# Patient Record
Sex: Male | Born: 1935 | Race: White | Hispanic: No | Marital: Married | Smoking: Never smoker
Health system: Southern US, Community
[De-identification: ages and names within clinical notes are randomized; demographics above are authoritative.]

## PROBLEM LIST (undated history)

## (undated) DIAGNOSIS — I872 Venous insufficiency (chronic) (peripheral): Secondary | ICD-10-CM

## (undated) DIAGNOSIS — I2510 Atherosclerotic heart disease of native coronary artery without angina pectoris: Secondary | ICD-10-CM

## (undated) DIAGNOSIS — I82409 Acute embolism and thrombosis of unspecified deep veins of unspecified lower extremity: Secondary | ICD-10-CM

## (undated) DIAGNOSIS — Z79899 Other long term (current) drug therapy: Secondary | ICD-10-CM

## (undated) DIAGNOSIS — M109 Gout, unspecified: Secondary | ICD-10-CM

## (undated) DIAGNOSIS — D72829 Elevated white blood cell count, unspecified: Secondary | ICD-10-CM

## (undated) DIAGNOSIS — M1990 Unspecified osteoarthritis, unspecified site: Secondary | ICD-10-CM

## (undated) DIAGNOSIS — R3915 Urgency of urination: Secondary | ICD-10-CM

## (undated) DIAGNOSIS — Z951 Presence of aortocoronary bypass graft: Secondary | ICD-10-CM

## (undated) DIAGNOSIS — E785 Hyperlipidemia, unspecified: Secondary | ICD-10-CM

## (undated) DIAGNOSIS — R0600 Dyspnea, unspecified: Secondary | ICD-10-CM

## (undated) DIAGNOSIS — J90 Pleural effusion, not elsewhere classified: Secondary | ICD-10-CM

## (undated) DIAGNOSIS — I10 Essential (primary) hypertension: Secondary | ICD-10-CM

## (undated) DIAGNOSIS — N189 Chronic kidney disease, unspecified: Secondary | ICD-10-CM

## (undated) DIAGNOSIS — E669 Obesity, unspecified: Secondary | ICD-10-CM

## (undated) DIAGNOSIS — D638 Anemia in other chronic diseases classified elsewhere: Secondary | ICD-10-CM

## (undated) DIAGNOSIS — E538 Deficiency of other specified B group vitamins: Secondary | ICD-10-CM

## (undated) DIAGNOSIS — R351 Nocturia: Secondary | ICD-10-CM

## (undated) DIAGNOSIS — I259 Chronic ischemic heart disease, unspecified: Secondary | ICD-10-CM

## (undated) DIAGNOSIS — R350 Frequency of micturition: Secondary | ICD-10-CM

## (undated) DIAGNOSIS — D649 Anemia, unspecified: Secondary | ICD-10-CM

## (undated) DIAGNOSIS — C439 Malignant melanoma of skin, unspecified: Secondary | ICD-10-CM

## (undated) DIAGNOSIS — I444 Left anterior fascicular block: Secondary | ICD-10-CM

## (undated) DIAGNOSIS — I25118 Atherosclerotic heart disease of native coronary artery with other forms of angina pectoris: Secondary | ICD-10-CM

## (undated) DIAGNOSIS — I4510 Unspecified right bundle-branch block: Secondary | ICD-10-CM

## (undated) DIAGNOSIS — I480 Paroxysmal atrial fibrillation: Secondary | ICD-10-CM

## (undated) DIAGNOSIS — I493 Ventricular premature depolarization: Secondary | ICD-10-CM

## (undated) DIAGNOSIS — R9439 Abnormal result of other cardiovascular function study: Secondary | ICD-10-CM

## (undated) HISTORY — DX: Atherosclerotic heart disease of native coronary artery without angina pectoris: I25.10

## (undated) HISTORY — DX: Paroxysmal atrial fibrillation: I48.0

## (undated) HISTORY — PX: CORNEAL TRANSPLANT: SHX108

## (undated) HISTORY — DX: Essential (primary) hypertension: I10

## (undated) HISTORY — DX: Presence of aortocoronary bypass graft: Z95.1

## (undated) HISTORY — PX: COLONOSCOPY: SHX174

## (undated) HISTORY — PX: FOOT SURGERY: SHX648

## (undated) HISTORY — DX: Chronic kidney disease, unspecified: N18.9

## (undated) HISTORY — DX: Anemia in other chronic diseases classified elsewhere: D63.8

## (undated) HISTORY — DX: Gout, unspecified: M10.9

## (undated) HISTORY — DX: Unspecified osteoarthritis, unspecified site: M19.90

## (undated) HISTORY — DX: Malignant melanoma of skin, unspecified: C43.9

## (undated) HISTORY — DX: Elevated white blood cell count, unspecified: D72.829

## (undated) HISTORY — DX: Obesity, unspecified: E66.9

## (undated) HISTORY — DX: Ventricular premature depolarization: I49.3

## (undated) HISTORY — DX: Pleural effusion, not elsewhere classified: J90

## (undated) HISTORY — DX: Hyperlipidemia, unspecified: E78.5

## (undated) HISTORY — DX: Left anterior fascicular block: I44.4

## (undated) HISTORY — DX: Atherosclerotic heart disease of native coronary artery with other forms of angina pectoris: I25.118

## (undated) HISTORY — DX: Abnormal result of other cardiovascular function study: R94.39

## (undated) HISTORY — PX: CARDIAC CATHETERIZATION: SHX172

## (undated) HISTORY — DX: Other long term (current) drug therapy: Z79.899

---

## 2011-08-05 DIAGNOSIS — I10 Essential (primary) hypertension: Secondary | ICD-10-CM | POA: Diagnosis not present

## 2011-08-05 DIAGNOSIS — E785 Hyperlipidemia, unspecified: Secondary | ICD-10-CM | POA: Diagnosis not present

## 2011-08-10 DIAGNOSIS — I10 Essential (primary) hypertension: Secondary | ICD-10-CM | POA: Diagnosis not present

## 2011-08-10 DIAGNOSIS — I4949 Other premature depolarization: Secondary | ICD-10-CM | POA: Diagnosis not present

## 2011-08-10 DIAGNOSIS — E785 Hyperlipidemia, unspecified: Secondary | ICD-10-CM | POA: Diagnosis not present

## 2011-08-19 DIAGNOSIS — Z947 Corneal transplant status: Secondary | ICD-10-CM | POA: Diagnosis not present

## 2011-10-28 DIAGNOSIS — D179 Benign lipomatous neoplasm, unspecified: Secondary | ICD-10-CM | POA: Diagnosis not present

## 2011-10-28 DIAGNOSIS — M109 Gout, unspecified: Secondary | ICD-10-CM | POA: Diagnosis not present

## 2011-11-17 DIAGNOSIS — D1739 Benign lipomatous neoplasm of skin and subcutaneous tissue of other sites: Secondary | ICD-10-CM | POA: Diagnosis not present

## 2011-11-25 DIAGNOSIS — R229 Localized swelling, mass and lump, unspecified: Secondary | ICD-10-CM | POA: Diagnosis not present

## 2011-11-25 DIAGNOSIS — M6740 Ganglion, unspecified site: Secondary | ICD-10-CM | POA: Diagnosis not present

## 2012-01-06 DIAGNOSIS — M1710 Unilateral primary osteoarthritis, unspecified knee: Secondary | ICD-10-CM | POA: Diagnosis not present

## 2012-01-06 DIAGNOSIS — M1A00X0 Idiopathic chronic gout, unspecified site, without tophus (tophi): Secondary | ICD-10-CM | POA: Diagnosis not present

## 2012-05-08 DIAGNOSIS — L570 Actinic keratosis: Secondary | ICD-10-CM | POA: Diagnosis not present

## 2012-05-08 DIAGNOSIS — L821 Other seborrheic keratosis: Secondary | ICD-10-CM | POA: Diagnosis not present

## 2012-05-08 DIAGNOSIS — Z23 Encounter for immunization: Secondary | ICD-10-CM | POA: Diagnosis not present

## 2012-07-06 DIAGNOSIS — I10 Essential (primary) hypertension: Secondary | ICD-10-CM | POA: Diagnosis not present

## 2012-07-10 DIAGNOSIS — E785 Hyperlipidemia, unspecified: Secondary | ICD-10-CM | POA: Diagnosis not present

## 2012-07-10 DIAGNOSIS — I4949 Other premature depolarization: Secondary | ICD-10-CM | POA: Diagnosis not present

## 2012-07-10 DIAGNOSIS — N529 Male erectile dysfunction, unspecified: Secondary | ICD-10-CM | POA: Diagnosis not present

## 2012-07-10 DIAGNOSIS — I4460 Unspecified fascicular block: Secondary | ICD-10-CM | POA: Diagnosis not present

## 2012-07-10 DIAGNOSIS — I10 Essential (primary) hypertension: Secondary | ICD-10-CM | POA: Diagnosis not present

## 2012-07-11 DIAGNOSIS — M19049 Primary osteoarthritis, unspecified hand: Secondary | ICD-10-CM | POA: Diagnosis not present

## 2012-07-11 DIAGNOSIS — M719 Bursopathy, unspecified: Secondary | ICD-10-CM | POA: Diagnosis not present

## 2012-07-11 DIAGNOSIS — M1710 Unilateral primary osteoarthritis, unspecified knee: Secondary | ICD-10-CM | POA: Diagnosis not present

## 2012-07-11 DIAGNOSIS — M1A00X0 Idiopathic chronic gout, unspecified site, without tophus (tophi): Secondary | ICD-10-CM | POA: Diagnosis not present

## 2012-08-17 DIAGNOSIS — Z947 Corneal transplant status: Secondary | ICD-10-CM | POA: Diagnosis not present

## 2012-10-24 DIAGNOSIS — S51809A Unspecified open wound of unspecified forearm, initial encounter: Secondary | ICD-10-CM | POA: Diagnosis not present

## 2012-11-09 DIAGNOSIS — M79609 Pain in unspecified limb: Secondary | ICD-10-CM | POA: Diagnosis not present

## 2013-01-09 DIAGNOSIS — M19049 Primary osteoarthritis, unspecified hand: Secondary | ICD-10-CM | POA: Diagnosis not present

## 2013-01-09 DIAGNOSIS — M719 Bursopathy, unspecified: Secondary | ICD-10-CM | POA: Diagnosis not present

## 2013-01-09 DIAGNOSIS — M1A00X0 Idiopathic chronic gout, unspecified site, without tophus (tophi): Secondary | ICD-10-CM | POA: Diagnosis not present

## 2013-01-09 DIAGNOSIS — M67919 Unspecified disorder of synovium and tendon, unspecified shoulder: Secondary | ICD-10-CM | POA: Diagnosis not present

## 2013-01-09 DIAGNOSIS — M1710 Unilateral primary osteoarthritis, unspecified knee: Secondary | ICD-10-CM | POA: Diagnosis not present

## 2013-04-13 DIAGNOSIS — Z23 Encounter for immunization: Secondary | ICD-10-CM | POA: Diagnosis not present

## 2013-05-08 DIAGNOSIS — Z85820 Personal history of malignant melanoma of skin: Secondary | ICD-10-CM | POA: Diagnosis not present

## 2013-05-08 DIAGNOSIS — L821 Other seborrheic keratosis: Secondary | ICD-10-CM | POA: Diagnosis not present

## 2013-05-08 DIAGNOSIS — L570 Actinic keratosis: Secondary | ICD-10-CM | POA: Diagnosis not present

## 2014-01-21 DIAGNOSIS — E785 Hyperlipidemia, unspecified: Secondary | ICD-10-CM | POA: Diagnosis not present

## 2014-01-22 DIAGNOSIS — M1A00X0 Idiopathic chronic gout, unspecified site, without tophus (tophi): Secondary | ICD-10-CM | POA: Diagnosis not present

## 2014-01-22 DIAGNOSIS — IMO0002 Reserved for concepts with insufficient information to code with codable children: Secondary | ICD-10-CM | POA: Diagnosis not present

## 2014-01-22 DIAGNOSIS — E785 Hyperlipidemia, unspecified: Secondary | ICD-10-CM | POA: Diagnosis not present

## 2014-01-22 DIAGNOSIS — M19049 Primary osteoarthritis, unspecified hand: Secondary | ICD-10-CM | POA: Diagnosis not present

## 2014-01-22 DIAGNOSIS — I10 Essential (primary) hypertension: Secondary | ICD-10-CM | POA: Diagnosis not present

## 2014-01-22 DIAGNOSIS — M67919 Unspecified disorder of synovium and tendon, unspecified shoulder: Secondary | ICD-10-CM | POA: Diagnosis not present

## 2014-01-22 DIAGNOSIS — M1710 Unilateral primary osteoarthritis, unspecified knee: Secondary | ICD-10-CM | POA: Diagnosis not present

## 2014-04-22 DIAGNOSIS — H1130 Conjunctival hemorrhage, unspecified eye: Secondary | ICD-10-CM | POA: Diagnosis not present

## 2014-05-21 DIAGNOSIS — Z23 Encounter for immunization: Secondary | ICD-10-CM | POA: Diagnosis not present

## 2014-05-27 DIAGNOSIS — H43813 Vitreous degeneration, bilateral: Secondary | ICD-10-CM | POA: Diagnosis not present

## 2014-06-24 DIAGNOSIS — Z125 Encounter for screening for malignant neoplasm of prostate: Secondary | ICD-10-CM | POA: Diagnosis not present

## 2014-06-24 DIAGNOSIS — I10 Essential (primary) hypertension: Secondary | ICD-10-CM | POA: Diagnosis not present

## 2014-06-25 DIAGNOSIS — D485 Neoplasm of uncertain behavior of skin: Secondary | ICD-10-CM | POA: Diagnosis not present

## 2014-06-25 DIAGNOSIS — L821 Other seborrheic keratosis: Secondary | ICD-10-CM | POA: Diagnosis not present

## 2014-06-25 DIAGNOSIS — L814 Other melanin hyperpigmentation: Secondary | ICD-10-CM | POA: Diagnosis not present

## 2014-06-28 DIAGNOSIS — Z0000 Encounter for general adult medical examination without abnormal findings: Secondary | ICD-10-CM | POA: Diagnosis not present

## 2014-06-28 DIAGNOSIS — E780 Pure hypercholesterolemia: Secondary | ICD-10-CM | POA: Diagnosis not present

## 2014-06-28 DIAGNOSIS — I10 Essential (primary) hypertension: Secondary | ICD-10-CM | POA: Diagnosis not present

## 2014-06-28 DIAGNOSIS — M1000 Idiopathic gout, unspecified site: Secondary | ICD-10-CM | POA: Diagnosis not present

## 2014-06-28 DIAGNOSIS — Z125 Encounter for screening for malignant neoplasm of prostate: Secondary | ICD-10-CM | POA: Diagnosis not present

## 2014-06-28 DIAGNOSIS — Z23 Encounter for immunization: Secondary | ICD-10-CM | POA: Diagnosis not present

## 2014-08-21 DIAGNOSIS — Z86010 Personal history of colonic polyps: Secondary | ICD-10-CM | POA: Diagnosis not present

## 2014-08-21 DIAGNOSIS — K5790 Diverticulosis of intestine, part unspecified, without perforation or abscess without bleeding: Secondary | ICD-10-CM | POA: Diagnosis not present

## 2014-08-21 DIAGNOSIS — Z1211 Encounter for screening for malignant neoplasm of colon: Secondary | ICD-10-CM | POA: Diagnosis not present

## 2015-05-02 DIAGNOSIS — Z23 Encounter for immunization: Secondary | ICD-10-CM | POA: Diagnosis not present

## 2015-06-02 DIAGNOSIS — H43813 Vitreous degeneration, bilateral: Secondary | ICD-10-CM | POA: Diagnosis not present

## 2015-07-02 DIAGNOSIS — M216X1 Other acquired deformities of right foot: Secondary | ICD-10-CM | POA: Diagnosis not present

## 2015-07-02 DIAGNOSIS — L84 Corns and callosities: Secondary | ICD-10-CM | POA: Diagnosis not present

## 2015-07-02 DIAGNOSIS — M21611 Bunion of right foot: Secondary | ICD-10-CM | POA: Diagnosis not present

## 2015-07-17 DIAGNOSIS — D485 Neoplasm of uncertain behavior of skin: Secondary | ICD-10-CM | POA: Diagnosis not present

## 2015-07-17 DIAGNOSIS — I10 Essential (primary) hypertension: Secondary | ICD-10-CM | POA: Diagnosis not present

## 2015-07-17 DIAGNOSIS — L820 Inflamed seborrheic keratosis: Secondary | ICD-10-CM | POA: Diagnosis not present

## 2015-07-22 DIAGNOSIS — I10 Essential (primary) hypertension: Secondary | ICD-10-CM | POA: Insufficient documentation

## 2015-07-22 DIAGNOSIS — E785 Hyperlipidemia, unspecified: Secondary | ICD-10-CM

## 2015-07-22 HISTORY — DX: Essential (primary) hypertension: I10

## 2015-07-22 HISTORY — DX: Hyperlipidemia, unspecified: E78.5

## 2015-07-23 DIAGNOSIS — Z6824 Body mass index (BMI) 24.0-24.9, adult: Secondary | ICD-10-CM | POA: Diagnosis not present

## 2015-07-23 DIAGNOSIS — I10 Essential (primary) hypertension: Secondary | ICD-10-CM | POA: Diagnosis not present

## 2015-07-23 DIAGNOSIS — E785 Hyperlipidemia, unspecified: Secondary | ICD-10-CM | POA: Diagnosis not present

## 2015-07-23 DIAGNOSIS — R0602 Shortness of breath: Secondary | ICD-10-CM | POA: Diagnosis not present

## 2015-07-27 DIAGNOSIS — R0600 Dyspnea, unspecified: Secondary | ICD-10-CM | POA: Insufficient documentation

## 2015-07-27 HISTORY — DX: Dyspnea, unspecified: R06.00

## 2015-07-29 DIAGNOSIS — C44519 Basal cell carcinoma of skin of other part of trunk: Secondary | ICD-10-CM | POA: Diagnosis not present

## 2015-08-11 DIAGNOSIS — E785 Hyperlipidemia, unspecified: Secondary | ICD-10-CM | POA: Diagnosis not present

## 2015-08-11 DIAGNOSIS — I10 Essential (primary) hypertension: Secondary | ICD-10-CM | POA: Diagnosis not present

## 2015-08-11 DIAGNOSIS — R0602 Shortness of breath: Secondary | ICD-10-CM | POA: Diagnosis not present

## 2015-08-16 DIAGNOSIS — R9439 Abnormal result of other cardiovascular function study: Secondary | ICD-10-CM

## 2015-08-16 HISTORY — DX: Abnormal result of other cardiovascular function study: R94.39

## 2015-08-18 DIAGNOSIS — I700 Atherosclerosis of aorta: Secondary | ICD-10-CM | POA: Diagnosis not present

## 2015-08-18 DIAGNOSIS — Z6824 Body mass index (BMI) 24.0-24.9, adult: Secondary | ICD-10-CM | POA: Diagnosis not present

## 2015-08-18 DIAGNOSIS — Z01810 Encounter for preprocedural cardiovascular examination: Secondary | ICD-10-CM | POA: Diagnosis not present

## 2015-08-18 DIAGNOSIS — R9439 Abnormal result of other cardiovascular function study: Secondary | ICD-10-CM | POA: Diagnosis not present

## 2015-08-18 DIAGNOSIS — Z01818 Encounter for other preprocedural examination: Secondary | ICD-10-CM | POA: Diagnosis not present

## 2015-08-18 DIAGNOSIS — R0602 Shortness of breath: Secondary | ICD-10-CM | POA: Diagnosis not present

## 2015-08-21 DIAGNOSIS — I10 Essential (primary) hypertension: Secondary | ICD-10-CM | POA: Diagnosis not present

## 2015-08-21 DIAGNOSIS — I2510 Atherosclerotic heart disease of native coronary artery without angina pectoris: Secondary | ICD-10-CM | POA: Diagnosis not present

## 2015-08-21 DIAGNOSIS — I444 Left anterior fascicular block: Secondary | ICD-10-CM | POA: Diagnosis not present

## 2015-08-21 DIAGNOSIS — Z7982 Long term (current) use of aspirin: Secondary | ICD-10-CM | POA: Diagnosis not present

## 2015-08-21 DIAGNOSIS — Z9889 Other specified postprocedural states: Secondary | ICD-10-CM | POA: Diagnosis not present

## 2015-08-21 DIAGNOSIS — E785 Hyperlipidemia, unspecified: Secondary | ICD-10-CM | POA: Diagnosis not present

## 2015-08-21 DIAGNOSIS — I501 Left ventricular failure: Secondary | ICD-10-CM | POA: Diagnosis not present

## 2015-08-21 DIAGNOSIS — I25119 Atherosclerotic heart disease of native coronary artery with unspecified angina pectoris: Secondary | ICD-10-CM | POA: Diagnosis not present

## 2015-08-21 DIAGNOSIS — Z79899 Other long term (current) drug therapy: Secondary | ICD-10-CM | POA: Diagnosis not present

## 2015-08-21 DIAGNOSIS — R9439 Abnormal result of other cardiovascular function study: Secondary | ICD-10-CM | POA: Diagnosis not present

## 2015-08-21 DIAGNOSIS — I493 Ventricular premature depolarization: Secondary | ICD-10-CM | POA: Diagnosis not present

## 2015-08-21 DIAGNOSIS — R0602 Shortness of breath: Secondary | ICD-10-CM | POA: Diagnosis not present

## 2015-08-21 DIAGNOSIS — M109 Gout, unspecified: Secondary | ICD-10-CM | POA: Diagnosis not present

## 2015-08-22 DIAGNOSIS — I6523 Occlusion and stenosis of bilateral carotid arteries: Secondary | ICD-10-CM | POA: Diagnosis not present

## 2015-08-22 DIAGNOSIS — I2510 Atherosclerotic heart disease of native coronary artery without angina pectoris: Secondary | ICD-10-CM | POA: Diagnosis not present

## 2015-08-22 DIAGNOSIS — M5134 Other intervertebral disc degeneration, thoracic region: Secondary | ICD-10-CM | POA: Diagnosis not present

## 2015-08-22 DIAGNOSIS — Z01818 Encounter for other preprocedural examination: Secondary | ICD-10-CM | POA: Diagnosis not present

## 2015-08-22 DIAGNOSIS — I081 Rheumatic disorders of both mitral and tricuspid valves: Secondary | ICD-10-CM | POA: Diagnosis not present

## 2015-08-22 DIAGNOSIS — Z01812 Encounter for preprocedural laboratory examination: Secondary | ICD-10-CM | POA: Diagnosis not present

## 2015-08-26 DIAGNOSIS — I4891 Unspecified atrial fibrillation: Secondary | ICD-10-CM | POA: Diagnosis not present

## 2015-08-26 DIAGNOSIS — I480 Paroxysmal atrial fibrillation: Secondary | ICD-10-CM | POA: Diagnosis not present

## 2015-08-26 DIAGNOSIS — E669 Obesity, unspecified: Secondary | ICD-10-CM | POA: Diagnosis present

## 2015-08-26 DIAGNOSIS — R001 Bradycardia, unspecified: Secondary | ICD-10-CM | POA: Diagnosis not present

## 2015-08-26 DIAGNOSIS — J90 Pleural effusion, not elsewhere classified: Secondary | ICD-10-CM | POA: Diagnosis not present

## 2015-08-26 DIAGNOSIS — Z7901 Long term (current) use of anticoagulants: Secondary | ICD-10-CM | POA: Diagnosis not present

## 2015-08-26 DIAGNOSIS — I25118 Atherosclerotic heart disease of native coronary artery with other forms of angina pectoris: Secondary | ICD-10-CM | POA: Diagnosis not present

## 2015-08-26 DIAGNOSIS — M109 Gout, unspecified: Secondary | ICD-10-CM | POA: Diagnosis present

## 2015-08-26 DIAGNOSIS — I499 Cardiac arrhythmia, unspecified: Secondary | ICD-10-CM | POA: Diagnosis not present

## 2015-08-26 DIAGNOSIS — I081 Rheumatic disorders of both mitral and tricuspid valves: Secondary | ICD-10-CM | POA: Diagnosis not present

## 2015-08-26 DIAGNOSIS — Z951 Presence of aortocoronary bypass graft: Secondary | ICD-10-CM | POA: Diagnosis not present

## 2015-08-26 DIAGNOSIS — R609 Edema, unspecified: Secondary | ICD-10-CM | POA: Diagnosis not present

## 2015-08-26 DIAGNOSIS — R5383 Other fatigue: Secondary | ICD-10-CM | POA: Diagnosis not present

## 2015-08-26 DIAGNOSIS — R0609 Other forms of dyspnea: Secondary | ICD-10-CM | POA: Diagnosis not present

## 2015-08-26 DIAGNOSIS — I517 Cardiomegaly: Secondary | ICD-10-CM | POA: Diagnosis not present

## 2015-08-26 DIAGNOSIS — I10 Essential (primary) hypertension: Secondary | ICD-10-CM | POA: Diagnosis not present

## 2015-08-26 DIAGNOSIS — Z947 Corneal transplant status: Secondary | ICD-10-CM | POA: Diagnosis not present

## 2015-08-26 DIAGNOSIS — M1990 Unspecified osteoarthritis, unspecified site: Secondary | ICD-10-CM | POA: Diagnosis present

## 2015-08-26 DIAGNOSIS — R079 Chest pain, unspecified: Secondary | ICD-10-CM | POA: Diagnosis not present

## 2015-08-26 DIAGNOSIS — I25119 Atherosclerotic heart disease of native coronary artery with unspecified angina pectoris: Secondary | ICD-10-CM | POA: Diagnosis not present

## 2015-08-26 DIAGNOSIS — Z6828 Body mass index (BMI) 28.0-28.9, adult: Secondary | ICD-10-CM | POA: Diagnosis not present

## 2015-08-26 DIAGNOSIS — Z6827 Body mass index (BMI) 27.0-27.9, adult: Secondary | ICD-10-CM | POA: Diagnosis not present

## 2015-08-26 DIAGNOSIS — I2510 Atherosclerotic heart disease of native coronary artery without angina pectoris: Secondary | ICD-10-CM | POA: Diagnosis not present

## 2015-08-26 DIAGNOSIS — Z85820 Personal history of malignant melanoma of skin: Secondary | ICD-10-CM | POA: Diagnosis not present

## 2015-08-26 DIAGNOSIS — E785 Hyperlipidemia, unspecified: Secondary | ICD-10-CM | POA: Diagnosis not present

## 2015-08-26 HISTORY — PX: CORONARY ARTERY BYPASS GRAFT: SHX141

## 2015-09-01 DIAGNOSIS — M1990 Unspecified osteoarthritis, unspecified site: Secondary | ICD-10-CM | POA: Diagnosis present

## 2015-09-01 DIAGNOSIS — M109 Gout, unspecified: Secondary | ICD-10-CM | POA: Diagnosis present

## 2015-09-01 DIAGNOSIS — N179 Acute kidney failure, unspecified: Secondary | ICD-10-CM | POA: Diagnosis not present

## 2015-09-01 DIAGNOSIS — I25118 Atherosclerotic heart disease of native coronary artery with other forms of angina pectoris: Secondary | ICD-10-CM

## 2015-09-01 DIAGNOSIS — I2510 Atherosclerotic heart disease of native coronary artery without angina pectoris: Secondary | ICD-10-CM | POA: Diagnosis not present

## 2015-09-01 DIAGNOSIS — I480 Paroxysmal atrial fibrillation: Secondary | ICD-10-CM | POA: Insufficient documentation

## 2015-09-01 DIAGNOSIS — I10 Essential (primary) hypertension: Secondary | ICD-10-CM | POA: Diagnosis not present

## 2015-09-01 DIAGNOSIS — E785 Hyperlipidemia, unspecified: Secondary | ICD-10-CM | POA: Diagnosis not present

## 2015-09-01 DIAGNOSIS — Z48812 Encounter for surgical aftercare following surgery on the circulatory system: Secondary | ICD-10-CM | POA: Diagnosis not present

## 2015-09-01 DIAGNOSIS — Z79899 Other long term (current) drug therapy: Secondary | ICD-10-CM

## 2015-09-01 DIAGNOSIS — K219 Gastro-esophageal reflux disease without esophagitis: Secondary | ICD-10-CM | POA: Diagnosis present

## 2015-09-01 DIAGNOSIS — D649 Anemia, unspecified: Secondary | ICD-10-CM | POA: Diagnosis not present

## 2015-09-01 DIAGNOSIS — J90 Pleural effusion, not elsewhere classified: Secondary | ICD-10-CM | POA: Diagnosis not present

## 2015-09-01 DIAGNOSIS — D72829 Elevated white blood cell count, unspecified: Secondary | ICD-10-CM | POA: Diagnosis not present

## 2015-09-01 DIAGNOSIS — E669 Obesity, unspecified: Secondary | ICD-10-CM | POA: Diagnosis present

## 2015-09-01 DIAGNOSIS — Z6825 Body mass index (BMI) 25.0-25.9, adult: Secondary | ICD-10-CM | POA: Diagnosis not present

## 2015-09-01 DIAGNOSIS — Z951 Presence of aortocoronary bypass graft: Secondary | ICD-10-CM | POA: Diagnosis not present

## 2015-09-01 HISTORY — DX: Paroxysmal atrial fibrillation: I48.0

## 2015-09-01 HISTORY — DX: Atherosclerotic heart disease of native coronary artery with other forms of angina pectoris: I25.118

## 2015-09-01 HISTORY — DX: Other long term (current) drug therapy: Z79.899

## 2015-09-04 DIAGNOSIS — J90 Pleural effusion, not elsewhere classified: Secondary | ICD-10-CM | POA: Diagnosis not present

## 2015-09-05 DIAGNOSIS — N179 Acute kidney failure, unspecified: Secondary | ICD-10-CM | POA: Diagnosis not present

## 2015-09-10 DIAGNOSIS — I2510 Atherosclerotic heart disease of native coronary artery without angina pectoris: Secondary | ICD-10-CM | POA: Diagnosis present

## 2015-09-10 DIAGNOSIS — J90 Pleural effusion, not elsewhere classified: Secondary | ICD-10-CM | POA: Diagnosis not present

## 2015-09-10 DIAGNOSIS — I517 Cardiomegaly: Secondary | ICD-10-CM | POA: Diagnosis not present

## 2015-09-10 DIAGNOSIS — M1990 Unspecified osteoarthritis, unspecified site: Secondary | ICD-10-CM | POA: Diagnosis present

## 2015-09-10 DIAGNOSIS — I480 Paroxysmal atrial fibrillation: Secondary | ICD-10-CM | POA: Diagnosis not present

## 2015-09-10 DIAGNOSIS — J9811 Atelectasis: Secondary | ICD-10-CM | POA: Diagnosis not present

## 2015-09-10 DIAGNOSIS — M109 Gout, unspecified: Secondary | ICD-10-CM | POA: Diagnosis present

## 2015-09-10 DIAGNOSIS — Z79899 Other long term (current) drug therapy: Secondary | ICD-10-CM | POA: Diagnosis not present

## 2015-09-10 DIAGNOSIS — E669 Obesity, unspecified: Secondary | ICD-10-CM | POA: Diagnosis not present

## 2015-09-10 DIAGNOSIS — I25718 Atherosclerosis of autologous vein coronary artery bypass graft(s) with other forms of angina pectoris: Secondary | ICD-10-CM | POA: Diagnosis not present

## 2015-09-10 DIAGNOSIS — Z951 Presence of aortocoronary bypass graft: Secondary | ICD-10-CM | POA: Diagnosis not present

## 2015-09-10 DIAGNOSIS — Z6824 Body mass index (BMI) 24.0-24.9, adult: Secondary | ICD-10-CM | POA: Diagnosis not present

## 2015-09-10 DIAGNOSIS — R0609 Other forms of dyspnea: Secondary | ICD-10-CM | POA: Diagnosis not present

## 2015-09-10 DIAGNOSIS — I081 Rheumatic disorders of both mitral and tricuspid valves: Secondary | ICD-10-CM | POA: Diagnosis not present

## 2015-09-10 DIAGNOSIS — I509 Heart failure, unspecified: Secondary | ICD-10-CM | POA: Diagnosis not present

## 2015-09-10 DIAGNOSIS — Z8249 Family history of ischemic heart disease and other diseases of the circulatory system: Secondary | ICD-10-CM | POA: Diagnosis not present

## 2015-09-10 DIAGNOSIS — I4891 Unspecified atrial fibrillation: Secondary | ICD-10-CM | POA: Diagnosis not present

## 2015-09-10 DIAGNOSIS — J918 Pleural effusion in other conditions classified elsewhere: Secondary | ICD-10-CM | POA: Diagnosis not present

## 2015-09-10 DIAGNOSIS — Z6823 Body mass index (BMI) 23.0-23.9, adult: Secondary | ICD-10-CM | POA: Diagnosis not present

## 2015-09-10 DIAGNOSIS — R0600 Dyspnea, unspecified: Secondary | ICD-10-CM | POA: Diagnosis not present

## 2015-09-10 DIAGNOSIS — Z85820 Personal history of malignant melanoma of skin: Secondary | ICD-10-CM | POA: Diagnosis not present

## 2015-09-10 DIAGNOSIS — I110 Hypertensive heart disease with heart failure: Secondary | ICD-10-CM | POA: Diagnosis not present

## 2015-09-10 DIAGNOSIS — Z7982 Long term (current) use of aspirin: Secondary | ICD-10-CM | POA: Diagnosis not present

## 2015-09-10 DIAGNOSIS — E785 Hyperlipidemia, unspecified: Secondary | ICD-10-CM | POA: Diagnosis not present

## 2015-09-10 DIAGNOSIS — I10 Essential (primary) hypertension: Secondary | ICD-10-CM | POA: Diagnosis not present

## 2015-09-10 DIAGNOSIS — R0602 Shortness of breath: Secondary | ICD-10-CM | POA: Diagnosis not present

## 2015-09-10 DIAGNOSIS — I5031 Acute diastolic (congestive) heart failure: Secondary | ICD-10-CM | POA: Diagnosis not present

## 2015-09-10 DIAGNOSIS — D72829 Elevated white blood cell count, unspecified: Secondary | ICD-10-CM | POA: Diagnosis not present

## 2015-09-15 DIAGNOSIS — M150 Primary generalized (osteo)arthritis: Secondary | ICD-10-CM | POA: Diagnosis not present

## 2015-09-15 DIAGNOSIS — M109 Gout, unspecified: Secondary | ICD-10-CM | POA: Diagnosis not present

## 2015-09-15 DIAGNOSIS — I480 Paroxysmal atrial fibrillation: Secondary | ICD-10-CM | POA: Diagnosis not present

## 2015-09-15 DIAGNOSIS — Z951 Presence of aortocoronary bypass graft: Secondary | ICD-10-CM | POA: Diagnosis not present

## 2015-09-15 DIAGNOSIS — I2510 Atherosclerotic heart disease of native coronary artery without angina pectoris: Secondary | ICD-10-CM | POA: Diagnosis not present

## 2015-09-15 DIAGNOSIS — I10 Essential (primary) hypertension: Secondary | ICD-10-CM | POA: Diagnosis not present

## 2015-09-15 DIAGNOSIS — Z48812 Encounter for surgical aftercare following surgery on the circulatory system: Secondary | ICD-10-CM | POA: Diagnosis not present

## 2015-09-15 DIAGNOSIS — E669 Obesity, unspecified: Secondary | ICD-10-CM | POA: Diagnosis not present

## 2015-09-15 DIAGNOSIS — J90 Pleural effusion, not elsewhere classified: Secondary | ICD-10-CM | POA: Diagnosis not present

## 2015-09-15 DIAGNOSIS — Z7982 Long term (current) use of aspirin: Secondary | ICD-10-CM | POA: Diagnosis not present

## 2015-09-15 DIAGNOSIS — E785 Hyperlipidemia, unspecified: Secondary | ICD-10-CM | POA: Diagnosis not present

## 2015-09-16 DIAGNOSIS — I2510 Atherosclerotic heart disease of native coronary artery without angina pectoris: Secondary | ICD-10-CM | POA: Diagnosis not present

## 2015-09-16 DIAGNOSIS — I480 Paroxysmal atrial fibrillation: Secondary | ICD-10-CM | POA: Diagnosis not present

## 2015-09-16 DIAGNOSIS — M109 Gout, unspecified: Secondary | ICD-10-CM | POA: Diagnosis not present

## 2015-09-16 DIAGNOSIS — Z48812 Encounter for surgical aftercare following surgery on the circulatory system: Secondary | ICD-10-CM | POA: Diagnosis not present

## 2015-09-16 DIAGNOSIS — M150 Primary generalized (osteo)arthritis: Secondary | ICD-10-CM | POA: Diagnosis not present

## 2015-09-16 DIAGNOSIS — J90 Pleural effusion, not elsewhere classified: Secondary | ICD-10-CM | POA: Diagnosis not present

## 2015-09-17 DIAGNOSIS — I2510 Atherosclerotic heart disease of native coronary artery without angina pectoris: Secondary | ICD-10-CM | POA: Diagnosis not present

## 2015-09-17 DIAGNOSIS — M150 Primary generalized (osteo)arthritis: Secondary | ICD-10-CM | POA: Diagnosis not present

## 2015-09-17 DIAGNOSIS — I480 Paroxysmal atrial fibrillation: Secondary | ICD-10-CM | POA: Diagnosis not present

## 2015-09-17 DIAGNOSIS — M109 Gout, unspecified: Secondary | ICD-10-CM | POA: Diagnosis not present

## 2015-09-17 DIAGNOSIS — Z48812 Encounter for surgical aftercare following surgery on the circulatory system: Secondary | ICD-10-CM | POA: Diagnosis not present

## 2015-09-17 DIAGNOSIS — J90 Pleural effusion, not elsewhere classified: Secondary | ICD-10-CM | POA: Diagnosis not present

## 2015-09-18 DIAGNOSIS — Z48812 Encounter for surgical aftercare following surgery on the circulatory system: Secondary | ICD-10-CM | POA: Diagnosis not present

## 2015-09-18 DIAGNOSIS — J90 Pleural effusion, not elsewhere classified: Secondary | ICD-10-CM | POA: Diagnosis not present

## 2015-09-18 DIAGNOSIS — M109 Gout, unspecified: Secondary | ICD-10-CM | POA: Diagnosis not present

## 2015-09-18 DIAGNOSIS — I2510 Atherosclerotic heart disease of native coronary artery without angina pectoris: Secondary | ICD-10-CM | POA: Diagnosis not present

## 2015-09-18 DIAGNOSIS — M150 Primary generalized (osteo)arthritis: Secondary | ICD-10-CM | POA: Diagnosis not present

## 2015-09-18 DIAGNOSIS — I480 Paroxysmal atrial fibrillation: Secondary | ICD-10-CM | POA: Diagnosis not present

## 2015-09-19 DIAGNOSIS — Z1389 Encounter for screening for other disorder: Secondary | ICD-10-CM | POA: Diagnosis not present

## 2015-09-19 DIAGNOSIS — R5383 Other fatigue: Secondary | ICD-10-CM | POA: Diagnosis not present

## 2015-09-19 DIAGNOSIS — M79671 Pain in right foot: Secondary | ICD-10-CM | POA: Diagnosis not present

## 2015-09-19 DIAGNOSIS — Z0000 Encounter for general adult medical examination without abnormal findings: Secondary | ICD-10-CM | POA: Diagnosis not present

## 2015-09-19 DIAGNOSIS — Z9181 History of falling: Secondary | ICD-10-CM | POA: Diagnosis not present

## 2015-09-19 DIAGNOSIS — J209 Acute bronchitis, unspecified: Secondary | ICD-10-CM | POA: Diagnosis not present

## 2015-09-22 DIAGNOSIS — J90 Pleural effusion, not elsewhere classified: Secondary | ICD-10-CM | POA: Insufficient documentation

## 2015-09-22 HISTORY — DX: Pleural effusion, not elsewhere classified: J90

## 2015-09-23 DIAGNOSIS — J90 Pleural effusion, not elsewhere classified: Secondary | ICD-10-CM | POA: Diagnosis not present

## 2015-09-23 DIAGNOSIS — I2510 Atherosclerotic heart disease of native coronary artery without angina pectoris: Secondary | ICD-10-CM | POA: Diagnosis not present

## 2015-09-23 DIAGNOSIS — I480 Paroxysmal atrial fibrillation: Secondary | ICD-10-CM | POA: Diagnosis not present

## 2015-09-23 DIAGNOSIS — M150 Primary generalized (osteo)arthritis: Secondary | ICD-10-CM | POA: Diagnosis not present

## 2015-09-23 DIAGNOSIS — M109 Gout, unspecified: Secondary | ICD-10-CM | POA: Diagnosis not present

## 2015-09-23 DIAGNOSIS — Z48812 Encounter for surgical aftercare following surgery on the circulatory system: Secondary | ICD-10-CM | POA: Diagnosis not present

## 2015-09-24 DIAGNOSIS — Z6823 Body mass index (BMI) 23.0-23.9, adult: Secondary | ICD-10-CM | POA: Diagnosis not present

## 2015-09-24 DIAGNOSIS — I2510 Atherosclerotic heart disease of native coronary artery without angina pectoris: Secondary | ICD-10-CM | POA: Diagnosis not present

## 2015-09-24 HISTORY — PX: CARDIAC CATHETERIZATION: SHX172

## 2015-09-25 DIAGNOSIS — I2510 Atherosclerotic heart disease of native coronary artery without angina pectoris: Secondary | ICD-10-CM | POA: Diagnosis not present

## 2015-09-25 DIAGNOSIS — Z48812 Encounter for surgical aftercare following surgery on the circulatory system: Secondary | ICD-10-CM | POA: Diagnosis not present

## 2015-09-25 DIAGNOSIS — I480 Paroxysmal atrial fibrillation: Secondary | ICD-10-CM | POA: Diagnosis not present

## 2015-09-25 DIAGNOSIS — M150 Primary generalized (osteo)arthritis: Secondary | ICD-10-CM | POA: Diagnosis not present

## 2015-09-25 DIAGNOSIS — M109 Gout, unspecified: Secondary | ICD-10-CM | POA: Diagnosis not present

## 2015-09-25 DIAGNOSIS — J90 Pleural effusion, not elsewhere classified: Secondary | ICD-10-CM | POA: Diagnosis not present

## 2015-09-29 DIAGNOSIS — I2510 Atherosclerotic heart disease of native coronary artery without angina pectoris: Secondary | ICD-10-CM | POA: Diagnosis not present

## 2015-09-29 DIAGNOSIS — R002 Palpitations: Secondary | ICD-10-CM | POA: Diagnosis not present

## 2015-09-30 DIAGNOSIS — R54 Age-related physical debility: Secondary | ICD-10-CM | POA: Diagnosis not present

## 2015-09-30 DIAGNOSIS — I2510 Atherosclerotic heart disease of native coronary artery without angina pectoris: Secondary | ICD-10-CM | POA: Diagnosis not present

## 2015-10-02 DIAGNOSIS — Z951 Presence of aortocoronary bypass graft: Secondary | ICD-10-CM | POA: Diagnosis not present

## 2015-10-06 DIAGNOSIS — Z951 Presence of aortocoronary bypass graft: Secondary | ICD-10-CM | POA: Diagnosis not present

## 2015-10-08 DIAGNOSIS — Z951 Presence of aortocoronary bypass graft: Secondary | ICD-10-CM | POA: Diagnosis not present

## 2015-10-13 DIAGNOSIS — Z951 Presence of aortocoronary bypass graft: Secondary | ICD-10-CM | POA: Diagnosis not present

## 2015-10-15 DIAGNOSIS — Z951 Presence of aortocoronary bypass graft: Secondary | ICD-10-CM | POA: Diagnosis not present

## 2015-10-20 DIAGNOSIS — M1990 Unspecified osteoarthritis, unspecified site: Secondary | ICD-10-CM | POA: Diagnosis not present

## 2015-10-20 DIAGNOSIS — I259 Chronic ischemic heart disease, unspecified: Secondary | ICD-10-CM | POA: Diagnosis not present

## 2015-10-20 DIAGNOSIS — N289 Disorder of kidney and ureter, unspecified: Secondary | ICD-10-CM | POA: Diagnosis not present

## 2015-10-20 DIAGNOSIS — Z951 Presence of aortocoronary bypass graft: Secondary | ICD-10-CM | POA: Diagnosis not present

## 2015-10-20 DIAGNOSIS — D649 Anemia, unspecified: Secondary | ICD-10-CM | POA: Diagnosis not present

## 2015-10-22 DIAGNOSIS — Z951 Presence of aortocoronary bypass graft: Secondary | ICD-10-CM | POA: Diagnosis not present

## 2015-10-24 DIAGNOSIS — Z951 Presence of aortocoronary bypass graft: Secondary | ICD-10-CM | POA: Diagnosis not present

## 2015-10-29 DIAGNOSIS — Z951 Presence of aortocoronary bypass graft: Secondary | ICD-10-CM | POA: Diagnosis not present

## 2015-10-29 DIAGNOSIS — Z79899 Other long term (current) drug therapy: Secondary | ICD-10-CM | POA: Diagnosis not present

## 2015-10-29 DIAGNOSIS — I4891 Unspecified atrial fibrillation: Secondary | ICD-10-CM | POA: Diagnosis not present

## 2015-10-29 DIAGNOSIS — Z7982 Long term (current) use of aspirin: Secondary | ICD-10-CM | POA: Diagnosis not present

## 2015-10-29 DIAGNOSIS — I10 Essential (primary) hypertension: Secondary | ICD-10-CM | POA: Diagnosis not present

## 2015-10-29 DIAGNOSIS — E785 Hyperlipidemia, unspecified: Secondary | ICD-10-CM | POA: Diagnosis not present

## 2015-10-29 DIAGNOSIS — I25810 Atherosclerosis of coronary artery bypass graft(s) without angina pectoris: Secondary | ICD-10-CM | POA: Diagnosis not present

## 2015-10-31 DIAGNOSIS — Z7982 Long term (current) use of aspirin: Secondary | ICD-10-CM | POA: Diagnosis not present

## 2015-10-31 DIAGNOSIS — E785 Hyperlipidemia, unspecified: Secondary | ICD-10-CM | POA: Diagnosis not present

## 2015-10-31 DIAGNOSIS — I10 Essential (primary) hypertension: Secondary | ICD-10-CM | POA: Diagnosis not present

## 2015-10-31 DIAGNOSIS — Z951 Presence of aortocoronary bypass graft: Secondary | ICD-10-CM | POA: Diagnosis not present

## 2015-10-31 DIAGNOSIS — I4891 Unspecified atrial fibrillation: Secondary | ICD-10-CM | POA: Diagnosis not present

## 2015-10-31 DIAGNOSIS — I25810 Atherosclerosis of coronary artery bypass graft(s) without angina pectoris: Secondary | ICD-10-CM | POA: Diagnosis not present

## 2015-11-03 DIAGNOSIS — I25810 Atherosclerosis of coronary artery bypass graft(s) without angina pectoris: Secondary | ICD-10-CM | POA: Diagnosis not present

## 2015-11-03 DIAGNOSIS — I10 Essential (primary) hypertension: Secondary | ICD-10-CM | POA: Diagnosis not present

## 2015-11-03 DIAGNOSIS — I2510 Atherosclerotic heart disease of native coronary artery without angina pectoris: Secondary | ICD-10-CM | POA: Diagnosis not present

## 2015-11-03 DIAGNOSIS — E785 Hyperlipidemia, unspecified: Secondary | ICD-10-CM | POA: Diagnosis not present

## 2015-11-03 DIAGNOSIS — I4891 Unspecified atrial fibrillation: Secondary | ICD-10-CM | POA: Diagnosis not present

## 2015-11-03 DIAGNOSIS — Z7982 Long term (current) use of aspirin: Secondary | ICD-10-CM | POA: Diagnosis not present

## 2015-11-03 DIAGNOSIS — Z951 Presence of aortocoronary bypass graft: Secondary | ICD-10-CM | POA: Diagnosis not present

## 2015-11-05 DIAGNOSIS — I10 Essential (primary) hypertension: Secondary | ICD-10-CM | POA: Diagnosis not present

## 2015-11-05 DIAGNOSIS — Z951 Presence of aortocoronary bypass graft: Secondary | ICD-10-CM | POA: Diagnosis not present

## 2015-11-05 DIAGNOSIS — I25810 Atherosclerosis of coronary artery bypass graft(s) without angina pectoris: Secondary | ICD-10-CM | POA: Diagnosis not present

## 2015-11-05 DIAGNOSIS — E785 Hyperlipidemia, unspecified: Secondary | ICD-10-CM | POA: Diagnosis not present

## 2015-11-05 DIAGNOSIS — Z7982 Long term (current) use of aspirin: Secondary | ICD-10-CM | POA: Diagnosis not present

## 2015-11-05 DIAGNOSIS — I4891 Unspecified atrial fibrillation: Secondary | ICD-10-CM | POA: Diagnosis not present

## 2015-11-06 DIAGNOSIS — I10 Essential (primary) hypertension: Secondary | ICD-10-CM | POA: Diagnosis not present

## 2015-11-06 DIAGNOSIS — Z7982 Long term (current) use of aspirin: Secondary | ICD-10-CM | POA: Diagnosis not present

## 2015-11-06 DIAGNOSIS — I4891 Unspecified atrial fibrillation: Secondary | ICD-10-CM | POA: Diagnosis not present

## 2015-11-06 DIAGNOSIS — Z951 Presence of aortocoronary bypass graft: Secondary | ICD-10-CM | POA: Diagnosis not present

## 2015-11-06 DIAGNOSIS — E785 Hyperlipidemia, unspecified: Secondary | ICD-10-CM | POA: Diagnosis not present

## 2015-11-06 DIAGNOSIS — I25810 Atherosclerosis of coronary artery bypass graft(s) without angina pectoris: Secondary | ICD-10-CM | POA: Diagnosis not present

## 2015-11-12 DIAGNOSIS — E785 Hyperlipidemia, unspecified: Secondary | ICD-10-CM | POA: Diagnosis not present

## 2015-11-12 DIAGNOSIS — Z951 Presence of aortocoronary bypass graft: Secondary | ICD-10-CM | POA: Diagnosis not present

## 2015-11-12 DIAGNOSIS — Z7982 Long term (current) use of aspirin: Secondary | ICD-10-CM | POA: Diagnosis not present

## 2015-11-12 DIAGNOSIS — I4891 Unspecified atrial fibrillation: Secondary | ICD-10-CM | POA: Diagnosis not present

## 2015-11-12 DIAGNOSIS — I25810 Atherosclerosis of coronary artery bypass graft(s) without angina pectoris: Secondary | ICD-10-CM | POA: Diagnosis not present

## 2015-11-12 DIAGNOSIS — I10 Essential (primary) hypertension: Secondary | ICD-10-CM | POA: Diagnosis not present

## 2015-11-14 DIAGNOSIS — Z951 Presence of aortocoronary bypass graft: Secondary | ICD-10-CM | POA: Diagnosis not present

## 2015-11-14 DIAGNOSIS — I4891 Unspecified atrial fibrillation: Secondary | ICD-10-CM | POA: Diagnosis not present

## 2015-11-14 DIAGNOSIS — Z7982 Long term (current) use of aspirin: Secondary | ICD-10-CM | POA: Diagnosis not present

## 2015-11-14 DIAGNOSIS — I25810 Atherosclerosis of coronary artery bypass graft(s) without angina pectoris: Secondary | ICD-10-CM | POA: Diagnosis not present

## 2015-11-14 DIAGNOSIS — I10 Essential (primary) hypertension: Secondary | ICD-10-CM | POA: Diagnosis not present

## 2015-11-14 DIAGNOSIS — E785 Hyperlipidemia, unspecified: Secondary | ICD-10-CM | POA: Diagnosis not present

## 2015-11-17 DIAGNOSIS — I25810 Atherosclerosis of coronary artery bypass graft(s) without angina pectoris: Secondary | ICD-10-CM | POA: Diagnosis not present

## 2015-11-17 DIAGNOSIS — E785 Hyperlipidemia, unspecified: Secondary | ICD-10-CM | POA: Diagnosis not present

## 2015-11-17 DIAGNOSIS — I10 Essential (primary) hypertension: Secondary | ICD-10-CM | POA: Diagnosis not present

## 2015-11-17 DIAGNOSIS — I4891 Unspecified atrial fibrillation: Secondary | ICD-10-CM | POA: Diagnosis not present

## 2015-11-17 DIAGNOSIS — Z951 Presence of aortocoronary bypass graft: Secondary | ICD-10-CM | POA: Diagnosis not present

## 2015-11-17 DIAGNOSIS — Z7982 Long term (current) use of aspirin: Secondary | ICD-10-CM | POA: Diagnosis not present

## 2015-11-19 DIAGNOSIS — I4891 Unspecified atrial fibrillation: Secondary | ICD-10-CM | POA: Diagnosis not present

## 2015-11-19 DIAGNOSIS — I10 Essential (primary) hypertension: Secondary | ICD-10-CM | POA: Diagnosis not present

## 2015-11-19 DIAGNOSIS — Z7982 Long term (current) use of aspirin: Secondary | ICD-10-CM | POA: Diagnosis not present

## 2015-11-19 DIAGNOSIS — I25810 Atherosclerosis of coronary artery bypass graft(s) without angina pectoris: Secondary | ICD-10-CM | POA: Diagnosis not present

## 2015-11-19 DIAGNOSIS — Z951 Presence of aortocoronary bypass graft: Secondary | ICD-10-CM | POA: Diagnosis not present

## 2015-11-19 DIAGNOSIS — E785 Hyperlipidemia, unspecified: Secondary | ICD-10-CM | POA: Diagnosis not present

## 2015-11-21 DIAGNOSIS — E785 Hyperlipidemia, unspecified: Secondary | ICD-10-CM | POA: Diagnosis not present

## 2015-11-21 DIAGNOSIS — I10 Essential (primary) hypertension: Secondary | ICD-10-CM | POA: Diagnosis not present

## 2015-11-21 DIAGNOSIS — I4891 Unspecified atrial fibrillation: Secondary | ICD-10-CM | POA: Diagnosis not present

## 2015-11-21 DIAGNOSIS — I25810 Atherosclerosis of coronary artery bypass graft(s) without angina pectoris: Secondary | ICD-10-CM | POA: Diagnosis not present

## 2015-11-21 DIAGNOSIS — Z951 Presence of aortocoronary bypass graft: Secondary | ICD-10-CM | POA: Diagnosis not present

## 2015-11-21 DIAGNOSIS — Z7982 Long term (current) use of aspirin: Secondary | ICD-10-CM | POA: Diagnosis not present

## 2015-11-24 DIAGNOSIS — I4891 Unspecified atrial fibrillation: Secondary | ICD-10-CM | POA: Diagnosis not present

## 2015-11-24 DIAGNOSIS — D649 Anemia, unspecified: Secondary | ICD-10-CM | POA: Diagnosis not present

## 2015-11-24 DIAGNOSIS — Z951 Presence of aortocoronary bypass graft: Secondary | ICD-10-CM | POA: Diagnosis not present

## 2015-11-24 DIAGNOSIS — I10 Essential (primary) hypertension: Secondary | ICD-10-CM | POA: Diagnosis not present

## 2015-11-24 DIAGNOSIS — Z79899 Other long term (current) drug therapy: Secondary | ICD-10-CM | POA: Diagnosis not present

## 2015-11-24 DIAGNOSIS — E785 Hyperlipidemia, unspecified: Secondary | ICD-10-CM | POA: Diagnosis not present

## 2015-11-24 DIAGNOSIS — Z7982 Long term (current) use of aspirin: Secondary | ICD-10-CM | POA: Diagnosis not present

## 2015-11-24 DIAGNOSIS — I25810 Atherosclerosis of coronary artery bypass graft(s) without angina pectoris: Secondary | ICD-10-CM | POA: Diagnosis not present

## 2015-11-25 DIAGNOSIS — M150 Primary generalized (osteo)arthritis: Secondary | ICD-10-CM | POA: Diagnosis not present

## 2015-11-25 DIAGNOSIS — M25561 Pain in right knee: Secondary | ICD-10-CM | POA: Diagnosis not present

## 2015-11-25 DIAGNOSIS — N183 Chronic kidney disease, stage 3 (moderate): Secondary | ICD-10-CM | POA: Diagnosis not present

## 2015-11-25 DIAGNOSIS — M25562 Pain in left knee: Secondary | ICD-10-CM | POA: Diagnosis not present

## 2015-11-25 DIAGNOSIS — M1A09X0 Idiopathic chronic gout, multiple sites, without tophus (tophi): Secondary | ICD-10-CM | POA: Diagnosis not present

## 2015-11-27 DIAGNOSIS — L821 Other seborrheic keratosis: Secondary | ICD-10-CM | POA: Diagnosis not present

## 2015-11-27 DIAGNOSIS — L570 Actinic keratosis: Secondary | ICD-10-CM | POA: Diagnosis not present

## 2015-11-28 DIAGNOSIS — I4891 Unspecified atrial fibrillation: Secondary | ICD-10-CM | POA: Diagnosis not present

## 2015-11-28 DIAGNOSIS — I25810 Atherosclerosis of coronary artery bypass graft(s) without angina pectoris: Secondary | ICD-10-CM | POA: Diagnosis not present

## 2015-11-28 DIAGNOSIS — Z951 Presence of aortocoronary bypass graft: Secondary | ICD-10-CM | POA: Diagnosis not present

## 2015-11-28 DIAGNOSIS — Z7982 Long term (current) use of aspirin: Secondary | ICD-10-CM | POA: Diagnosis not present

## 2015-11-28 DIAGNOSIS — I10 Essential (primary) hypertension: Secondary | ICD-10-CM | POA: Diagnosis not present

## 2015-11-28 DIAGNOSIS — E785 Hyperlipidemia, unspecified: Secondary | ICD-10-CM | POA: Diagnosis not present

## 2015-12-01 DIAGNOSIS — I4891 Unspecified atrial fibrillation: Secondary | ICD-10-CM | POA: Diagnosis not present

## 2015-12-01 DIAGNOSIS — I25810 Atherosclerosis of coronary artery bypass graft(s) without angina pectoris: Secondary | ICD-10-CM | POA: Diagnosis not present

## 2015-12-01 DIAGNOSIS — Z951 Presence of aortocoronary bypass graft: Secondary | ICD-10-CM | POA: Diagnosis not present

## 2015-12-01 DIAGNOSIS — E785 Hyperlipidemia, unspecified: Secondary | ICD-10-CM | POA: Diagnosis not present

## 2015-12-01 DIAGNOSIS — Z7982 Long term (current) use of aspirin: Secondary | ICD-10-CM | POA: Diagnosis not present

## 2015-12-01 DIAGNOSIS — I10 Essential (primary) hypertension: Secondary | ICD-10-CM | POA: Diagnosis not present

## 2015-12-03 DIAGNOSIS — I4891 Unspecified atrial fibrillation: Secondary | ICD-10-CM | POA: Diagnosis not present

## 2015-12-03 DIAGNOSIS — I25810 Atherosclerosis of coronary artery bypass graft(s) without angina pectoris: Secondary | ICD-10-CM | POA: Diagnosis not present

## 2015-12-03 DIAGNOSIS — E785 Hyperlipidemia, unspecified: Secondary | ICD-10-CM | POA: Diagnosis not present

## 2015-12-03 DIAGNOSIS — Z7982 Long term (current) use of aspirin: Secondary | ICD-10-CM | POA: Diagnosis not present

## 2015-12-03 DIAGNOSIS — Z951 Presence of aortocoronary bypass graft: Secondary | ICD-10-CM | POA: Diagnosis not present

## 2015-12-03 DIAGNOSIS — I10 Essential (primary) hypertension: Secondary | ICD-10-CM | POA: Diagnosis not present

## 2015-12-05 DIAGNOSIS — I10 Essential (primary) hypertension: Secondary | ICD-10-CM | POA: Diagnosis not present

## 2015-12-05 DIAGNOSIS — E785 Hyperlipidemia, unspecified: Secondary | ICD-10-CM | POA: Diagnosis not present

## 2015-12-05 DIAGNOSIS — I25810 Atherosclerosis of coronary artery bypass graft(s) without angina pectoris: Secondary | ICD-10-CM | POA: Diagnosis not present

## 2015-12-05 DIAGNOSIS — Z951 Presence of aortocoronary bypass graft: Secondary | ICD-10-CM | POA: Diagnosis not present

## 2015-12-05 DIAGNOSIS — Z7982 Long term (current) use of aspirin: Secondary | ICD-10-CM | POA: Diagnosis not present

## 2015-12-05 DIAGNOSIS — I4891 Unspecified atrial fibrillation: Secondary | ICD-10-CM | POA: Diagnosis not present

## 2015-12-08 DIAGNOSIS — I25810 Atherosclerosis of coronary artery bypass graft(s) without angina pectoris: Secondary | ICD-10-CM | POA: Diagnosis not present

## 2015-12-08 DIAGNOSIS — I4891 Unspecified atrial fibrillation: Secondary | ICD-10-CM | POA: Diagnosis not present

## 2015-12-08 DIAGNOSIS — I10 Essential (primary) hypertension: Secondary | ICD-10-CM | POA: Diagnosis not present

## 2015-12-08 DIAGNOSIS — E785 Hyperlipidemia, unspecified: Secondary | ICD-10-CM | POA: Diagnosis not present

## 2015-12-08 DIAGNOSIS — Z951 Presence of aortocoronary bypass graft: Secondary | ICD-10-CM | POA: Diagnosis not present

## 2015-12-08 DIAGNOSIS — Z7982 Long term (current) use of aspirin: Secondary | ICD-10-CM | POA: Diagnosis not present

## 2015-12-17 DIAGNOSIS — Z951 Presence of aortocoronary bypass graft: Secondary | ICD-10-CM | POA: Diagnosis not present

## 2015-12-17 DIAGNOSIS — Z7982 Long term (current) use of aspirin: Secondary | ICD-10-CM | POA: Diagnosis not present

## 2015-12-17 DIAGNOSIS — E785 Hyperlipidemia, unspecified: Secondary | ICD-10-CM | POA: Diagnosis not present

## 2015-12-17 DIAGNOSIS — I4891 Unspecified atrial fibrillation: Secondary | ICD-10-CM | POA: Diagnosis not present

## 2015-12-17 DIAGNOSIS — I25810 Atherosclerosis of coronary artery bypass graft(s) without angina pectoris: Secondary | ICD-10-CM | POA: Diagnosis not present

## 2015-12-17 DIAGNOSIS — I10 Essential (primary) hypertension: Secondary | ICD-10-CM | POA: Diagnosis not present

## 2015-12-19 DIAGNOSIS — Z951 Presence of aortocoronary bypass graft: Secondary | ICD-10-CM | POA: Diagnosis not present

## 2015-12-19 DIAGNOSIS — Z7982 Long term (current) use of aspirin: Secondary | ICD-10-CM | POA: Diagnosis not present

## 2015-12-19 DIAGNOSIS — I10 Essential (primary) hypertension: Secondary | ICD-10-CM | POA: Diagnosis not present

## 2015-12-19 DIAGNOSIS — E785 Hyperlipidemia, unspecified: Secondary | ICD-10-CM | POA: Diagnosis not present

## 2015-12-19 DIAGNOSIS — I4891 Unspecified atrial fibrillation: Secondary | ICD-10-CM | POA: Diagnosis not present

## 2015-12-19 DIAGNOSIS — I25810 Atherosclerosis of coronary artery bypass graft(s) without angina pectoris: Secondary | ICD-10-CM | POA: Diagnosis not present

## 2016-01-02 DIAGNOSIS — I10 Essential (primary) hypertension: Secondary | ICD-10-CM | POA: Diagnosis not present

## 2016-01-02 DIAGNOSIS — I4891 Unspecified atrial fibrillation: Secondary | ICD-10-CM | POA: Diagnosis not present

## 2016-01-02 DIAGNOSIS — E785 Hyperlipidemia, unspecified: Secondary | ICD-10-CM | POA: Diagnosis not present

## 2016-01-02 DIAGNOSIS — Z7982 Long term (current) use of aspirin: Secondary | ICD-10-CM | POA: Diagnosis not present

## 2016-01-02 DIAGNOSIS — I25810 Atherosclerosis of coronary artery bypass graft(s) without angina pectoris: Secondary | ICD-10-CM | POA: Diagnosis not present

## 2016-01-02 DIAGNOSIS — Z79899 Other long term (current) drug therapy: Secondary | ICD-10-CM | POA: Diagnosis not present

## 2016-01-05 DIAGNOSIS — Z7982 Long term (current) use of aspirin: Secondary | ICD-10-CM | POA: Diagnosis not present

## 2016-01-05 DIAGNOSIS — I10 Essential (primary) hypertension: Secondary | ICD-10-CM | POA: Diagnosis not present

## 2016-01-05 DIAGNOSIS — Z79899 Other long term (current) drug therapy: Secondary | ICD-10-CM | POA: Diagnosis not present

## 2016-01-05 DIAGNOSIS — I25810 Atherosclerosis of coronary artery bypass graft(s) without angina pectoris: Secondary | ICD-10-CM | POA: Diagnosis not present

## 2016-01-05 DIAGNOSIS — E785 Hyperlipidemia, unspecified: Secondary | ICD-10-CM | POA: Diagnosis not present

## 2016-01-05 DIAGNOSIS — I4891 Unspecified atrial fibrillation: Secondary | ICD-10-CM | POA: Diagnosis not present

## 2016-01-13 DIAGNOSIS — I872 Venous insufficiency (chronic) (peripheral): Secondary | ICD-10-CM | POA: Diagnosis not present

## 2016-01-13 DIAGNOSIS — I259 Chronic ischemic heart disease, unspecified: Secondary | ICD-10-CM | POA: Diagnosis not present

## 2016-01-13 DIAGNOSIS — J309 Allergic rhinitis, unspecified: Secondary | ICD-10-CM | POA: Diagnosis not present

## 2016-01-13 DIAGNOSIS — M1990 Unspecified osteoarthritis, unspecified site: Secondary | ICD-10-CM | POA: Diagnosis not present

## 2016-01-14 DIAGNOSIS — Z79899 Other long term (current) drug therapy: Secondary | ICD-10-CM | POA: Diagnosis not present

## 2016-01-14 DIAGNOSIS — I10 Essential (primary) hypertension: Secondary | ICD-10-CM | POA: Diagnosis not present

## 2016-01-14 DIAGNOSIS — I4891 Unspecified atrial fibrillation: Secondary | ICD-10-CM | POA: Diagnosis not present

## 2016-01-14 DIAGNOSIS — E785 Hyperlipidemia, unspecified: Secondary | ICD-10-CM | POA: Diagnosis not present

## 2016-01-14 DIAGNOSIS — I25810 Atherosclerosis of coronary artery bypass graft(s) without angina pectoris: Secondary | ICD-10-CM | POA: Diagnosis not present

## 2016-01-14 DIAGNOSIS — Z7982 Long term (current) use of aspirin: Secondary | ICD-10-CM | POA: Diagnosis not present

## 2016-01-16 DIAGNOSIS — Z79899 Other long term (current) drug therapy: Secondary | ICD-10-CM | POA: Diagnosis not present

## 2016-01-16 DIAGNOSIS — I25810 Atherosclerosis of coronary artery bypass graft(s) without angina pectoris: Secondary | ICD-10-CM | POA: Diagnosis not present

## 2016-01-16 DIAGNOSIS — E785 Hyperlipidemia, unspecified: Secondary | ICD-10-CM | POA: Diagnosis not present

## 2016-01-16 DIAGNOSIS — I10 Essential (primary) hypertension: Secondary | ICD-10-CM | POA: Diagnosis not present

## 2016-01-16 DIAGNOSIS — Z7982 Long term (current) use of aspirin: Secondary | ICD-10-CM | POA: Diagnosis not present

## 2016-01-16 DIAGNOSIS — I4891 Unspecified atrial fibrillation: Secondary | ICD-10-CM | POA: Diagnosis not present

## 2016-01-19 DIAGNOSIS — Z7982 Long term (current) use of aspirin: Secondary | ICD-10-CM | POA: Diagnosis not present

## 2016-01-19 DIAGNOSIS — Z79899 Other long term (current) drug therapy: Secondary | ICD-10-CM | POA: Diagnosis not present

## 2016-01-19 DIAGNOSIS — E785 Hyperlipidemia, unspecified: Secondary | ICD-10-CM | POA: Diagnosis not present

## 2016-01-19 DIAGNOSIS — I25810 Atherosclerosis of coronary artery bypass graft(s) without angina pectoris: Secondary | ICD-10-CM | POA: Diagnosis not present

## 2016-01-19 DIAGNOSIS — I4891 Unspecified atrial fibrillation: Secondary | ICD-10-CM | POA: Diagnosis not present

## 2016-01-19 DIAGNOSIS — I10 Essential (primary) hypertension: Secondary | ICD-10-CM | POA: Diagnosis not present

## 2016-01-21 DIAGNOSIS — E785 Hyperlipidemia, unspecified: Secondary | ICD-10-CM | POA: Diagnosis not present

## 2016-01-21 DIAGNOSIS — Z79899 Other long term (current) drug therapy: Secondary | ICD-10-CM | POA: Diagnosis not present

## 2016-01-21 DIAGNOSIS — I4891 Unspecified atrial fibrillation: Secondary | ICD-10-CM | POA: Diagnosis not present

## 2016-01-21 DIAGNOSIS — Z7982 Long term (current) use of aspirin: Secondary | ICD-10-CM | POA: Diagnosis not present

## 2016-01-21 DIAGNOSIS — I10 Essential (primary) hypertension: Secondary | ICD-10-CM | POA: Diagnosis not present

## 2016-01-21 DIAGNOSIS — I25810 Atherosclerosis of coronary artery bypass graft(s) without angina pectoris: Secondary | ICD-10-CM | POA: Diagnosis not present

## 2016-01-23 DIAGNOSIS — I25810 Atherosclerosis of coronary artery bypass graft(s) without angina pectoris: Secondary | ICD-10-CM | POA: Diagnosis not present

## 2016-01-23 DIAGNOSIS — I4891 Unspecified atrial fibrillation: Secondary | ICD-10-CM | POA: Diagnosis not present

## 2016-01-23 DIAGNOSIS — E785 Hyperlipidemia, unspecified: Secondary | ICD-10-CM | POA: Diagnosis not present

## 2016-01-23 DIAGNOSIS — I10 Essential (primary) hypertension: Secondary | ICD-10-CM | POA: Diagnosis not present

## 2016-01-23 DIAGNOSIS — Z7982 Long term (current) use of aspirin: Secondary | ICD-10-CM | POA: Diagnosis not present

## 2016-01-23 DIAGNOSIS — Z79899 Other long term (current) drug therapy: Secondary | ICD-10-CM | POA: Diagnosis not present

## 2016-01-26 DIAGNOSIS — Z955 Presence of coronary angioplasty implant and graft: Secondary | ICD-10-CM | POA: Diagnosis not present

## 2016-02-24 DIAGNOSIS — C44622 Squamous cell carcinoma of skin of right upper limb, including shoulder: Secondary | ICD-10-CM | POA: Diagnosis not present

## 2016-04-09 DIAGNOSIS — Z0000 Encounter for general adult medical examination without abnormal findings: Secondary | ICD-10-CM | POA: Diagnosis not present

## 2016-04-09 DIAGNOSIS — L739 Follicular disorder, unspecified: Secondary | ICD-10-CM | POA: Diagnosis not present

## 2016-05-04 DIAGNOSIS — I2510 Atherosclerotic heart disease of native coronary artery without angina pectoris: Secondary | ICD-10-CM | POA: Diagnosis not present

## 2016-05-04 DIAGNOSIS — I4891 Unspecified atrial fibrillation: Secondary | ICD-10-CM | POA: Diagnosis not present

## 2016-05-05 DIAGNOSIS — I10 Essential (primary) hypertension: Secondary | ICD-10-CM | POA: Diagnosis not present

## 2016-05-05 DIAGNOSIS — I2510 Atherosclerotic heart disease of native coronary artery without angina pectoris: Secondary | ICD-10-CM | POA: Diagnosis not present

## 2016-05-05 DIAGNOSIS — E785 Hyperlipidemia, unspecified: Secondary | ICD-10-CM | POA: Diagnosis not present

## 2016-05-31 DIAGNOSIS — M1A09X0 Idiopathic chronic gout, multiple sites, without tophus (tophi): Secondary | ICD-10-CM | POA: Diagnosis not present

## 2016-05-31 DIAGNOSIS — M150 Primary generalized (osteo)arthritis: Secondary | ICD-10-CM | POA: Diagnosis not present

## 2016-05-31 DIAGNOSIS — M25561 Pain in right knee: Secondary | ICD-10-CM | POA: Diagnosis not present

## 2016-05-31 DIAGNOSIS — N183 Chronic kidney disease, stage 3 (moderate): Secondary | ICD-10-CM | POA: Diagnosis not present

## 2016-06-23 DIAGNOSIS — H43813 Vitreous degeneration, bilateral: Secondary | ICD-10-CM | POA: Diagnosis not present

## 2016-06-28 DIAGNOSIS — M47812 Spondylosis without myelopathy or radiculopathy, cervical region: Secondary | ICD-10-CM | POA: Diagnosis not present

## 2016-06-28 DIAGNOSIS — R51 Headache: Secondary | ICD-10-CM | POA: Diagnosis not present

## 2016-06-28 DIAGNOSIS — D649 Anemia, unspecified: Secondary | ICD-10-CM | POA: Diagnosis not present

## 2016-11-03 DIAGNOSIS — I2510 Atherosclerotic heart disease of native coronary artery without angina pectoris: Secondary | ICD-10-CM | POA: Diagnosis not present

## 2016-11-03 DIAGNOSIS — E785 Hyperlipidemia, unspecified: Secondary | ICD-10-CM | POA: Diagnosis not present

## 2016-11-03 DIAGNOSIS — R5381 Other malaise: Secondary | ICD-10-CM | POA: Diagnosis not present

## 2016-11-03 DIAGNOSIS — I10 Essential (primary) hypertension: Secondary | ICD-10-CM | POA: Diagnosis not present

## 2016-11-03 DIAGNOSIS — Z6822 Body mass index (BMI) 22.0-22.9, adult: Secondary | ICD-10-CM | POA: Diagnosis not present

## 2016-11-18 DIAGNOSIS — I4891 Unspecified atrial fibrillation: Secondary | ICD-10-CM | POA: Diagnosis not present

## 2016-11-29 DIAGNOSIS — M1A09X0 Idiopathic chronic gout, multiple sites, without tophus (tophi): Secondary | ICD-10-CM | POA: Diagnosis not present

## 2016-11-29 DIAGNOSIS — N183 Chronic kidney disease, stage 3 (moderate): Secondary | ICD-10-CM | POA: Diagnosis not present

## 2016-11-29 DIAGNOSIS — Z6823 Body mass index (BMI) 23.0-23.9, adult: Secondary | ICD-10-CM | POA: Diagnosis not present

## 2016-11-29 DIAGNOSIS — M150 Primary generalized (osteo)arthritis: Secondary | ICD-10-CM | POA: Diagnosis not present

## 2016-11-30 DIAGNOSIS — L570 Actinic keratosis: Secondary | ICD-10-CM | POA: Diagnosis not present

## 2016-11-30 DIAGNOSIS — L821 Other seborrheic keratosis: Secondary | ICD-10-CM | POA: Diagnosis not present

## 2016-12-02 DIAGNOSIS — I2510 Atherosclerotic heart disease of native coronary artery without angina pectoris: Secondary | ICD-10-CM | POA: Diagnosis not present

## 2017-01-27 ENCOUNTER — Telehealth: Payer: Self-pay | Admitting: Cardiology

## 2017-01-27 ENCOUNTER — Other Ambulatory Visit: Payer: Self-pay

## 2017-01-27 MED ORDER — SPIRONOLACTONE 25 MG PO TABS: 25.0000 mg | ORAL_TABLET | Freq: Every day | ORAL | 2 refills | Status: DC

## 2017-01-27 NOTE — Telephone Encounter (Signed)
Refill sent.

## 2017-01-27 NOTE — Telephone Encounter (Signed)
°*  STAT* If patient is at the pharmacy, call can be transferred to refill team.   1. Which medications need to be refilled? (please list name of each medication and dose if known) SPiralactone  2. Which pharmacy/location (including street and city if local pharmacy) is medication to be sent to?[facility] in [address]  3. Do they need a 30 day or 90 day supply? [place]

## 2017-01-28 DIAGNOSIS — Z1389 Encounter for screening for other disorder: Secondary | ICD-10-CM | POA: Diagnosis not present

## 2017-01-28 DIAGNOSIS — Z0000 Encounter for general adult medical examination without abnormal findings: Secondary | ICD-10-CM | POA: Diagnosis not present

## 2017-01-28 DIAGNOSIS — Z9181 History of falling: Secondary | ICD-10-CM | POA: Diagnosis not present

## 2017-01-28 DIAGNOSIS — Z6823 Body mass index (BMI) 23.0-23.9, adult: Secondary | ICD-10-CM | POA: Diagnosis not present

## 2017-01-28 DIAGNOSIS — S46211S Strain of muscle, fascia and tendon of other parts of biceps, right arm, sequela: Secondary | ICD-10-CM | POA: Diagnosis not present

## 2017-02-02 DIAGNOSIS — S46211A Strain of muscle, fascia and tendon of other parts of biceps, right arm, initial encounter: Secondary | ICD-10-CM | POA: Diagnosis not present

## 2017-02-11 DIAGNOSIS — G8929 Other chronic pain: Secondary | ICD-10-CM | POA: Diagnosis not present

## 2017-02-11 DIAGNOSIS — M25562 Pain in left knee: Secondary | ICD-10-CM | POA: Diagnosis not present

## 2017-02-11 DIAGNOSIS — M25561 Pain in right knee: Secondary | ICD-10-CM | POA: Diagnosis not present

## 2017-03-04 DIAGNOSIS — M170 Bilateral primary osteoarthritis of knee: Secondary | ICD-10-CM | POA: Diagnosis not present

## 2017-03-11 DIAGNOSIS — M170 Bilateral primary osteoarthritis of knee: Secondary | ICD-10-CM | POA: Diagnosis not present

## 2017-03-18 DIAGNOSIS — M170 Bilateral primary osteoarthritis of knee: Secondary | ICD-10-CM | POA: Diagnosis not present

## 2017-04-14 DIAGNOSIS — M109 Gout, unspecified: Secondary | ICD-10-CM

## 2017-04-14 DIAGNOSIS — D72829 Elevated white blood cell count, unspecified: Secondary | ICD-10-CM

## 2017-04-14 DIAGNOSIS — C439 Malignant melanoma of skin, unspecified: Secondary | ICD-10-CM

## 2017-04-14 DIAGNOSIS — I493 Ventricular premature depolarization: Secondary | ICD-10-CM

## 2017-04-14 DIAGNOSIS — I2510 Atherosclerotic heart disease of native coronary artery without angina pectoris: Secondary | ICD-10-CM

## 2017-04-14 DIAGNOSIS — Z951 Presence of aortocoronary bypass graft: Secondary | ICD-10-CM

## 2017-04-14 DIAGNOSIS — M1990 Unspecified osteoarthritis, unspecified site: Secondary | ICD-10-CM | POA: Insufficient documentation

## 2017-04-14 DIAGNOSIS — N189 Chronic kidney disease, unspecified: Secondary | ICD-10-CM | POA: Insufficient documentation

## 2017-04-14 DIAGNOSIS — E669 Obesity, unspecified: Secondary | ICD-10-CM | POA: Insufficient documentation

## 2017-04-14 DIAGNOSIS — I444 Left anterior fascicular block: Secondary | ICD-10-CM

## 2017-04-14 HISTORY — DX: Atherosclerotic heart disease of native coronary artery without angina pectoris: I25.10

## 2017-04-14 HISTORY — DX: Obesity, unspecified: E66.9

## 2017-04-14 HISTORY — DX: Elevated white blood cell count, unspecified: D72.829

## 2017-04-14 HISTORY — DX: Malignant melanoma of skin, unspecified: C43.9

## 2017-04-14 HISTORY — DX: Presence of aortocoronary bypass graft: Z95.1

## 2017-04-14 HISTORY — DX: Gout, unspecified: M10.9

## 2017-04-14 HISTORY — DX: Ventricular premature depolarization: I49.3

## 2017-04-14 HISTORY — DX: Unspecified osteoarthritis, unspecified site: M19.90

## 2017-04-14 HISTORY — DX: Chronic kidney disease, unspecified: N18.9

## 2017-04-14 HISTORY — DX: Left anterior fascicular block: I44.4

## 2017-05-17 ENCOUNTER — Other Ambulatory Visit: Payer: Self-pay | Admitting: *Deleted

## 2017-05-23 ENCOUNTER — Encounter: Payer: Self-pay | Admitting: Cardiology

## 2017-05-23 ENCOUNTER — Ambulatory Visit (INDEPENDENT_AMBULATORY_CARE_PROVIDER_SITE_OTHER): Payer: Medicare Other | Admitting: Cardiology

## 2017-05-23 VITALS — BP 120/74 | HR 64 | Ht 69.0 in | Wt 156.0 lb

## 2017-05-23 DIAGNOSIS — I25118 Atherosclerotic heart disease of native coronary artery with other forms of angina pectoris: Secondary | ICD-10-CM | POA: Diagnosis not present

## 2017-05-23 DIAGNOSIS — Z23 Encounter for immunization: Secondary | ICD-10-CM | POA: Diagnosis not present

## 2017-05-23 DIAGNOSIS — E785 Hyperlipidemia, unspecified: Secondary | ICD-10-CM

## 2017-05-23 DIAGNOSIS — I10 Essential (primary) hypertension: Secondary | ICD-10-CM | POA: Diagnosis not present

## 2017-05-23 MED ORDER — PRAVASTATIN SODIUM 40 MG PO TABS: 40.0000 mg | ORAL_TABLET | Freq: Every day | ORAL | 3 refills | Status: DC

## 2017-05-23 NOTE — Patient Instructions (Signed)
Medication Instructions:  Your physician recommends that you continue on your current medications as directed. Please refer to the Current Medication list given to you today.  Labwork: Your physician recommends that you return for lab work in: today. CMP, lipid  Testing/Procedures: None  Follow-Up: Your physician wants you to follow-up in: 6 months. You will receive a reminder letter in the mail two months in advance. If you don't receive a letter, please call our office to schedule the follow-up appointment.  Any Other Special Instructions Will Be Listed Below (If Applicable).     If you need a refill on your cardiac medications before your next appointment, please call your pharmacy.

## 2017-05-23 NOTE — Progress Notes (Signed)
Cardiology Office Note:    Date:  05/23/2017   ID:  Ronald Dunn, DOB September 02, 1931, MRN [id number]  PCP:  Angelina Sheriff, MD  Cardiologist:  Shirlee More, MD    Referring MD: Angelina Sheriff, MD    ASSESSMENT:    1. Coronary artery disease of native artery of native heart with stable angina pectoris ([facility])   2. Essential hypertension   3. Hyperlipidemia, unspecified hyperlipidemia type    PLAN:    In order of problems listed above:  1. Stable asymptomatic continue treatment with aspirin and statin 2. Stable continue treatment with diuretic check renal function for safety 3. Stable continue statin check CMP profile for safety and efficacy   Next appointment: 6 months   Medication Adjustments/Labs and Tests Ordered: Current medicines are reviewed at length with the patient today.  Concerns regarding medicines are outlined above.  No orders of the defined types were placed in this encounter.  No orders of the defined types were placed in this encounter.   Chief Complaint  Patient presents with  . Coronary Artery Disease    History of Present Illness:    Ronald Dunn is a 83 y.o. male with a hx of CAD, Dyslipidemia, HTN, S/P CABG 08/26/15 and malaise with a beta blocker last seen 6 months ago. Compliance with diet, lifestyle and medications: Yes He is increasingly limited by knee pain [facility] and has not had angina shortness of breath palpitation syncope or TIA.  His malaise is improved off beta-blocker Past Medical History:  Diagnosis Date  . Abnormal stress echocardiogram 08/16/2015   Overview:  With post exercise LV dilation  . Arthritis 04/14/2017  . CKD (chronic kidney disease) 04/14/2017  . Coronary artery disease involving native coronary artery of native heart 04/14/2017  . Coronary artery disease with stable angina pectoris ([place]) 09/01/2015  . Essential hypertension 07/22/2015  . Gout 04/14/2017  . Hyperlipidemia 07/22/2015  . 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 Leukocytosis 04/14/2017  . Obesity 04/14/2017  . On amiodarone therapy 09/01/2015  . PAF (paroxysmal atrial fibrillation) ([place]) 09/01/2015  . Pleural effusion 09/22/2015   Overview:  Left, post CABG with US guided thoracentesis  . Premature ventricular contractions (PVCs) (VPCs) 04/14/2017  . S/P CABG (coronary artery bypass graft) 04/14/2017  . Skin cancer (melanoma) ([place]) 04/14/2017    Past Surgical History:  Procedure Laterality Date  . CORNEAL TRANSPLANT    . CORONARY ARTERY BYPASS GRAFT  08/26/2015  . FOOT SURGERY      Current Medications: Current Meds  Medication Sig  . allopurinol (ZYLOPRIM) 100 MG tablet Take 200 mg by mouth.  Marland Kitchen aspirin EC 81 MG tablet Take 81 mg by mouth.  Marland Kitchen glucosamine-chondroitin 500-400 MG tablet Take by mouth.  . loteprednol (LOTEMAX) 0.5 % ophthalmic suspension Administer 1 drop to both eyes daily.  . Multiple Vitamin (MULTIVITAMIN) capsule Take by mouth.  . pravastatin (PRAVACHOL) 40 MG tablet Take 40 mg by mouth.  . spironolactone (ALDACTONE) 25 MG tablet Take 1 tablet (25 mg total) by mouth daily.     Allergies:   Patient has no known allergies.   Social History   Social History  . Marital status: Married    Spouse name: N/A  . Number of children: N/A  . Years of education: N/A   Social History Main Topics  . Smoking status: Never Smoker  . Smokeless tobacco: Never Used  . Alcohol use Yes     Comment: drinks  in moderation  . Drug use: No  . Sexual activity: Not Asked   Other Topics Concern  . None   Social History Narrative  . None     Family History: The patient's family history includes Heart disease in his father; Heart failure in his father. ROS:   Please see the history of present illness.    All other systems reviewed and are negative.  EKGs/Labs/Other Studies Reviewed:    The following studies were reviewed today:  Recent Labs: No results found for requested labs within last 8760 hours.    Recent Lipid Panel No results found for: CHOL, TRIG, HDL, CHOLHDL, VLDL, LDLCALC, LDLDIRECT  Physical Exam:    VS:  BP 120/74 (BP Location: Right Arm, Patient Position: Sitting, Cuff Size: Normal)   Pulse 64   Ht 5\' 9"  (1.753 m)   Wt 156 lb (70.8 kg)   SpO2 94%   BMI 23.04 kg/m     Wt Readings from Last 3 Encounters:  05/23/17 156 lb (70.8 kg)     GEN:  Well nourished, well developed in no acute distress HEENT: Normal NECK: No JVD; No carotid bruits LYMPHATICS: No lymphadenopathy CARDIAC: RRR, no murmurs, rubs, gallops RESPIRATORY:  Clear to auscultation without rales, wheezing or rhonchi  ABDOMEN: Soft, non-tender, non-distended MUSCULOSKELETAL:  No edema; No deformity  SKIN: Warm and dry NEUROLOGIC:  Alert and oriented x 3 PSYCHIATRIC:  Normal affect    Signed, Shirlee More, MD  05/23/2017 9:35 AM    [place]

## 2017-05-24 LAB — COMPREHENSIVE METABOLIC PANEL
ALT: 20 IU/L (ref 0–44)
AST: 26 IU/L (ref 0–40)
Albumin/Globulin Ratio: 1.6 (ref 1.2–2.2)
Albumin: 4.3 g/dL (ref 3.5–4.7)
Alkaline Phosphatase: 82 IU/L (ref 39–117)
BUN / CREAT RATIO: 22 (ref 10–24)
BUN: 24 mg/dL (ref 8–27)
Bilirubin Total: 0.4 mg/dL (ref 0.0–1.2)
CALCIUM: 9.7 mg/dL (ref 8.6–10.2)
CO2: 26 mmol/L (ref 20–29)
CREATININE: 1.09 mg/dL (ref 0.76–1.27)
Chloride: 99 mmol/L (ref 96–106)
GFR, EST AFRICAN AMERICAN: 73 mL/min/{1.73_m2} (ref >59)
GFR, EST NON AFRICAN AMERICAN: 63 mL/min/{1.73_m2} (ref >59)
GLUCOSE: 86 mg/dL (ref 65–99)
Globulin, Total: 2.7 g/dL (ref 1.5–4.5)
Potassium: 4.7 mmol/L (ref 3.5–5.2)
Sodium: 139 mmol/L (ref 134–144)
TOTAL PROTEIN: 7 g/dL (ref 6.0–8.5)

## 2017-05-24 LAB — LIPID PANEL
CHOL/HDL RATIO: 2.5 ratio (ref 0.0–5.0)
CHOLESTEROL TOTAL: 168 mg/dL (ref 100–199)
HDL: 68 mg/dL (ref >39)
LDL CALC: 86 mg/dL (ref 0–99)
Triglycerides: 72 mg/dL (ref 0–149)
VLDL CHOLESTEROL CAL: 14 mg/dL (ref 5–40)

## 2017-05-30 ENCOUNTER — Telehealth: Payer: Self-pay

## 2017-05-30 NOTE — Telephone Encounter (Signed)
Patient informed of lab results. Mailing results to home address per request.

## 2017-07-13 DIAGNOSIS — M25562 Pain in left knee: Secondary | ICD-10-CM | POA: Diagnosis not present

## 2017-07-13 DIAGNOSIS — M150 Primary generalized (osteo)arthritis: Secondary | ICD-10-CM | POA: Diagnosis not present

## 2017-07-13 DIAGNOSIS — M25561 Pain in right knee: Secondary | ICD-10-CM | POA: Diagnosis not present

## 2017-07-13 DIAGNOSIS — N183 Chronic kidney disease, stage 3 (moderate): Secondary | ICD-10-CM | POA: Diagnosis not present

## 2017-07-13 DIAGNOSIS — M1A09X0 Idiopathic chronic gout, multiple sites, without tophus (tophi): Secondary | ICD-10-CM | POA: Diagnosis not present

## 2017-07-13 DIAGNOSIS — Z6823 Body mass index (BMI) 23.0-23.9, adult: Secondary | ICD-10-CM | POA: Diagnosis not present

## 2017-09-13 DIAGNOSIS — Z79899 Other long term (current) drug therapy: Secondary | ICD-10-CM | POA: Diagnosis not present

## 2017-09-13 DIAGNOSIS — Z6823 Body mass index (BMI) 23.0-23.9, adult: Secondary | ICD-10-CM | POA: Diagnosis not present

## 2017-09-13 DIAGNOSIS — M1990 Unspecified osteoarthritis, unspecified site: Secondary | ICD-10-CM | POA: Diagnosis not present

## 2017-09-13 DIAGNOSIS — E785 Hyperlipidemia, unspecified: Secondary | ICD-10-CM | POA: Diagnosis not present

## 2017-09-13 DIAGNOSIS — M109 Gout, unspecified: Secondary | ICD-10-CM | POA: Diagnosis not present

## 2017-09-13 DIAGNOSIS — I259 Chronic ischemic heart disease, unspecified: Secondary | ICD-10-CM | POA: Diagnosis not present

## 2017-09-13 DIAGNOSIS — D519 Vitamin B12 deficiency anemia, unspecified: Secondary | ICD-10-CM | POA: Diagnosis not present

## 2017-09-22 DIAGNOSIS — M810 Age-related osteoporosis without current pathological fracture: Secondary | ICD-10-CM | POA: Diagnosis not present

## 2017-09-22 DIAGNOSIS — D649 Anemia, unspecified: Secondary | ICD-10-CM | POA: Diagnosis not present

## 2017-10-17 DIAGNOSIS — C44622 Squamous cell carcinoma of skin of right upper limb, including shoulder: Secondary | ICD-10-CM | POA: Diagnosis not present

## 2017-10-17 DIAGNOSIS — L570 Actinic keratosis: Secondary | ICD-10-CM | POA: Diagnosis not present

## 2018-01-12 DIAGNOSIS — N183 Chronic kidney disease, stage 3 (moderate): Secondary | ICD-10-CM | POA: Diagnosis not present

## 2018-01-12 DIAGNOSIS — M791 Myalgia, unspecified site: Secondary | ICD-10-CM | POA: Diagnosis not present

## 2018-01-12 DIAGNOSIS — Z6821 Body mass index (BMI) 21.0-21.9, adult: Secondary | ICD-10-CM | POA: Diagnosis not present

## 2018-01-12 DIAGNOSIS — M150 Primary generalized (osteo)arthritis: Secondary | ICD-10-CM | POA: Diagnosis not present

## 2018-01-12 DIAGNOSIS — M1A09X0 Idiopathic chronic gout, multiple sites, without tophus (tophi): Secondary | ICD-10-CM | POA: Diagnosis not present

## 2018-01-18 DIAGNOSIS — H43813 Vitreous degeneration, bilateral: Secondary | ICD-10-CM | POA: Diagnosis not present

## 2018-01-19 DIAGNOSIS — L570 Actinic keratosis: Secondary | ICD-10-CM | POA: Diagnosis not present

## 2018-01-19 DIAGNOSIS — D485 Neoplasm of uncertain behavior of skin: Secondary | ICD-10-CM | POA: Diagnosis not present

## 2018-01-19 DIAGNOSIS — R233 Spontaneous ecchymoses: Secondary | ICD-10-CM | POA: Diagnosis not present

## 2018-01-20 DIAGNOSIS — Z6823 Body mass index (BMI) 23.0-23.9, adult: Secondary | ICD-10-CM | POA: Diagnosis not present

## 2018-01-20 DIAGNOSIS — R351 Nocturia: Secondary | ICD-10-CM | POA: Diagnosis not present

## 2018-01-30 NOTE — Progress Notes (Signed)
Cardiology Office Note:    Date:  01/31/2018   ID:  Ronald Dunn, DOB 1931-11-19, MRN [id number]  PCP:  Angelina Sheriff, MD  Cardiologist:  Shirlee More, MD    Referring MD: Angelina Sheriff, MD    ASSESSMENT:    1. Coronary artery disease of native artery of native heart with stable angina pectoris ([place])   2. S/P CABG (coronary artery bypass graft)   3. Essential hypertension   4. Left anterior hemiblock   5. Premature ventricular contractions (PVCs) (VPCs)   6. Hyperlipidemia, unspecified hyperlipidemia type   7. Weakness    PLAN:    In order of problems listed above:  1. Stable he will continue current medical treatment with aspirin at this time I do not think he requires an ischemia evaluation 2. Stable see above 3. Stable blood pressure target continue distal diuretic 4. Stable pattern on today's EKG 5. Stable asymptomatic no EKG findings of PVC today He is having weakness that he attributes to his statin will discontinue for the next 4 to 6 weeks contact me regarding response and if improved will use non-statin therapy such as Zetia or the new injectable PC SK 9 agents  Next appointment: 6 months   Medication Adjustments/Labs and Tests Ordered: Current medicines are reviewed at length with the patient today.  Concerns regarding medicines are outlined above.  Orders Placed This Encounter  Procedures  . EKG 12-Lead   Meds ordered this encounter  Medications  . spironolactone (ALDACTONE) 25 MG tablet    Sig: Take 1 tablet (25 mg total) by mouth daily.    Dispense:  90 tablet    Refill:  4    Chief Complaint  Patient presents with  . Follow-up  . Coronary Artery Disease  . Hypertension  . Hyperlipidemia    History of Present Illness:    Ronald Dunn is a 84 y.o. male with a hx of CAD, Dyslipidemia, HTN, S/P CABG   last seen 05/23/17. Compliance with diet, lifestyle and medications: Yes Remains vigorous active is had no angina dyspnea palpitation  or syncope but feels he has weakness related to his statin and wants to discontinue short-term to look at his response.  Recent labs requested from his PCP office Past Medical History:  Diagnosis Date  . Abnormal stress echocardiogram 08/16/2015   Overview:  With post exercise LV dilation  . Arthritis 04/14/2017  . CKD (chronic kidney disease) 04/14/2017  . Coronary artery disease involving native coronary artery of native heart 04/14/2017  . Coronary artery disease with stable angina pectoris ([place]) 09/01/2015  . Essential hypertension 07/22/2015  . Gout 04/14/2017  . Hyperlipidemia 07/22/2015  . 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 Leukocytosis 04/14/2017  . Obesity 04/14/2017  . On amiodarone therapy 09/01/2015  . PAF (paroxysmal atrial fibrillation) ([place]) 09/01/2015  . Pleural effusion 09/22/2015   Overview:  Left, post CABG with US guided thoracentesis  . Premature ventricular contractions (PVCs) (VPCs) 04/14/2017  . S/P CABG (coronary artery bypass graft) 04/14/2017  . Skin cancer (melanoma) ([place]) 04/14/2017    Past Surgical History:  Procedure Laterality Date  . CARDIAC CATHETERIZATION    . CORNEAL TRANSPLANT    . CORONARY ARTERY BYPASS GRAFT  08/26/2015  . FOOT SURGERY      Current Medications: Current Meds  Medication Sig  . allopurinol (ZYLOPRIM) 100 MG tablet Take 200 mg by mouth daily.   Marland Kitchen aspirin EC 81 MG tablet Take 81 mg by mouth daily.   Marland Kitchen  glucosamine-chondroitin 500-400 MG tablet Take 1 tablet by mouth 2 (two) times daily.   Marland Kitchen loteprednol (LOTEMAX) 0.5 % ophthalmic suspension Administer 1 drop to both eyes daily.  . Multiple Vitamin (MULTIVITAMIN) capsule Take 1 capsule by mouth daily.   Marland Kitchen spironolactone (ALDACTONE) 25 MG tablet Take 1 tablet (25 mg total) by mouth daily.  . [DISCONTINUED] pravastatin (PRAVACHOL) 40 MG tablet Take 1 tablet (40 mg total) by mouth daily.  . [DISCONTINUED] spironolactone (ALDACTONE) 25 MG tablet Take 1 tablet (25 mg total) by mouth daily.      Allergies:   Patient has no known allergies.   Social History   Socioeconomic History  . Marital status: Married    Spouse name: Not on file  . Number of children: Not on file  . Years of education: Not on file  . Highest education level: Not on file  Occupational History  . Not on file  Social Needs  . Financial resource strain: Not on file  . Food insecurity:    Worry: Not on file    Inability: Not on file  . Transportation needs:    Medical: Not on file    Non-medical: Not on file  Tobacco Use  . Smoking status: Never Smoker  . Smokeless tobacco: Never Used  Substance and Sexual Activity  . Alcohol use: Yes    Alcohol/week: 0.6 oz    Types: 1 Glasses of wine per week  . Drug use: No  . Sexual activity: Not on file  Lifestyle  . Physical activity:    Days per week: Not on file    Minutes per session: Not on file  . Stress: Not on file  Relationships  . Social connections:    Talks on phone: Not on file    Gets together: Not on file    Attends religious service: Not on file    Active member of club or organization: Not on file    Attends meetings of clubs or organizations: Not on file    Relationship status: Not on file  Other Topics Concern  . Not on file  Social History Narrative  . Not on file     Family History: The patient's family history includes Heart disease in his father; Heart failure in his father. ROS:   Please see the history of present illness.    All other systems reviewed and are negative.  EKGs/Labs/Other Studies Reviewed:    The following studies were reviewed today:  EKG:  EKG ordered today.  The ekg ordered today demonstrates sinus rhythm left anterior hemiblock  Recent Labs: 05/23/2017: ALT 20; BUN 24; Creatinine, Ser 1.09; Potassium 4.7; Sodium 139  Recent Lipid Panel    Component Value Date/Time   CHOL 168 05/23/2017 0957   TRIG 72 05/23/2017 0957   HDL 68 05/23/2017 0957   CHOLHDL 2.5 05/23/2017 0957   LDLCALC 86  05/23/2017 0957    Physical Exam:    VS:  BP 134/82 (BP Location: Right Arm, Patient Position: Sitting, Cuff Size: Normal)   Pulse 63   Ht 5\' 9"  (1.753 m)   Wt 156 lb 6.4 oz (70.9 kg)   SpO2 95%   BMI 23.10 kg/m     Wt Readings from Last 3 Encounters:  01/31/18 156 lb 6.4 oz (70.9 kg)  05/23/17 156 lb (70.8 kg)     GEN:  Well nourished, well developed in no acute distress HEENT: Normal NECK: No JVD; No carotid bruits LYMPHATICS: No lymphadenopathy CARDIAC:  RRR,  no murmurs, rubs, gallops RESPIRATORY:  Clear to auscultation without rales, wheezing or rhonchi  ABDOMEN: Soft, non-tender, non-distended MUSCULOSKELETAL:  No edema; No deformity  SKIN: Warm and dry NEUROLOGIC:  Alert and oriented x 3 PSYCHIATRIC:  Normal affect    Signed, Shirlee More, MD  01/31/2018 4:49 PM    [facility]

## 2018-01-31 ENCOUNTER — Encounter: Payer: Self-pay | Admitting: Cardiology

## 2018-01-31 ENCOUNTER — Ambulatory Visit (INDEPENDENT_AMBULATORY_CARE_PROVIDER_SITE_OTHER): Payer: Medicare Other | Admitting: Cardiology

## 2018-01-31 VITALS — BP 134/82 | HR 63 | Ht 69.0 in | Wt 156.4 lb

## 2018-01-31 DIAGNOSIS — I25118 Atherosclerotic heart disease of native coronary artery with other forms of angina pectoris: Secondary | ICD-10-CM

## 2018-01-31 DIAGNOSIS — I493 Ventricular premature depolarization: Secondary | ICD-10-CM

## 2018-01-31 DIAGNOSIS — I10 Essential (primary) hypertension: Secondary | ICD-10-CM

## 2018-01-31 DIAGNOSIS — Z951 Presence of aortocoronary bypass graft: Secondary | ICD-10-CM

## 2018-01-31 DIAGNOSIS — E785 Hyperlipidemia, unspecified: Secondary | ICD-10-CM | POA: Diagnosis not present

## 2018-01-31 DIAGNOSIS — R531 Weakness: Secondary | ICD-10-CM | POA: Diagnosis not present

## 2018-01-31 DIAGNOSIS — I444 Left anterior fascicular block: Secondary | ICD-10-CM | POA: Diagnosis not present

## 2018-01-31 MED ORDER — SPIRONOLACTONE 25 MG PO TABS: 25.0000 mg | ORAL_TABLET | Freq: Every day | ORAL | 4 refills | Status: DC

## 2018-01-31 NOTE — Patient Instructions (Addendum)
Medication Instructions:  Your physician recommends that you continue on your current medications as directed. Please refer to the Current Medication list given to you today.   Labwork: NONE  Testing/Procedures: You had an EKG today  Follow-Up: Your physician wants you to follow-up in: 6 months. You will receive a reminder letter in the mail two months in advance. If you don't receive a letter, please call our office to schedule the follow-up appointment.   Any Other Special Instructions Will Be Listed Below (If Applicable).     If you need a refill on your cardiac medications before your next appointment, please call your pharmacy.     Stop your pravastatin and call in 4-6 weeks re response

## 2018-04-13 ENCOUNTER — Telehealth: Payer: Self-pay | Admitting: Cardiology

## 2018-04-13 DIAGNOSIS — E785 Hyperlipidemia, unspecified: Secondary | ICD-10-CM

## 2018-04-13 NOTE — Telephone Encounter (Signed)
Patient states that after his last appointment with Dr. Bettina Gavia on 01/31/18 he stopped his pravastatin for 6 weeks as discussed and his muscle weakness improved. Patient decided to restart this medication to see if symptoms would resurface and they did. Will have Dr. Bettina Gavia advise of alternate medication options and update patient accordingly.

## 2018-04-13 NOTE — Telephone Encounter (Signed)
Please call patient after experimenting with meds per Dr. Bettina Gavia, he just wants to talk to you about it.

## 2018-04-14 MED ORDER — EZETIMIBE 10 MG PO TABS: 10.0000 mg | ORAL_TABLET | Freq: Every day | ORAL | 3 refills | Status: DC

## 2018-04-14 NOTE — Telephone Encounter (Signed)
Patient advised to stop taking pravastatin at this time and start zetia 10 mg daily. Prescription sent to [facility] in [place] as requested. Informed patient to return for follow up lab work to check his cholesterol in 1 month, no appointment needed. Reminded him to fast beforehand. Patient verbalized understanding. No further questions.

## 2018-04-14 NOTE — Addendum Note (Signed)
Addended by: Austin Miles on: 04/14/2018 09:16 AM   Modules accepted: Orders

## 2018-04-14 NOTE — Telephone Encounter (Signed)
Start zetia 10 mg a day non statin # 30 3 refill with lipids 1 month and stay off statin

## 2018-05-16 DIAGNOSIS — G8929 Other chronic pain: Secondary | ICD-10-CM | POA: Insufficient documentation

## 2018-05-16 DIAGNOSIS — M170 Bilateral primary osteoarthritis of knee: Secondary | ICD-10-CM | POA: Diagnosis not present

## 2018-05-16 DIAGNOSIS — M25562 Pain in left knee: Secondary | ICD-10-CM | POA: Diagnosis not present

## 2018-05-16 DIAGNOSIS — M25561 Pain in right knee: Secondary | ICD-10-CM | POA: Diagnosis not present

## 2018-05-18 DIAGNOSIS — Z23 Encounter for immunization: Secondary | ICD-10-CM | POA: Diagnosis not present

## 2018-05-18 DIAGNOSIS — Z01818 Encounter for other preprocedural examination: Secondary | ICD-10-CM | POA: Diagnosis not present

## 2018-05-18 DIAGNOSIS — Z79899 Other long term (current) drug therapy: Secondary | ICD-10-CM | POA: Diagnosis not present

## 2018-05-22 DIAGNOSIS — E785 Hyperlipidemia, unspecified: Secondary | ICD-10-CM | POA: Diagnosis not present

## 2018-05-22 LAB — LIPID PANEL
Chol/HDL Ratio: 2.8 ratio (ref 0.0–5.0)
Cholesterol, Total: 176 mg/dL (ref 100–199)
HDL: 64 mg/dL (ref >39)
LDL CALC: 93 mg/dL (ref 0–99)
Triglycerides: 93 mg/dL (ref 0–149)
VLDL CHOLESTEROL CAL: 19 mg/dL (ref 5–40)

## 2018-05-26 DIAGNOSIS — D649 Anemia, unspecified: Secondary | ICD-10-CM

## 2018-05-26 DIAGNOSIS — D638 Anemia in other chronic diseases classified elsewhere: Secondary | ICD-10-CM | POA: Insufficient documentation

## 2018-05-26 HISTORY — DX: Anemia, unspecified: D64.9

## 2018-06-07 DIAGNOSIS — L84 Corns and callosities: Secondary | ICD-10-CM | POA: Diagnosis not present

## 2018-06-07 DIAGNOSIS — M7741 Metatarsalgia, right foot: Secondary | ICD-10-CM | POA: Diagnosis not present

## 2018-06-07 DIAGNOSIS — R29898 Other symptoms and signs involving the musculoskeletal system: Secondary | ICD-10-CM | POA: Diagnosis not present

## 2018-06-13 ENCOUNTER — Other Ambulatory Visit: Payer: Self-pay

## 2018-06-26 ENCOUNTER — Other Ambulatory Visit: Payer: Self-pay | Admitting: Orthopedic Surgery

## 2018-06-28 DIAGNOSIS — D649 Anemia, unspecified: Secondary | ICD-10-CM | POA: Diagnosis not present

## 2018-06-28 DIAGNOSIS — D519 Vitamin B12 deficiency anemia, unspecified: Secondary | ICD-10-CM | POA: Diagnosis not present

## 2018-07-03 DIAGNOSIS — D649 Anemia, unspecified: Secondary | ICD-10-CM | POA: Diagnosis not present

## 2018-07-12 DIAGNOSIS — D510 Vitamin B12 deficiency anemia due to intrinsic factor deficiency: Secondary | ICD-10-CM | POA: Diagnosis not present

## 2018-07-13 ENCOUNTER — Telehealth: Payer: Self-pay

## 2018-07-13 MED ORDER — EZETIMIBE 10 MG PO TABS: 10.0000 mg | ORAL_TABLET | Freq: Every day | ORAL | 1 refills | Status: DC

## 2018-07-13 NOTE — Telephone Encounter (Signed)
Rx sent to pharmacy as requested.

## 2018-07-17 DIAGNOSIS — D510 Vitamin B12 deficiency anemia due to intrinsic factor deficiency: Secondary | ICD-10-CM | POA: Diagnosis not present

## 2018-07-24 DIAGNOSIS — D510 Vitamin B12 deficiency anemia due to intrinsic factor deficiency: Secondary | ICD-10-CM | POA: Diagnosis not present

## 2018-07-25 DIAGNOSIS — L814 Other melanin hyperpigmentation: Secondary | ICD-10-CM | POA: Diagnosis not present

## 2018-07-25 DIAGNOSIS — L821 Other seborrheic keratosis: Secondary | ICD-10-CM | POA: Diagnosis not present

## 2018-07-25 DIAGNOSIS — L820 Inflamed seborrheic keratosis: Secondary | ICD-10-CM | POA: Diagnosis not present

## 2018-07-25 DIAGNOSIS — Z85820 Personal history of malignant melanoma of skin: Secondary | ICD-10-CM | POA: Diagnosis not present

## 2018-07-25 DIAGNOSIS — D1801 Hemangioma of skin and subcutaneous tissue: Secondary | ICD-10-CM | POA: Diagnosis not present

## 2018-08-07 ENCOUNTER — Encounter (HOSPITAL_COMMUNITY): Payer: Self-pay

## 2018-08-07 DIAGNOSIS — S61411A Laceration without foreign body of right hand, initial encounter: Secondary | ICD-10-CM | POA: Diagnosis not present

## 2018-08-07 NOTE — Patient Instructions (Addendum)
Your procedure is scheduled on: Monday, Jan. 20, 2020   Surgery Time:  11:20AM-12:35PM   Report to [facility]  Entrance    Report to admitting at 8:45 AM   Call this number if you have problems the morning of surgery [phone]   Do not eat food or drink liquids :After Midnight.   Brush your teeth the morning of surgery.   Do NOT smoke after Midnight   Take these medicines the morning of surgery with A SIP OF WATER: Allopurinol   Use eye drops per normal routine                               You may not have any metal on your body including jewelry, and body piercings             Do not wear lotions, powders, perfumes/cologne, or deodorant                          Men may shave face and neck.   Do not bring valuables to the hospital. [place].   Contacts, dentures or bridgework may not be worn into surgery.   Leave suitcase in the car. After surgery it may be brought to your room.    Special Instructions: Bring a copy of your healthcare power of attorney and living will documents         the day of surgery if you haven't scanned them in before.              Please read over the following fact sheets you were given:  [facility] - Preparing for Surgery Before surgery, you can play an important role.  Because skin is not sterile, your skin needs to be as free of germs as possible.  You can reduce the number of germs on your skin by washing with CHG (chlorahexidine gluconate) soap before surgery.  CHG is an antiseptic cleaner which kills germs and bonds with the skin to continue killing germs even after washing. Please DO NOT use if you have an allergy to CHG or antibacterial soaps.  If your skin becomes reddened/irritated stop using the CHG and inform your nurse when you arrive at Short Stay. Do not shave (including legs and underarms) for at least 48 hours prior to the first CHG shower.  You may shave your  face/neck.  Please follow these instructions carefully:  1.  Shower with CHG Soap the night before surgery and the  morning of surgery.  2.  If you choose to wash your hair, wash your hair first as usual with your normal  shampoo.  3.  After you shampoo, rinse your hair and body thoroughly to remove the shampoo.                             4.  Use CHG as you would any other liquid soap.  You can apply chg directly to the skin and wash.  Gently with a scrungie or clean washcloth.  5.  Apply the CHG Soap to your body ONLY FROM THE NECK DOWN.   Do   not use on face/ open  Wound or open sores. Avoid contact with eyes, ears mouth and   genitals (private parts).                       Wash face,  Genitals (private parts) with your normal soap.             6.  Wash thoroughly, paying special attention to the area where your    surgery  will be performed.  7.  Thoroughly rinse your body with warm water from the neck down.  8.  DO NOT shower/wash with your normal soap after using and rinsing off the CHG Soap.                9.  Pat yourself dry with a clean towel.            10.  Wear clean pajamas.            11.  Place clean sheets on your bed the night of your first shower and do not  sleep with pets. Day of Surgery : Do not apply any lotions/deodorants the morning of surgery.  Please wear clean clothes to the hospital/surgery center.  FAILURE TO FOLLOW THESE INSTRUCTIONS MAY RESULT IN THE CANCELLATION OF YOUR SURGERY  PATIENT SIGNATURE_________________________________  NURSE SIGNATURE__________________________________  ________________________________________________________________________   Ronald Dunn  An incentive spirometer is a tool that can help keep your lungs clear and active. This tool measures how well you are filling your lungs with each breath. Taking long deep breaths may help reverse or decrease the chance of developing breathing (pulmonary)  problems (especially infection) following:  A long period of time when you are unable to move or be active. BEFORE THE PROCEDURE   If the spirometer includes an indicator to show your best effort, your nurse or respiratory therapist will set it to a desired goal.  If possible, sit up straight or lean slightly forward. Try not to slouch.  Hold the incentive spirometer in an upright position. INSTRUCTIONS FOR USE  1. Sit on the edge of your bed if possible, or sit up as far as you can in bed or on a chair. 2. Hold the incentive spirometer in an upright position. 3. Breathe out normally. 4. Place the mouthpiece in your mouth and seal your lips tightly around it. 5. Breathe in slowly and as deeply as possible, raising the piston or the ball toward the top of the column. 6. Hold your breath for 3-5 seconds or for as long as possible. Allow the piston or ball to fall to the bottom of the column. 7. Remove the mouthpiece from your mouth and breathe out normally. 8. Rest for a few seconds and repeat Steps 1 through 7 at least 10 times every 1-2 hours when you are awake. Take your time and take a few normal breaths between deep breaths. 9. The spirometer may include an indicator to show your best effort. Use the indicator as a goal to work toward during each repetition. 10. After each set of 10 deep breaths, practice coughing to be sure your lungs are clear. If you have an incision (the cut made at the time of surgery), support your incision when coughing by placing a pillow or rolled up towels firmly against it. Once you are able to get out of bed, walk around indoors and cough well. You may stop using the incentive spirometer when instructed by your caregiver.  RISKS AND COMPLICATIONS  Take your time  so you do not get dizzy or light-headed.  If you are in pain, you may need to take or ask for pain medication before doing incentive spirometry. It is harder to take a deep breath if you are having  pain. AFTER USE  Rest and breathe slowly and easily.  It can be helpful to keep track of a log of your progress. Your caregiver can provide you with a simple table to help with this. If you are using the spirometer at home, follow these instructions: [name] IF:   You are having difficultly using the spirometer.  You have trouble using the spirometer as often as instructed.  Your pain medication is not giving enough relief while using the spirometer.  You develop fever of 100.5 F (38.1 C) or higher. SEEK IMMEDIATE MEDICAL CARE IF:   You cough up bloody sputum that had not been present before.  You develop fever of 102 F (38.9 C) or greater.  You develop worsening pain at or near the incision site. MAKE SURE YOU:   Understand these instructions.  Will watch your condition.  Will get help right away if you are not doing well or get worse. Document Released: 11/22/2006 Document Revised: 10/04/2011 Document Reviewed: 01/23/2007 [facility] Patient Information 2014 [place], [place].   ________________________________________________________________________

## 2018-08-07 NOTE — Pre-Procedure Instructions (Signed)
The following are in epic: Last office visit note 01/31/2018 Dr. Rinaldo Cloud EKG 01/31/2018

## 2018-08-08 ENCOUNTER — Encounter (HOSPITAL_COMMUNITY)
Admission: RE | Admit: 2018-08-08 | Discharge: 2018-08-08 | Disposition: A | Discharge status: Home / Self Care | Payer: Medicare Other | Source: Ambulatory Visit | Attending: Orthopedic Surgery | Admitting: Orthopedic Surgery

## 2018-08-08 ENCOUNTER — Telehealth: Payer: Self-pay | Admitting: Cardiology

## 2018-08-08 ENCOUNTER — Other Ambulatory Visit: Payer: Self-pay

## 2018-08-08 ENCOUNTER — Encounter (HOSPITAL_COMMUNITY): Payer: Self-pay

## 2018-08-08 DIAGNOSIS — M1711 Unilateral primary osteoarthritis, right knee: Secondary | ICD-10-CM | POA: Insufficient documentation

## 2018-08-08 DIAGNOSIS — Z01812 Encounter for preprocedural laboratory examination: Secondary | ICD-10-CM | POA: Diagnosis not present

## 2018-08-08 HISTORY — DX: Acute embolism and thrombosis of unspecified deep veins of unspecified lower extremity: I82.409

## 2018-08-08 HISTORY — DX: Venous insufficiency (chronic) (peripheral): I87.2

## 2018-08-08 HISTORY — DX: Urgency of urination: R39.15

## 2018-08-08 HISTORY — DX: Dyspnea, unspecified: R06.00

## 2018-08-08 HISTORY — DX: Deficiency of other specified B group vitamins: E53.8

## 2018-08-08 HISTORY — DX: Nocturia: R35.1

## 2018-08-08 HISTORY — DX: Chronic ischemic heart disease, unspecified: I25.9

## 2018-08-08 HISTORY — DX: Frequency of micturition: R35.0

## 2018-08-08 HISTORY — DX: Unspecified right bundle-branch block: I45.10

## 2018-08-08 HISTORY — DX: Anemia, unspecified: D64.9

## 2018-08-08 LAB — CBC WITH DIFFERENTIAL/PLATELET
Abs Immature Granulocytes: 0.02 10*3/uL (ref 0.00–0.07)
BASOS ABS: 0.1 10*3/uL (ref 0.0–0.1)
Basophils Relative: 1 %
Eosinophils Absolute: 0.2 10*3/uL (ref 0.0–0.5)
Eosinophils Relative: 3 %
HCT: 40.4 % (ref 39.0–52.0)
HEMOGLOBIN: 13 g/dL (ref 13.0–17.0)
Immature Granulocytes: 0 %
LYMPHS PCT: 27 %
Lymphs Abs: 2.1 10*3/uL (ref 0.7–4.0)
MCH: 30.8 pg (ref 26.0–34.0)
MCHC: 32.2 g/dL (ref 30.0–36.0)
MCV: 95.7 fL (ref 80.0–100.0)
Monocytes Absolute: 0.6 10*3/uL (ref 0.1–1.0)
Monocytes Relative: 8 %
NEUTROS ABS: 4.9 10*3/uL (ref 1.7–7.7)
Neutrophils Relative %: 61 %
Platelets: 194 10*3/uL (ref 150–400)
RBC: 4.22 MIL/uL (ref 4.22–5.81)
RDW: 14.3 % (ref 11.5–15.5)
WBC: 7.9 10*3/uL (ref 4.0–10.5)
nRBC: 0 % (ref 0.0–0.2)

## 2018-08-08 LAB — COMPREHENSIVE METABOLIC PANEL
ALBUMIN: 4.1 g/dL (ref 3.5–5.0)
ALT: 19 U/L (ref 0–44)
AST: 25 U/L (ref 15–41)
Alkaline Phosphatase: 57 U/L (ref 38–126)
Anion gap: 7 (ref 5–15)
BUN: 22 mg/dL (ref 8–23)
CO2: 27 mmol/L (ref 22–32)
Calcium: 9.6 mg/dL (ref 8.9–10.3)
Chloride: 105 mmol/L (ref 98–111)
Creatinine, Ser: 1.61 mg/dL — ABNORMAL HIGH (ref 0.61–1.24)
GFR calc Af Amer: 45 mL/min — ABNORMAL LOW (ref >60)
GFR calc non Af Amer: 39 mL/min — ABNORMAL LOW (ref >60)
Glucose, Bld: 93 mg/dL (ref 70–99)
Potassium: 4.5 mmol/L (ref 3.5–5.1)
Sodium: 139 mmol/L (ref 135–145)
Total Bilirubin: 0.9 mg/dL (ref 0.3–1.2)
Total Protein: 7.2 g/dL (ref 6.5–8.1)

## 2018-08-08 LAB — SURGICAL PCR SCREEN
MRSA, PCR: NEGATIVE
Staphylococcus aureus: NEGATIVE

## 2018-08-08 NOTE — Pre-Procedure Instructions (Signed)
CMP results 08/08/2018 sent to Dr. Ronnie Derby via epic.  Chart sent to Ronald Dunn. Question need for cardiac clearance last office visit with Dr. Bettina Gavia 01/31/2018 Creatinine 1.61 on 08/08/2018

## 2018-08-08 NOTE — Telephone Encounter (Signed)
Yes he can stop his aspirin for knee surgery

## 2018-08-08 NOTE — Telephone Encounter (Signed)
Please advise. Thanks.  

## 2018-08-08 NOTE — Telephone Encounter (Signed)
No answer on patient's home phone. Left message on patient's cell phone to return call.

## 2018-08-08 NOTE — Telephone Encounter (Signed)
Patient called and is having knee surgery on the 20th and the surgeon asked for him to discontinue the aspirin from now til next week.  Is this ok?? Please call patient.

## 2018-08-09 NOTE — Telephone Encounter (Signed)
Patient and his wife instructed that it is OK to hold Aspirin prior to knee surgery per Dr Bettina Gavia.  Patient verbalized understanding.

## 2018-08-10 NOTE — Progress Notes (Signed)
Anesthesia Chart Review   Case:  [id number] Date/Time:  08/14/18 1105   Procedure:  TOTAL KNEE ARTHROPLASTY (Right )   Anesthesia type:  Spinal   Pre-op diagnosis:  Primary osteoarthritis right knee   Location:  [place] / [facility]   Surgeon:  Vickey Huger, MD      DISCUSSION: 85 yo with h/o HTN, CAD s/p CABG, remote h/o paroxysmal A-fib, CKD, HLD, incomplete RBBB, h/o DVT scheduled for above surgery on 08/14/18 with Dr. Vickey Huger.   Pt s/p CABG 08/26/2015.  He is followed by cardiology, Dr. Shirlee More.  He was last seen on 01/31/18.  At this visit Dr. Bettina Gavia reports pt remains very active without angina, dypnea, palpations, or syncope. Per his note pt stable, continue current medical treatment. Dr. Bettina Gavia aware of upcoming surgery, Dr. Bettina Gavia states it is ok to stop his ASA 81mg  prior to surgery per telephone call on 08/08/18.  Clearance received from Dr. Bettina Gavia 05/17/18 stating pt is cleared from cardiac standpoint (in [place]).   Creatinine at PST 1.61.  Spoke with PCP Dr. Lin Landsman who reports 3 months ago creatinine was normal.  He reports this could be attributed to aldactone and holding this medication and rechecking creatinine next week may be all that needs to be done, he is happy to see him prior to surgery to further workup.  Contacted surgeon Dr. Ronnie Derby to make them aware of new renal insufficiency.    Discussed with Dr. Ruel Favors PA, Carlyon Shadow, PA-C, who states they will advise pt to hold aldactone and advise pt to follow up with PCP, recommends proceeding with surgery with close monitoring of kidney function.  Will recheck creatinine DOS.    Discussed with Dr. Roanna Banning.   Pt can proceed with planned procedure barring acute status change.  VS: BP 129/75   Pulse 77   Temp 36.6 C (Oral)   Resp 16   Ht 5\' 10"  (1.778 m)   Wt 74.2 kg   SpO2 98%   BMI 23.46 kg/m   PROVIDERS: Angelina Sheriff, MD is PCP  Shirlee More, MD is Cardiologist LABS: Labs reviewed: Acceptable for  surgery. (all labs ordered are listed, but only abnormal results are displayed)  Labs Reviewed  COMPREHENSIVE METABOLIC PANEL - Abnormal; Notable for the following components:      Result Value   Creatinine, Ser 1.61 (*)    GFR calc non Af Amer 39 (*)    GFR calc Af Amer 45 (*)    All other components within normal limits  SURGICAL PCR SCREEN  CBC WITH DIFFERENTIAL/PLATELET     IMAGES:   EKG: Rate 63bpm Normal sinus rhythm Incomplete right bundle branch block Left anterior fascicular block Abnormal ECG  CV: Echo 09/11/15 Findings Mitral Valve: Structurally normal mitral valve. Mild mitral regurgitation by color flow doppler examination. Aortic Valve: The aortic valve leaflets were not well visualized. Tricuspid Valve. Tricuspid valve is structurally normal. Mild tricuspid regurgitation by color flow doppler examination. Pulmonic Valve: The pulmonic valve was not well visualized No Doppler evidence of pulmonic stenosis or insufficiency. Left Atrium: Mild Left atrial enlargement. Left Ventricle: Normal left ventricular size and systolic function with no appreciable segmental abnormality. Ejection fraction is visually estimated at 50-55% Right Atrium: Normal right atrium. Right Ventricle: Normal right ventricle structure and function. Pericardial Effusion No evidence of pericardial effusion. Pleural Effusion Left pleural effusion.  Past Medical History:  Diagnosis Date  . Abnormal stress echocardiogram 08/16/2015   Overview:  With  post exercise LV dilation  . Arthritis 04/14/2017  . Borderline anemia 05/2018   no medication prescribed at this time  . CKD (chronic kidney disease) 04/14/2017  . Coronary artery disease involving native coronary artery of native heart 04/14/2017  . Coronary artery disease with stable angina pectoris ([place]) 09/01/2015   pt denies chest pain  . DVT (deep venous thrombosis) ([facility])    Right leg  . Dyspnea 2017  . Essential hypertension  07/22/2015  . Gout 04/14/2017  . Hyperlipidemia 07/22/2015  . Incomplete right bundle branch block (RBBB)    Noted on EKG 01/31/2018  . 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 Leukocytosis 04/14/2017  . Obesity 04/14/2017  . On amiodarone therapy 09/01/2015  . PAF (paroxysmal atrial fibrillation) ([place]) 09/01/2015   pt unaware  . Pleural effusion 09/22/2015   Overview:  Left, post CABG with US guided thoracentesis  . Premature ventricular contractions (PVCs) (VPCs) 04/14/2017  . S/P CABG (coronary artery bypass graft) 04/14/2017  . Skin cancer (melanoma) ([place]) 04/14/2017  . Vitamin B 12 deficiency     Past Surgical History:  Procedure Laterality Date  . CARDIAC CATHETERIZATION    . COLONOSCOPY    . CORNEAL TRANSPLANT Bilateral   . CORONARY ARTERY BYPASS GRAFT  08/26/2015  . FOOT SURGERY Right     MEDICATIONS: . sulfamethoxazole-trimethoprim (BACTRIM DS,SEPTRA DS) 800-160 MG tablet  . allopurinol (ZYLOPRIM) 100 MG tablet  . aspirin EC 81 MG tablet  . ezetimibe (ZETIA) 10 MG tablet  . GLUCOSAMINE-CHONDROITIN PO  . loteprednol (LOTEMAX) 0.5 % ophthalmic suspension  . Multiple Vitamin (MULTIVITAMIN) capsule  . spironolactone (ALDACTONE) 25 MG tablet   No current facility-administered medications for this encounter.     Maia Plan [facility] Pre-Surgical Testing [phone] 08/10/18 4:19 PM

## 2018-08-10 NOTE — Anesthesia Preprocedure Evaluation (Addendum)
Anesthesia Evaluation  Patient identified by MRN, date of birth, ID band Patient awake    Reviewed: Allergy & Precautions, NPO status , Patient's Chart, lab work & pertinent test results  Airway Mallampati: I  TM Distance: >3 FB Neck ROM: Full    Dental no notable dental hx. (+) Teeth Intact, Dental Advisory Given   Pulmonary neg pulmonary ROS,    Pulmonary exam normal breath sounds clear to auscultation       Cardiovascular hypertension, + angina + CAD, + CABG and + DVT  Normal cardiovascular exam+ dysrhythmias Atrial Fibrillation  Rhythm:Regular Rate:Normal  TTE 2017 Mild LAE, remaining chambers are normal in size Normal LV systolic function, LVEF 55 % Valves appear structurally normal, Mild MR/TR Large left pleural effusion, trace of pericardial effusion of no clinical significance   Neuro/Psych negative neurological ROS  negative psych ROS   GI/Hepatic negative GI ROS, Neg liver ROS,   Endo/Other  negative endocrine ROS  Renal/GU Renal InsufficiencyRenal disease  negative genitourinary   Musculoskeletal  (+) Arthritis , Osteoarthritis,    Abdominal   Peds  Hematology negative hematology ROS (+)   Anesthesia Other Findings Clearance received from Dr. Bettina Gavia 05/17/18 stating pt is cleared from cardiac standpoint.   Reproductive/Obstetrics                           Anesthesia Physical Anesthesia Plan  ASA: III  Anesthesia Plan: Spinal and Regional   Post-op Pain Management:  Regional for Post-op pain   Induction:   PONV Risk Score and Plan: 1 and Treatment may vary due to age or medical condition, Ondansetron and Dexamethasone  Airway Management Planned: Natural Airway  Additional Equipment:   Intra-op Plan:   Post-operative Plan:   Informed Consent: I have reviewed the patients History and Physical, chart, labs and discussed the procedure including the risks, benefits and  alternatives for the proposed anesthesia with the patient or authorized representative who has indicated his/her understanding and acceptance.     Dental advisory given  Plan Discussed with: CRNA  Anesthesia Plan Comments: (See PST note 08/08/18, Konrad Felix, PA-C)       Anesthesia Quick Evaluation

## 2018-08-11 ENCOUNTER — Encounter (HOSPITAL_COMMUNITY): Payer: Self-pay

## 2018-08-11 NOTE — Pre-Procedure Instructions (Signed)
Last office visit note Dr. Humphrey Rolls 01/20/2018.

## 2018-08-13 MED ORDER — BUPIVACAINE LIPOSOME 1.3 % IJ SUSP
20.0000 mL | Freq: Once | INTRAMUSCULAR | Status: DC
  Filled 2018-08-13: qty 20

## 2018-08-14 ENCOUNTER — Ambulatory Visit (HOSPITAL_COMMUNITY): Payer: Medicare Other | Admitting: Physician Assistant

## 2018-08-14 ENCOUNTER — Observation Stay (HOSPITAL_COMMUNITY)
Admission: RE | Admit: 2018-08-14 | Discharge: 2018-08-15 | Disposition: A | Discharge status: Home / Self Care | Payer: Medicare Other | Attending: Orthopedic Surgery | Admitting: Orthopedic Surgery

## 2018-08-14 ENCOUNTER — Other Ambulatory Visit: Payer: Self-pay

## 2018-08-14 ENCOUNTER — Encounter (HOSPITAL_COMMUNITY)
Admission: RE | Disposition: A | Discharge status: Home / Self Care | Payer: Self-pay | Source: Home / Self Care | Attending: Orthopedic Surgery

## 2018-08-14 ENCOUNTER — Encounter (HOSPITAL_COMMUNITY): Payer: Self-pay

## 2018-08-14 ENCOUNTER — Ambulatory Visit (HOSPITAL_COMMUNITY): Payer: Medicare Other | Admitting: Anesthesiology

## 2018-08-14 DIAGNOSIS — Z951 Presence of aortocoronary bypass graft: Secondary | ICD-10-CM | POA: Diagnosis not present

## 2018-08-14 DIAGNOSIS — Z85820 Personal history of malignant melanoma of skin: Secondary | ICD-10-CM | POA: Diagnosis not present

## 2018-08-14 DIAGNOSIS — Z947 Corneal transplant status: Secondary | ICD-10-CM | POA: Diagnosis not present

## 2018-08-14 DIAGNOSIS — M109 Gout, unspecified: Secondary | ICD-10-CM | POA: Insufficient documentation

## 2018-08-14 DIAGNOSIS — N189 Chronic kidney disease, unspecified: Secondary | ICD-10-CM | POA: Diagnosis not present

## 2018-08-14 DIAGNOSIS — M1711 Unilateral primary osteoarthritis, right knee: Secondary | ICD-10-CM | POA: Diagnosis not present

## 2018-08-14 DIAGNOSIS — G8918 Other acute postprocedural pain: Secondary | ICD-10-CM | POA: Diagnosis not present

## 2018-08-14 DIAGNOSIS — Z96659 Presence of unspecified artificial knee joint: Secondary | ICD-10-CM

## 2018-08-14 DIAGNOSIS — I129 Hypertensive chronic kidney disease with stage 1 through stage 4 chronic kidney disease, or unspecified chronic kidney disease: Secondary | ICD-10-CM | POA: Diagnosis not present

## 2018-08-14 DIAGNOSIS — Z96651 Presence of right artificial knee joint: Secondary | ICD-10-CM

## 2018-08-14 DIAGNOSIS — E1122 Type 2 diabetes mellitus with diabetic chronic kidney disease: Secondary | ICD-10-CM | POA: Diagnosis not present

## 2018-08-14 DIAGNOSIS — M25561 Pain in right knee: Secondary | ICD-10-CM | POA: Diagnosis present

## 2018-08-14 DIAGNOSIS — I2510 Atherosclerotic heart disease of native coronary artery without angina pectoris: Secondary | ICD-10-CM | POA: Diagnosis not present

## 2018-08-14 DIAGNOSIS — Z79899 Other long term (current) drug therapy: Secondary | ICD-10-CM | POA: Diagnosis not present

## 2018-08-14 DIAGNOSIS — E785 Hyperlipidemia, unspecified: Secondary | ICD-10-CM | POA: Insufficient documentation

## 2018-08-14 DIAGNOSIS — Z7982 Long term (current) use of aspirin: Secondary | ICD-10-CM | POA: Insufficient documentation

## 2018-08-14 DIAGNOSIS — Z86718 Personal history of other venous thrombosis and embolism: Secondary | ICD-10-CM | POA: Diagnosis not present

## 2018-08-14 DIAGNOSIS — M25761 Osteophyte, right knee: Secondary | ICD-10-CM | POA: Insufficient documentation

## 2018-08-14 HISTORY — PX: TOTAL KNEE ARTHROPLASTY: SHX125

## 2018-08-14 SURGERY — ARTHROPLASTY, KNEE, TOTAL
Anesthesia: Regional | Site: Knee | Laterality: Right

## 2018-08-14 MED ORDER — PHENYLEPHRINE HCL 10 MG/ML IJ SOLN
INTRAMUSCULAR | Status: AC
  Filled 2018-08-14: qty 3

## 2018-08-14 MED ORDER — CHLORHEXIDINE GLUCONATE 4 % EX LIQD: 60.0000 mL | Freq: Once | CUTANEOUS | Status: DC

## 2018-08-14 MED ORDER — SODIUM CHLORIDE 0.9% FLUSH
INTRAVENOUS | Status: DC | PRN
  Administered 2018-08-14: 20 mL

## 2018-08-14 MED ORDER — FLEET ENEMA 7-19 GM/118ML RE ENEM: 1.0000 | ENEMA | Freq: Once | RECTAL | Status: DC | PRN

## 2018-08-14 MED ORDER — PROPOFOL 500 MG/50ML IV EMUL
INTRAVENOUS | Status: DC | PRN
  Administered 2018-08-14: 25 ug/kg/min via INTRAVENOUS

## 2018-08-14 MED ORDER — MIDAZOLAM HCL 2 MG/2ML IJ SOLN
0.5000 mg | INTRAMUSCULAR | Status: DC
  Filled 2018-08-14: qty 2

## 2018-08-14 MED ORDER — SODIUM CHLORIDE 0.9 % IR SOLN
Status: DC | PRN
  Administered 2018-08-14 (×2): 1000 mL

## 2018-08-14 MED ORDER — ONDANSETRON HCL 4 MG/2ML IJ SOLN: 4.0000 mg | Freq: Four times a day (QID) | INTRAMUSCULAR | Status: DC | PRN

## 2018-08-14 MED ORDER — PHENYLEPHRINE 40 MCG/ML (10ML) SYRINGE FOR IV PUSH (FOR BLOOD PRESSURE SUPPORT)
PREFILLED_SYRINGE | INTRAVENOUS | Status: AC
  Filled 2018-08-14: qty 20

## 2018-08-14 MED ORDER — LACTATED RINGERS IV SOLN
INTRAVENOUS | Status: DC
  Administered 2018-08-14 (×2): via INTRAVENOUS

## 2018-08-14 MED ORDER — FENTANYL CITRATE (PF) 100 MCG/2ML IJ SOLN
50.0000 ug | INTRAMUSCULAR | Status: DC
  Administered 2018-08-14: 50 ug via INTRAVENOUS
  Filled 2018-08-14: qty 2

## 2018-08-14 MED ORDER — TRAMADOL HCL 50 MG PO TABS
50.0000 mg | ORAL_TABLET | Freq: Four times a day (QID) | ORAL | Status: DC
  Administered 2018-08-15 (×3): 50 mg via ORAL
  Filled 2018-08-14 (×3): qty 1

## 2018-08-14 MED ORDER — OXYCODONE HCL 5 MG PO TABS
5.0000 mg | ORAL_TABLET | ORAL | Status: DC | PRN
  Administered 2018-08-14: 5 mg via ORAL
  Filled 2018-08-14 (×2): qty 1

## 2018-08-14 MED ORDER — DOCUSATE SODIUM 100 MG PO CAPS
100.0000 mg | ORAL_CAPSULE | Freq: Two times a day (BID) | ORAL | Status: DC
  Administered 2018-08-14 – 2018-08-15 (×2): 100 mg via ORAL
  Filled 2018-08-14 (×2): qty 1

## 2018-08-14 MED ORDER — EZETIMIBE 10 MG PO TABS
10.0000 mg | ORAL_TABLET | Freq: Every day | ORAL | Status: DC
  Administered 2018-08-14 – 2018-08-15 (×2): 10 mg via ORAL
  Filled 2018-08-14 (×2): qty 1

## 2018-08-14 MED ORDER — FERROUS SULFATE 325 (65 FE) MG PO TABS
325.0000 mg | ORAL_TABLET | Freq: Three times a day (TID) | ORAL | Status: DC
  Administered 2018-08-14 – 2018-08-15 (×3): 325 mg via ORAL
  Filled 2018-08-14 (×3): qty 1

## 2018-08-14 MED ORDER — BISACODYL 5 MG PO TBEC: 5.0000 mg | DELAYED_RELEASE_TABLET | Freq: Every day | ORAL | Status: DC | PRN

## 2018-08-14 MED ORDER — ACETAMINOPHEN 500 MG PO TABS
1000.0000 mg | ORAL_TABLET | Freq: Once | ORAL | Status: AC
  Administered 2018-08-14: 1000 mg via ORAL
  Filled 2018-08-14: qty 2

## 2018-08-14 MED ORDER — BUPIVACAINE IN DEXTROSE 0.75-8.25 % IT SOLN
INTRATHECAL | Status: DC | PRN
  Administered 2018-08-14: 1.6 mL via INTRATHECAL

## 2018-08-14 MED ORDER — MENTHOL 3 MG MT LOZG: 1.0000 | LOZENGE | OROMUCOSAL | Status: DC | PRN

## 2018-08-14 MED ORDER — SODIUM CHLORIDE (PF) 0.9 % IJ SOLN
INTRAMUSCULAR | Status: AC
  Filled 2018-08-14: qty 20

## 2018-08-14 MED ORDER — BUPIVACAINE-EPINEPHRINE 0.5% -1:200000 IJ SOLN
INTRAMUSCULAR | Status: DC | PRN
  Administered 2018-08-14: 30 mL

## 2018-08-14 MED ORDER — SPIRONOLACTONE 25 MG PO TABS
25.0000 mg | ORAL_TABLET | Freq: Every day | ORAL | Status: DC
  Administered 2018-08-15: 25 mg via ORAL
  Filled 2018-08-14 (×2): qty 1

## 2018-08-14 MED ORDER — BUPIVACAINE LIPOSOME 1.3 % IJ SUSP
INTRAMUSCULAR | Status: DC | PRN
  Administered 2018-08-14: 20 mL

## 2018-08-14 MED ORDER — STERILE WATER FOR INJECTION IJ SOLN
INTRAMUSCULAR | Status: DC | PRN
  Administered 2018-08-14: 2000 mL

## 2018-08-14 MED ORDER — ALLOPURINOL 100 MG PO TABS
100.0000 mg | ORAL_TABLET | Freq: Two times a day (BID) | ORAL | Status: DC
  Administered 2018-08-14 – 2018-08-15 (×2): 100 mg via ORAL
  Filled 2018-08-14 (×2): qty 1

## 2018-08-14 MED ORDER — TRANEXAMIC ACID-NACL 1000-0.7 MG/100ML-% IV SOLN: 1000.0000 mg | INTRAVENOUS | Status: DC

## 2018-08-14 MED ORDER — BUPIVACAINE-EPINEPHRINE (PF) 0.25% -1:200000 IJ SOLN
INTRAMUSCULAR | Status: AC
  Filled 2018-08-14: qty 30

## 2018-08-14 MED ORDER — METHOCARBAMOL 500 MG IVPB - SIMPLE MED
500.0000 mg | Freq: Four times a day (QID) | INTRAVENOUS | Status: DC | PRN
  Filled 2018-08-14: qty 50

## 2018-08-14 MED ORDER — HYDROMORPHONE HCL 1 MG/ML IJ SOLN: 0.5000 mg | INTRAMUSCULAR | Status: DC | PRN

## 2018-08-14 MED ORDER — ROCURONIUM BROMIDE 100 MG/10ML IV SOLN
INTRAVENOUS | Status: AC
  Filled 2018-08-14: qty 1

## 2018-08-14 MED ORDER — LOTEPREDNOL ETABONATE 0.5 % OP SUSP
1.0000 [drp] | Freq: Every morning | OPHTHALMIC | Status: DC
  Administered 2018-08-15: 1 [drp] via OPHTHALMIC
  Filled 2018-08-14: qty 5

## 2018-08-14 MED ORDER — ONDANSETRON HCL 4 MG/2ML IJ SOLN
INTRAMUSCULAR | Status: AC
  Filled 2018-08-14: qty 6

## 2018-08-14 MED ORDER — TRANEXAMIC ACID-NACL 1000-0.7 MG/100ML-% IV SOLN
INTRAVENOUS | Status: AC
  Filled 2018-08-14: qty 100

## 2018-08-14 MED ORDER — PROPOFOL 10 MG/ML IV BOLUS
INTRAVENOUS | Status: AC
  Filled 2018-08-14: qty 60

## 2018-08-14 MED ORDER — TRANEXAMIC ACID-NACL 1000-0.7 MG/100ML-% IV SOLN
1000.0000 mg | Freq: Once | INTRAVENOUS | Status: AC
  Administered 2018-08-14: 1000 mg via INTRAVENOUS
  Filled 2018-08-14: qty 100

## 2018-08-14 MED ORDER — CEFAZOLIN SODIUM-DEXTROSE 2-4 GM/100ML-% IV SOLN
2.0000 g | Freq: Four times a day (QID) | INTRAVENOUS | Status: AC
  Administered 2018-08-14 – 2018-08-15 (×2): 2 g via INTRAVENOUS
  Filled 2018-08-14 (×2): qty 100

## 2018-08-14 MED ORDER — METOCLOPRAMIDE HCL 5 MG/ML IJ SOLN: 5.0000 mg | Freq: Three times a day (TID) | INTRAMUSCULAR | Status: DC | PRN

## 2018-08-14 MED ORDER — ONDANSETRON HCL 4 MG PO TABS: 4.0000 mg | ORAL_TABLET | Freq: Four times a day (QID) | ORAL | Status: DC | PRN

## 2018-08-14 MED ORDER — ZOLPIDEM TARTRATE 5 MG PO TABS: 5.0000 mg | ORAL_TABLET | Freq: Every evening | ORAL | Status: DC | PRN

## 2018-08-14 MED ORDER — GABAPENTIN 300 MG PO CAPS
300.0000 mg | ORAL_CAPSULE | Freq: Once | ORAL | Status: AC
  Administered 2018-08-14: 300 mg via ORAL
  Filled 2018-08-14: qty 1

## 2018-08-14 MED ORDER — ACETAMINOPHEN 500 MG PO TABS
1000.0000 mg | ORAL_TABLET | Freq: Four times a day (QID) | ORAL | Status: AC
  Administered 2018-08-14 – 2018-08-15 (×4): 1000 mg via ORAL
  Filled 2018-08-14 (×4): qty 2

## 2018-08-14 MED ORDER — PHENOL 1.4 % MT LIQD
1.0000 | OROMUCOSAL | Status: DC | PRN
  Filled 2018-08-14: qty 177

## 2018-08-14 MED ORDER — SENNOSIDES-DOCUSATE SODIUM 8.6-50 MG PO TABS: 1.0000 | ORAL_TABLET | Freq: Every evening | ORAL | Status: DC | PRN

## 2018-08-14 MED ORDER — METOCLOPRAMIDE HCL 5 MG PO TABS: 5.0000 mg | ORAL_TABLET | Freq: Three times a day (TID) | ORAL | Status: DC | PRN

## 2018-08-14 MED ORDER — TRANEXAMIC ACID-NACL 1000-0.7 MG/100ML-% IV SOLN
INTRAVENOUS | Status: DC | PRN
  Administered 2018-08-14: 1000 mg via INTRAVENOUS

## 2018-08-14 MED ORDER — ALUM & MAG HYDROXIDE-SIMETH 200-200-20 MG/5ML PO SUSP: 30.0000 mL | ORAL | Status: DC | PRN

## 2018-08-14 MED ORDER — ONDANSETRON HCL 4 MG/2ML IJ SOLN
INTRAMUSCULAR | Status: DC | PRN
  Administered 2018-08-14: 4 mg via INTRAVENOUS

## 2018-08-14 MED ORDER — DEXAMETHASONE SODIUM PHOSPHATE 10 MG/ML IJ SOLN
10.0000 mg | Freq: Once | INTRAMUSCULAR | Status: AC
  Administered 2018-08-15: 10 mg via INTRAVENOUS
  Filled 2018-08-14: qty 1

## 2018-08-14 MED ORDER — DIPHENHYDRAMINE HCL 12.5 MG/5ML PO ELIX: 12.5000 mg | ORAL_SOLUTION | ORAL | Status: DC | PRN

## 2018-08-14 MED ORDER — EPHEDRINE 5 MG/ML INJ
INTRAVENOUS | Status: AC
  Filled 2018-08-14: qty 30

## 2018-08-14 MED ORDER — DEXAMETHASONE SODIUM PHOSPHATE 10 MG/ML IJ SOLN
INTRAMUSCULAR | Status: AC
  Filled 2018-08-14: qty 3

## 2018-08-14 MED ORDER — PROPOFOL 10 MG/ML IV BOLUS
INTRAVENOUS | Status: DC | PRN
  Administered 2018-08-14 (×4): 20 mg via INTRAVENOUS

## 2018-08-14 MED ORDER — SODIUM CHLORIDE 0.9 % IV SOLN
INTRAVENOUS | Status: DC | PRN
  Administered 2018-08-14: 25 ug/min via INTRAVENOUS

## 2018-08-14 MED ORDER — CEFAZOLIN SODIUM-DEXTROSE 2-4 GM/100ML-% IV SOLN
2.0000 g | INTRAVENOUS | Status: AC
  Administered 2018-08-14: 2 g via INTRAVENOUS
  Filled 2018-08-14: qty 100

## 2018-08-14 MED ORDER — LIDOCAINE 2% (20 MG/ML) 5 ML SYRINGE
INTRAMUSCULAR | Status: AC
  Filled 2018-08-14: qty 15

## 2018-08-14 MED ORDER — FENTANYL CITRATE (PF) 100 MCG/2ML IJ SOLN: 25.0000 ug | INTRAMUSCULAR | Status: DC | PRN

## 2018-08-14 MED ORDER — FENTANYL CITRATE (PF) 250 MCG/5ML IJ SOLN
INTRAMUSCULAR | Status: AC
  Filled 2018-08-14: qty 5

## 2018-08-14 MED ORDER — SODIUM CHLORIDE 0.9 % IV SOLN
INTRAVENOUS | Status: DC
  Administered 2018-08-14 – 2018-08-15 (×2): via INTRAVENOUS

## 2018-08-14 MED ORDER — DEXAMETHASONE SODIUM PHOSPHATE 10 MG/ML IJ SOLN: 8.0000 mg | Freq: Once | INTRAMUSCULAR | Status: DC

## 2018-08-14 MED ORDER — DEXAMETHASONE SODIUM PHOSPHATE 10 MG/ML IJ SOLN
INTRAMUSCULAR | Status: DC | PRN
  Administered 2018-08-14: 8 mg via INTRAVENOUS

## 2018-08-14 MED ORDER — ASPIRIN EC 325 MG PO TBEC
325.0000 mg | DELAYED_RELEASE_TABLET | Freq: Two times a day (BID) | ORAL | Status: DC
  Administered 2018-08-15: 325 mg via ORAL
  Filled 2018-08-14: qty 1

## 2018-08-14 MED ORDER — LIDOCAINE 2% (20 MG/ML) 5 ML SYRINGE
INTRAMUSCULAR | Status: DC | PRN
  Administered 2018-08-14: 40 mg via INTRAVENOUS
  Administered 2018-08-14: 20 mg via INTRAVENOUS

## 2018-08-14 MED ORDER — MIDAZOLAM HCL 2 MG/2ML IJ SOLN
INTRAMUSCULAR | Status: AC
  Filled 2018-08-14: qty 2

## 2018-08-14 MED ORDER — PANTOPRAZOLE SODIUM 40 MG PO TBEC
40.0000 mg | DELAYED_RELEASE_TABLET | Freq: Every day | ORAL | Status: DC
  Administered 2018-08-14 – 2018-08-15 (×2): 40 mg via ORAL
  Filled 2018-08-14 (×2): qty 1

## 2018-08-14 MED ORDER — GABAPENTIN 300 MG PO CAPS
300.0000 mg | ORAL_CAPSULE | Freq: Three times a day (TID) | ORAL | Status: DC
  Administered 2018-08-14 – 2018-08-15 (×2): 300 mg via ORAL
  Filled 2018-08-14 (×2): qty 1

## 2018-08-14 MED ORDER — FENTANYL CITRATE (PF) 250 MCG/5ML IJ SOLN
INTRAMUSCULAR | Status: DC | PRN
  Administered 2018-08-14 (×2): 25 ug via INTRAVENOUS

## 2018-08-14 MED ORDER — METHOCARBAMOL 500 MG PO TABS: 500.0000 mg | ORAL_TABLET | Freq: Four times a day (QID) | ORAL | Status: DC | PRN

## 2018-08-14 SURGICAL SUPPLY — 58 items
BAG ZIPLOCK 12X15 (MISCELLANEOUS) ×3 IMPLANT
BANDAGE ACE 6X5 VEL STRL LF (GAUZE/BANDAGES/DRESSINGS) ×3 IMPLANT
BANDAGE ELASTIC 6 VELCRO ST LF (GAUZE/BANDAGES/DRESSINGS) ×2 IMPLANT
BLADE PATELLA REAM PILOT HOLE (MISCELLANEOUS) ×2 IMPLANT
BLADE SAGITTAL 13X1.27X60 (BLADE) ×2 IMPLANT
BLADE SAGITTAL 13X1.27X60MM (BLADE) ×1
BLADE SAW SGTL 83.5X18.5 (BLADE) ×3 IMPLANT
BLADE SURG 15 STRL LF DISP TIS (BLADE) ×1 IMPLANT
BLADE SURG 15 STRL SS (BLADE) ×2
BLADE SURG SZ10 CARB STEEL (BLADE) ×6 IMPLANT
BOWL SMART MIX CTS (DISPOSABLE) ×3 IMPLANT
CEMENT BONE SIMPLEX SPEEDSET (Cement) ×6 IMPLANT
CLOSURE WOUND 1/2 X4 (GAUZE/BANDAGES/DRESSINGS) ×1
COVER SURGICAL LIGHT HANDLE (MISCELLANEOUS) ×3 IMPLANT
COVER WAND RF STERILE (DRAPES) ×2 IMPLANT
CUFF TOURN SGL QUICK 34 (TOURNIQUET CUFF) ×2
CUFF TRNQT CYL 34X4X40X1 (TOURNIQUET CUFF) ×1 IMPLANT
DECANTER SPIKE VIAL GLASS SM (MISCELLANEOUS) ×6 IMPLANT
DRAPE INCISE IOBAN 66X45 STRL (DRAPES) ×6 IMPLANT
DRAPE U-SHAPE 47X51 STRL (DRAPES) ×3 IMPLANT
DRSG AQUACEL AG ADV 3.5X10 (GAUZE/BANDAGES/DRESSINGS) ×3 IMPLANT
DURAPREP 26ML APPLICATOR (WOUND CARE) ×6 IMPLANT
ELECT REM PT RETURN 15FT ADLT (MISCELLANEOUS) ×3 IMPLANT
FEMUR  CMT CCR STD SZ10 R KNEE (Knees) ×2 IMPLANT
FEMUR CMT CCR STD SZ10 R KNEE (Knees) ×1 IMPLANT
FEMUR CMTD CCR STD SZ10 R KNEE (Knees) IMPLANT
GLOVE BIOGEL M STRL SZ7.5 (GLOVE) ×3 IMPLANT
GLOVE BIOGEL PI IND STRL 7.5 (GLOVE) ×1 IMPLANT
GLOVE BIOGEL PI IND STRL 8.5 (GLOVE) ×2 IMPLANT
GLOVE BIOGEL PI INDICATOR 7.5 (GLOVE) ×2
GLOVE BIOGEL PI INDICATOR 8.5 (GLOVE) ×4
GLOVE SURG ORTHO 8.0 STRL STRW (GLOVE) ×9 IMPLANT
GOWN STRL REUS W/ TWL XL LVL3 (GOWN DISPOSABLE) ×2 IMPLANT
GOWN STRL REUS W/TWL XL LVL3 (GOWN DISPOSABLE) ×4
HANDPIECE INTERPULSE COAX TIP (DISPOSABLE) ×2
HOLDER FOLEY CATH W/STRAP (MISCELLANEOUS) ×3 IMPLANT
HOOD PEEL AWAY FLYTE STAYCOOL (MISCELLANEOUS) ×9 IMPLANT
INSERT TIBIAL PLY R GH10-11X10 (Insert) ×2 IMPLANT
MANIFOLD NEPTUNE II (INSTRUMENTS) ×3 IMPLANT
NS IRRIG 1000ML POUR BTL (IV SOLUTION) ×3 IMPLANT
PACK TOTAL KNEE CUSTOM (KITS) ×3 IMPLANT
PROTECTOR NERVE ULNAR (MISCELLANEOUS) ×3 IMPLANT
SET HNDPC FAN SPRY TIP SCT (DISPOSABLE) ×1 IMPLANT
STEM POLY PAT PLY 38M KNEE (Knees) ×2 IMPLANT
STEM TIBIA 5 DEG SZ G R KNEE (Knees) IMPLANT
STRIP CLOSURE SKIN 1/2X4 (GAUZE/BANDAGES/DRESSINGS) ×2 IMPLANT
SUT BONE WAX W31G (SUTURE) ×3 IMPLANT
SUT MNCRL AB 3-0 PS2 18 (SUTURE) ×3 IMPLANT
SUT STRATAFIX 0 PDS 27 VIOLET (SUTURE) ×3
SUT STRATAFIX PDS+ 0 24IN (SUTURE) ×3 IMPLANT
SUT VIC AB 1 CT1 36 (SUTURE) ×3 IMPLANT
SUTURE STRATFX 0 PDS 27 VIOLET (SUTURE) ×1 IMPLANT
SYR CONTROL 10ML LL (SYRINGE) ×6 IMPLANT
TIBIA STEM 5 DEG SZ G R KNEE (Knees) ×3 IMPLANT
TRAY FOLEY MTR SLVR 16FR STAT (SET/KITS/TRAYS/PACK) ×3 IMPLANT
WATER STERILE IRR 1000ML POUR (IV SOLUTION) ×6 IMPLANT
WRAP KNEE MAXI GEL POST OP (GAUZE/BANDAGES/DRESSINGS) ×3 IMPLANT
YANKAUER SUCT BULB TIP 10FT TU (MISCELLANEOUS) ×3 IMPLANT

## 2018-08-14 NOTE — Plan of Care (Signed)
Plan of care 

## 2018-08-14 NOTE — H&P (Signed)
Ronald Dunn MRN:  [id number] DOB/SEX:  1932/02/09/male  CHIEF COMPLAINT:  Painful right Knee  HISTORY: Patient is a 85 y.o. male presented with a history of pain in the right knee. Onset of symptoms was gradual starting a few years ago with gradually worsening course since that time. Patient has been treated conservatively with over-the-counter NSAIDs and activity modification. Patient currently rates pain in the knee at 10 out of 10 with activity. There is pain at night.  PAST MEDICAL HISTORY: Patient Active Problem List   Diagnosis Date Noted  . Coronary artery disease involving native coronary artery of native heart 04/14/2017  . Leukocytosis 04/14/2017  . S/P CABG (coronary artery bypass graft) 04/14/2017  . CKD (chronic kidney disease) 04/14/2017  . Arthritis 04/14/2017  . Skin cancer (melanoma) ([place]) 04/14/2017  . Gout 04/14/2017  . 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 Obesity 04/14/2017  . Premature ventricular contractions (PVCs) (VPCs) 04/14/2017  . Pleural effusion 09/22/2015  . Coronary artery disease with stable angina pectoris ([place]) 09/01/2015  . On amiodarone therapy 09/01/2015  . PAF (paroxysmal atrial fibrillation) ([place]) 09/01/2015  . Abnormal stress echocardiogram 08/16/2015  . Essential hypertension 07/22/2015  . Hyperlipidemia 07/22/2015   Past Medical History:  Diagnosis Date  . Abnormal stress echocardiogram 08/16/2015   Overview:  With post exercise LV dilation  . Arthritis 04/14/2017  . Borderline anemia 05/2018   no medication prescribed at this time  . Chronic ischemic heart disease   . CKD (chronic kidney disease) 04/14/2017  . Coronary artery disease involving native coronary artery of native heart 04/14/2017  . Coronary artery disease with stable angina pectoris ([place]) 09/01/2015   pt denies chest pain  . DVT (deep venous thrombosis) ([facility])    Right leg  . Dyspnea 2017  . Essential hypertension 07/22/2015  . Gout 04/14/2017  . Hyperlipidemia  07/22/2015  . Incomplete right bundle branch block (RBBB)    Noted on EKG 01/31/2018  . 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 Leukocytosis 04/14/2017  . Nocturia   . Obesity 04/14/2017  . On amiodarone therapy 09/01/2015  . PAF (paroxysmal atrial fibrillation) ([place]) 09/01/2015   pt unaware  . Pleural effusion 09/22/2015   Overview:  Left, post CABG with US guided thoracentesis  . Premature ventricular contractions (PVCs) (VPCs) 04/14/2017  . S/P CABG (coronary artery bypass graft) 04/14/2017  . Skin cancer (melanoma) ([place]) 04/14/2017  . Urinary frequency   . Urinary urgency   . Venous insufficiency   . Vitamin B 12 deficiency    Past Surgical History:  Procedure Laterality Date  . CARDIAC CATHETERIZATION    . COLONOSCOPY    . CORNEAL TRANSPLANT Bilateral   . CORONARY ARTERY BYPASS GRAFT  08/26/2015  . FOOT SURGERY Right      MEDICATIONS:   Medications Prior to Admission  Medication Sig Dispense Refill Last Dose  . allopurinol (ZYLOPRIM) 100 MG tablet Take 100 mg by mouth 2 (two) times daily.    Taking  . aspirin EC 81 MG tablet Take 81 mg by mouth daily.    Taking  . ezetimibe (ZETIA) 10 MG tablet Take 1 tablet (10 mg total) by mouth daily. 30 tablet 1   . GLUCOSAMINE-CHONDROITIN PO Take 1 tablet by mouth 2 (two) times daily.    Taking  . loteprednol (LOTEMAX) 0.5 % ophthalmic suspension Place 1 drop into both eyes every morning.    Taking  . Multiple Vitamin (MULTIVITAMIN) capsule Take 1 capsule by mouth daily.    Taking  .  spironolactone (ALDACTONE) 25 MG tablet Take 1 tablet (25 mg total) by mouth daily. 90 tablet 4   . sulfamethoxazole-trimethoprim (BACTRIM DS,SEPTRA DS) 800-160 MG tablet Take 1 tablet by mouth 2 (two) times daily.       ALLERGIES:  No Known Allergies  REVIEW OF SYSTEMS:  A comprehensive review of systems was negative except for: Musculoskeletal: positive for arthralgias and bone pain   FAMILY HISTORY:   Family History  Problem Relation Age of Onset  .  Heart failure Father   . Heart disease Father     SOCIAL HISTORY:   Social History   Tobacco Use  . Smoking status: Never Smoker  . Smokeless tobacco: Never Used  Substance Use Topics  . Alcohol use: Yes    Alcohol/week: 1.0 standard drinks    Types: 1 Glasses of wine per week     EXAMINATION:  Vital signs in last 24 hours:    There were no vitals taken for this visit.  General Appearance:    Alert, cooperative, no distress, appears stated age  Head:    Normocephalic, without obvious abnormality, atraumatic  Eyes:    PERRL, conjunctiva/corneas clear, EOM's intact, fundi    benign, both eyes       Ears:    Normal TM's and external ear canals, both ears  Nose:   Nares normal, septum midline, mucosa normal, no drainage    or sinus tenderness  Throat:   Lips, mucosa, and tongue normal; teeth and gums normal  Neck:   Supple, symmetrical, trachea midline, no adenopathy;       thyroid:  No enlargement/tenderness/nodules; no carotid   bruit or JVD  Back:     Symmetric, no curvature, ROM normal, no CVA tenderness  Lungs:     Clear to auscultation bilaterally, respirations unlabored  Chest wall:    No tenderness or deformity  Heart:    Regular rate and rhythm, S1 and S2 normal, no murmur, rub   or gallop  Abdomen:     Soft, non-tender, bowel sounds active all four quadrants,    no masses, no organomegaly  Genitalia:    Normal male without lesion, discharge or tenderness  Rectal:    Normal tone, normal prostate, no masses or tenderness;   guaiac negative stool  Extremities:   Extremities normal, atraumatic, no cyanosis or edema  Pulses:   2+ and symmetric all extremities  Skin:   Skin color, texture, turgor normal, no rashes or lesions  Lymph nodes:   Cervical, supraclavicular, and axillary nodes normal  Neurologic:   CNII-XII intact. Normal strength, sensation and reflexes      throughout    Musculoskeletal:  ROM 0-120, Ligaments intact,  Imaging Review Plain radiographs  demonstrate severe degenerative joint disease of the right knee. The overall alignment is neutral. The bone quality appears to be good for age and reported activity level.  Assessment/Plan: Primary osteoarthritis, right knee   The patient history, physical examination and imaging studies are consistent with advanced degenerative joint disease of the right knee. The patient has failed conservative treatment.  The clearance notes were reviewed.  After discussion with the patient it was felt that Total Knee Replacement was indicated. The procedure,  risks, and benefits of total knee arthroplasty were presented and reviewed. The risks including but not limited to aseptic loosening, infection, blood clots, vascular injury, stiffness, patella tracking problems complications among others were discussed. The patient acknowledged the explanation, agreed to proceed with the plan.  Preoperative templating  of the joint replacement has been completed, documented, and submitted to the Operating Room personnel in order to optimize intra-operative equipment management.    Patient's anticipated LOS is less than 2 midnights, meeting these requirements: - Lives within 1 hour of care - Has a competent adult at home to recover with post-op recover - NO history of  - Chronic pain requiring opiods  - Diabetes  - Coronary Artery Disease  - Heart failure  - Heart attack  - Stroke  - DVT/VTE  - Cardiac arrhythmia  - Respiratory Failure/COPD  - Renal failure  - Anemia  - Advanced Liver disease       Donia Ast 08/14/2018, 9:08 AM

## 2018-08-14 NOTE — Anesthesia Procedure Notes (Signed)
Procedure Name: MAC Date/Time: 08/14/2018 11:48 AM Performed by: Cynda Familia, CRNA Pre-anesthesia Checklist: Patient identified, Emergency Drugs available, Suction available and Patient being monitored Patient Re-evaluated:Patient Re-evaluated prior to induction Oxygen Delivery Method: Simple face mask Placement Confirmation: positive ETCO2 and breath sounds checked- equal and bilateral Dental Injury: Teeth and Oropharynx as per pre-operative assessment  Comments: Sedation for spinal

## 2018-08-14 NOTE — Progress Notes (Signed)
Assisted Dr. Woodrum with right, ultrasound guided, adductor canal block. Side rails up, monitors on throughout procedure. See vital signs in flow sheet. Tolerated Procedure well.  

## 2018-08-14 NOTE — Evaluation (Signed)
Physical Therapy Evaluation Patient Details Name: Ronald Dunn MRN: [id number] DOB: 1932-07-07 Today's Date: 08/14/2018   History of Present Illness  85 yo male s/p R TKR on 08/14/18. PMH includes CAD s/p CABG 2018, CKD, melanoma, PVCs, PAF, HLD, HTN, DVT, incomplete RBBB.  Clinical Impression   Pt presents with R knee pain, decreased R knee ROM, increased time and effort to perform mobility tasks, and decreased activity tolerance due to pain. Pt to benefit from acute PT to address deficits. Pt ambulated hallway distance with RW with min guard assist, verbal cuing provided throughout. Pt educated on ankle pumps (20/hour) to perform this afternoon/evening to increase circulation, to pt's tolerance and limited by pain. PT to progress mobility as tolerated, and will continue to follow acutely.        Follow Up Recommendations Follow surgeon's recommendation for DC plan and follow-up therapies;Supervision for mobility/OOB    Equipment Recommendations  None recommended by PT    Recommendations for Other Services       Precautions / Restrictions Precautions Precautions: Fall Restrictions Weight Bearing Restrictions: No Other Position/Activity Restrictions: WBAT       Mobility  Bed Mobility Overal bed mobility: Needs Assistance Bed Mobility: Supine to Sit     Supine to sit: Min guard;HOB elevated     General bed mobility comments: Min guard for safety, increased time and effort   Transfers Overall transfer level: Needs assistance Equipment used: Rolling walker (2 wheeled) Transfers: Sit to/from Stand Sit to Stand: Min guard;From elevated surface         General transfer comment: Min guard for safety. Verbal cuing for hand placement.   Ambulation/Gait Ambulation/Gait assistance: Min guard Gait Distance (Feet): 40 Feet Assistive device: Rolling walker (2 wheeled) Gait Pattern/deviations: Step-to pattern;Decreased stance time - right;Decreased weight shift to  right;Trunk flexed Gait velocity: decr    General Gait Details: Min guard for safety. Verbal cuing for placement in RW, turning, sequencing with step-to gait.   Stairs            Wheelchair Mobility    Modified Rankin (Stroke Patients Only)       Balance Overall balance assessment: Mild deficits observed, not formally tested                                           Pertinent Vitals/Pain Pain Assessment: 0-10 Pain Score: 4  Pain Location: R knee  Pain Descriptors / Indicators: Sore Pain Intervention(s): Limited activity within patient's tolerance;Repositioned;Ice applied;Monitored during session;Premedicated before session    Home Living Family/patient expects to be discharged to:: Private residence Living Arrangements: Spouse/significant other Available Help at Discharge: Family Type of Home: House Home Access: Stairs to enter Entrance Stairs-Rails: None Entrance Stairs-Number of Steps: 1 Home Layout: Two level;Able to live on main level with bedroom/bathroom Home Equipment: Walker - 2 wheels;Hand held shower head;Shower seat - built in      Prior Function Level of Independence: Independent               Hand Dominance   Dominant Hand: Right    Extremity/Trunk Assessment   Upper Extremity Assessment Upper Extremity Assessment: Overall WFL for tasks assessed    Lower Extremity Assessment Lower Extremity Assessment: Overall WFL for tasks assessed;RLE deficits/detail RLE Deficits / Details: suspected post-surgical weakness; able to perform ankle pumps, quad set, SLR without quad lag or assist, heel slide  to 90* RLE Sensation: decreased light touch;WNL(tingling in toes ) LLE Sensation: WNL    Cervical / Trunk Assessment Cervical / Trunk Assessment: Normal  Communication   Communication: No difficulties  Cognition Arousal/Alertness: Awake/alert Behavior During Therapy: WFL for tasks assessed/performed Overall Cognitive Status:  Within Functional Limits for tasks assessed                                        General Comments      Exercises Total Joint Exercises Goniometric ROM: knee aarom ~5-90*, limited by pain    Assessment/Plan    PT Assessment Patient needs continued PT services  PT Problem List Decreased strength;Pain;Decreased range of motion;Decreased activity tolerance;Decreased knowledge of use of DME;Decreased mobility       PT Treatment Interventions DME instruction;Therapeutic activities;Gait training;Therapeutic exercise;Patient/family education;Stair training;Balance training;Functional mobility training    PT Goals (Current goals can be found in the Care Plan section)  Acute Rehab PT Goals Patient Stated Goal: none stated  PT Goal Formulation: With patient Time For Goal Achievement: 08/21/18 Potential to Achieve Goals: Good    Frequency 7X/week   Barriers to discharge        Co-evaluation               AM-PAC PT "6 Clicks" Mobility  Outcome Measure Help needed turning from your back to your side while in a flat bed without using bedrails?: A Little Help needed moving from lying on your back to sitting on the side of a flat bed without using bedrails?: A Little Help needed moving to and from a bed to a chair (including a wheelchair)?: A Little Help needed standing up from a chair using your arms (e.g., wheelchair or bedside chair)?: A Little Help needed to walk in hospital room?: A Little Help needed climbing 3-5 steps with a railing? : A Little 6 Click Score: 18    End of Session Equipment Utilized During Treatment: Gait belt Activity Tolerance: Patient tolerated treatment well Patient left: in chair;with chair alarm set;with call bell/phone within reach;with SCD's reapplied Nurse Communication: Mobility status PT Visit Diagnosis: Other abnormalities of gait and mobility (R26.89);Difficulty in walking, not elsewhere classified (R26.2)    Time:  9357-0177 PT Time Calculation (min) (ACUTE ONLY): 21 min   Charges:   PT Evaluation $PT Eval Low Complexity: 1 Low        Davy Faught Conception Chancy, PT Acute Rehabilitation Services Pager [phone]  Office [phone]   Saddie Sandeen D Elonda Husky 08/14/2018, 8:52 PM

## 2018-08-14 NOTE — Anesthesia Procedure Notes (Signed)
Anesthesia Regional Block: Adductor canal block   Pre-Anesthetic Checklist: ,, timeout performed, Correct Patient, Correct Site, Correct Laterality, Correct Procedure, Correct Position, site marked, Risks and benefits discussed,  Surgical consent,  Pre-op evaluation,  At surgeon's request and post-op pain management  Laterality: Right  Prep: Maximum Sterile Barrier Precautions used, chloraprep       Needles:  Injection technique: Single-shot  Needle Type: Echogenic Stimulator Needle     Needle Length: 9cm  Needle Gauge: 22     Additional Needles:   Procedures:,,,, ultrasound used (permanent image in chart),,,,  Narrative:  Start time: 08/14/2018 11:33 AM End time: 08/14/2018 11:43 AM Injection made incrementally with aspirations every 5 mL.  Performed by: Personally  Anesthesiologist: Freddrick March, MD  Additional Notes: Monitors applied. No increased pain on injection. No increased resistance to injection. Injection made in 5cc increments. Good needle visualization. Patient tolerated procedure well.

## 2018-08-14 NOTE — Anesthesia Postprocedure Evaluation (Signed)
Anesthesia Post Note  Patient: Ronald Dunn  Procedure(s) Performed: TOTAL KNEE ARTHROPLASTY (Right Knee)     Patient location during evaluation: PACU Anesthesia Type: Regional and Spinal Level of consciousness: oriented and awake and alert Pain management: pain level controlled Vital Signs Assessment: post-procedure vital signs reviewed and stable Respiratory status: spontaneous breathing, respiratory function stable and patient connected to nasal cannula oxygen Cardiovascular status: blood pressure returned to baseline and stable Postop Assessment: no headache, no backache and no apparent nausea or vomiting Anesthetic complications: no    Last Vitals:  Vitals:   08/14/18 1729 08/14/18 1827  BP: 111/70 111/68  Pulse: 77 79  Resp: 16 16  Temp: (!) 36.4 C (!) 36.3 C  SpO2: 100% 100%    Last Pain:  Vitals:   08/14/18 1836  TempSrc:   PainSc: 0-No pain                 Shandel Busic L Zyra Parrillo

## 2018-08-14 NOTE — Transfer of Care (Signed)
Immediate Anesthesia Transfer of Care Note  Patient: Ronald Dunn  Procedure(s) Performed: TOTAL KNEE ARTHROPLASTY (Right Knee)  Patient Location: PACU  Anesthesia Type:Spinal  Level of Consciousness: awake and alert   Airway & Oxygen Therapy: Patient Spontanous Breathing and Patient connected to face mask oxygen  Post-op Assessment: Report given to RN and Post -op Vital signs reviewed and stable  Post vital signs: Reviewed and stable  Last Vitals:  Vitals Value Taken Time  BP 109/62 08/14/2018  1:50 PM  Temp    Pulse 65 08/14/2018  1:51 PM  Resp 11 08/14/2018  1:51 PM  SpO2 99 % 08/14/2018  1:51 PM  Vitals shown include unvalidated device data.  Last Pain:  Vitals:   08/14/18 0918  TempSrc:   PainSc: 0-No pain         Complications: required Neo intraop - has been off and B/P stable in PACU

## 2018-08-14 NOTE — Anesthesia Procedure Notes (Signed)
Spinal  Patient location during procedure: OR Start time: 08/14/2018 12:00 PM End time: 08/14/2018 12:17 PM Staffing Anesthesiologist: Freddrick March, MD Performed: anesthesiologist  Preanesthetic Checklist Completed: patient identified, surgical consent, pre-op evaluation, timeout performed, IV checked, risks and benefits discussed and monitors and equipment checked Spinal Block Patient position: sitting Prep: site prepped and draped and DuraPrep Patient monitoring: cardiac monitor, continuous pulse ox and blood pressure Approach: left paramedian Location: L3-4 Injection technique: single-shot Needle Needle type: Quincke  Needle gauge: 22 G Needle length: 9 cm Assessment Sensory level: T6 Additional Notes Functioning IV was confirmed and monitors were applied. Sterile prep and drape, including hand hygiene and sterile gloves were used. The patient was positioned and the spine was prepped. The skin was anesthetized with lidocaine. One attempt made midline at L4-5 with 24G Pencan. Second attempt mildine at L3-4 with 22G Quincke. Third attempt left paramedian at L3-L4 with 22G Quincke. Free flow of clear CSF was obtained prior to injecting local anesthetic into the CSF.  The spinal needle aspirated freely following injection.  The needle was carefully withdrawn.  The patient tolerated the procedure well.

## 2018-08-15 ENCOUNTER — Encounter (HOSPITAL_COMMUNITY): Payer: Self-pay | Admitting: Orthopedic Surgery

## 2018-08-15 DIAGNOSIS — I2510 Atherosclerotic heart disease of native coronary artery without angina pectoris: Secondary | ICD-10-CM | POA: Diagnosis not present

## 2018-08-15 DIAGNOSIS — M25761 Osteophyte, right knee: Secondary | ICD-10-CM | POA: Diagnosis not present

## 2018-08-15 DIAGNOSIS — I129 Hypertensive chronic kidney disease with stage 1 through stage 4 chronic kidney disease, or unspecified chronic kidney disease: Secondary | ICD-10-CM | POA: Diagnosis not present

## 2018-08-15 DIAGNOSIS — Z951 Presence of aortocoronary bypass graft: Secondary | ICD-10-CM | POA: Diagnosis not present

## 2018-08-15 DIAGNOSIS — M1711 Unilateral primary osteoarthritis, right knee: Secondary | ICD-10-CM | POA: Diagnosis not present

## 2018-08-15 DIAGNOSIS — N189 Chronic kidney disease, unspecified: Secondary | ICD-10-CM | POA: Diagnosis not present

## 2018-08-15 LAB — BASIC METABOLIC PANEL
ANION GAP: 6 (ref 5–15)
BUN: 23 mg/dL (ref 8–23)
CO2: 22 mmol/L (ref 22–32)
Calcium: 8.7 mg/dL — ABNORMAL LOW (ref 8.9–10.3)
Chloride: 106 mmol/L (ref 98–111)
Creatinine, Ser: 1.24 mg/dL (ref 0.61–1.24)
GFR calc non Af Amer: 53 mL/min — ABNORMAL LOW (ref >60)
Glucose, Bld: 123 mg/dL — ABNORMAL HIGH (ref 70–99)
Potassium: 4.7 mmol/L (ref 3.5–5.1)
Sodium: 134 mmol/L — ABNORMAL LOW (ref 135–145)

## 2018-08-15 LAB — CBC
HCT: 35.1 % — ABNORMAL LOW (ref 39.0–52.0)
Hemoglobin: 11.4 g/dL — ABNORMAL LOW (ref 13.0–17.0)
MCH: 31.1 pg (ref 26.0–34.0)
MCHC: 32.5 g/dL (ref 30.0–36.0)
MCV: 95.9 fL (ref 80.0–100.0)
NRBC: 0 % (ref 0.0–0.2)
Platelets: 153 10*3/uL (ref 150–400)
RBC: 3.66 MIL/uL — ABNORMAL LOW (ref 4.22–5.81)
RDW: 14 % (ref 11.5–15.5)
WBC: 10.6 10*3/uL — ABNORMAL HIGH (ref 4.0–10.5)

## 2018-08-15 MED ORDER — METHOCARBAMOL 500 MG PO TABS: 500.0000 mg | ORAL_TABLET | Freq: Four times a day (QID) | ORAL | 0 refills | Status: DC | PRN

## 2018-08-15 MED ORDER — OXYCODONE HCL 5 MG PO TABS: 5.0000 mg | ORAL_TABLET | Freq: Four times a day (QID) | ORAL | 0 refills | Status: DC | PRN

## 2018-08-15 MED ORDER — ASPIRIN 325 MG PO TBEC: 325.0000 mg | DELAYED_RELEASE_TABLET | Freq: Two times a day (BID) | ORAL | 0 refills | Status: DC

## 2018-08-15 NOTE — Progress Notes (Signed)
Physical Therapy Treatment Patient Details Name: Ronald OGBORN MRN: [id number] DOB: [birth year] Today's Date: 08/15/2018    History of Present Illness 85 yo male s/p R TKR on 08/14/18. PMH includes CAD s/p CABG 2018, CKD, melanoma, PVCs, PAF, HLD, HTN, DVT, incomplete RBBB.    PT Comments    Pt progressing well; will see for second session to review stairs. Pt in agreement, feels ready fo rd/c today, wife present for PT session  Follow Up Recommendations  Follow surgeon's recommendation for DC plan and follow-up therapies;Supervision for mobility/OOB     Equipment Recommendations  None recommended by PT    Recommendations for Other Services       Precautions / Restrictions Precautions Precautions: Fall Restrictions Weight Bearing Restrictions: No Other Position/Activity Restrictions: WBAT     Mobility  Bed Mobility Overal bed mobility: Needs Assistance Bed Mobility: Supine to Sit     Supine to sit: Supervision     General bed mobility comments: for safety  Transfers Overall transfer level: Needs assistance Equipment used: Rolling walker (2 wheeled) Transfers: Sit to/from Stand Sit to Stand: Supervision         General transfer comment: cues for hand placement  Ambulation/Gait Ambulation/Gait assistance: Supervision Gait Distance (Feet): 100 Feet Assistive device: Rolling walker (2 wheeled) Gait Pattern/deviations: Step-to pattern;Decreased stance time - right;Decreased weight shift to right;Trunk flexed     General Gait Details: cues for sequence and safety with turns   Stairs             Wheelchair Mobility    Modified Rankin (Stroke Patients Only)       Balance                                            Cognition Arousal/Alertness: Awake/alert Behavior During Therapy: WFL for tasks assessed/performed Overall Cognitive Status: Within Functional Limits for tasks assessed                                        Exercises Total Joint Exercises Ankle Circles/Pumps: AROM;Both;15 reps Quad Sets: AROM;Both;10 reps Heel Slides: AAROM;AROM;Right;10 reps Hip ABduction/ADduction: AROM;Right;10 reps Straight Leg Raises: AROM;Right;10 reps Long Arc Quad: AROM;Right;10 reps Goniometric ROM: ~ 2* to 90* AROM right knee flexion    General Comments        Pertinent Vitals/Pain Pain Assessment: 0-10 Pain Score: 1  Pain Location: R knee  Pain Descriptors / Indicators: Sore Pain Intervention(s): Limited activity within patient's tolerance;Monitored during session;Repositioned    Home Living                      Prior Function            PT Goals (current goals can now be found in the care plan section) Acute Rehab PT Goals Patient Stated Goal: none stated  PT Goal Formulation: With patient Time For Goal Achievement: 08/21/18 Potential to Achieve Goals: Good Progress towards PT goals: Progressing toward goals    Frequency    7X/week      PT Plan Current plan remains appropriate    Co-evaluation              AM-PAC PT "6 Clicks" Mobility   Outcome Measure  Help needed turning from your back to your side  while in a flat bed without using bedrails?: A Little Help needed moving from lying on your back to sitting on the side of a flat bed without using bedrails?: A Little Help needed moving to and from a bed to a chair (including a wheelchair)?: A Little Help needed standing up from a chair using your arms (e.g., wheelchair or bedside chair)?: A Little Help needed to walk in hospital room?: A Little Help needed climbing 3-5 steps with a railing? : A Little 6 Click Score: 18    End of Session Equipment Utilized During Treatment: Gait belt Activity Tolerance: Patient tolerated treatment well Patient left: in chair;with call bell/phone within reach;with chair alarm set;with family/visitor present   PT Visit Diagnosis: Other abnormalities of gait and mobility  (R26.89);Difficulty in walking, not elsewhere classified (R26.2)     Time: 5248-1859 PT Time Calculation (min) (ACUTE ONLY): 18 min  Charges:  $Gait Training: 8-22 mins                     Kenyon Ana, PT  Pager: [phone] Acute Rehab Dept [facility]): [phone]   08/15/2018    [facility] 08/15/2018, 11:55 AM

## 2018-08-15 NOTE — Op Note (Signed)
TOTAL KNEE REPLACEMENT OPERATIVE NOTE:  08/14/2018  2:56 PM  PATIENT:  Ronald Dunn  85 y.o. male  PRE-OPERATIVE DIAGNOSIS:  Primary osteoarthritis right knee  POST-OPERATIVE DIAGNOSIS:  Primary osteoarthritis right knee  PROCEDURE:  Procedure(s): TOTAL KNEE ARTHROPLASTY  SURGEON:  Surgeon(s): Vickey Huger, MD  PHYSICIAN ASSISTANT: Carlyon Shadow, PA-C   ANESTHESIA:   spinal  SPECIMEN: None  COUNTS:  Correct  TOURNIQUET:   Total Tourniquet Time Documented: Thigh (Right) - -24 minutes Total: Thigh (Right) - -24 minutes   DICTATION:  Indication for procedure:    The patient is a 85 y.o. male who has failed conservative treatment for Primary osteoarthritis right knee.  Informed consent was obtained prior to anesthesia. The risks versus benefits of the operation were explain and in a way the patient can, and did, understand.   On the implant demand matching protocol, this patient scored 8.  Therefore, this patient was not receive a polyethylene insert with vitamin E which is a high demand implant.  Description of procedure:     The patient was taken to the operating room and placed under anesthesia.  The patient was positioned in the usual fashion taking care that all body parts were adequately padded and/or protected.  A tourniquet was applied and the leg prepped and draped in the usual sterile fashion.  The extremity was exsanguinated with the esmarch and tourniquet inflated to 350 mmHg.  Pre-operative range of motion was normal.  The knee was in 5 degree of mild varus.  A midline incision approximately 6-7 inches long was made with a #10 blade.  A new blade was used to make a parapatellar arthrotomy going 2-3 cm into the quadriceps tendon, over the patella, and alongside the medial aspect of the patellar tendon.  A synovectomy was then performed with the #10 blade and forceps. I then elevated the deep MCL off the medial tibial metaphysis subperiosteally around to the  semimembranosus attachment.    I everted the patella and used calipers to measure patellar thickness.  I used the reamer to ream down to appropriate thickness to recreate the native thickness.  I then removed excess bone with the rongeur and sagittal saw.  I used the appropriately sized template and drilled the three lug holes.  I then put the trial in place and measured the thickness with the calipers to ensure recreation of the native thickness.  The trial was then removed and the patella subluxed and the knee brought into flexion.  A homan retractor was place to retract and protect the patella and lateral structures.  A Z-retractor was place medially to protect the medial structures.  The extra-medullary alignment system was used to make cut the tibial articular surface perpendicular to the anamotic axis of the tibia and in 3 degrees of posterior slope.  The cut surface and alignment jig was removed.  I then used the intramedullary alignment guide to make a 6 valgus cut on the distal femur.  I then marked out the epicondylar axis on the distal femur.  The posterior condylar axis measured 3 degrees.  I then used the anterior referencing sizer and measured the femur to be a size 10.  The 4-In-1 cutting block was screwed into place in external rotation matching the posterior condylar angle, making our cuts perpendicular to the epicondylar axis.  Anterior, posterior and chamfer cuts were made with the sagittal saw.  The cutting block and cut pieces were removed.  A lamina spreader was placed in 90  degrees of flexion.  The ACL, PCL, menisci, and posterior condylar osteophytes were removed.  A 10 mm spacer blocked was found to offer good flexion and extension gap balance after moderate in degree releasing.   The scoop retractor was then placed and the femoral finishing block was pinned in place.  The small sagittal saw was used as well as the lug drill to finish the femur.  The block and cut surfaces were  removed and the medullary canal hole filled with autograft bone from the cut pieces.  The tibia was delivered forward in deep flexion and external rotation.  A size G tray was selected and pinned into place centered on the medial 1/3 of the tibial tubercle.  The reamer and keel was used to prepare the tibia through the tray.    I then trialed with the size 10 femur, size G tibia, a 66mm insert and the 38 patella.  I had excellent flexion/extension gap balance, excellent patella tracking.  Flexion was full and beyond 120 degrees; extension was zero.  These components were chosen and the staff opened them to me on the back table while the knee was lavaged copiously and the cement mixed.  The soft tissue was infiltrated with 60cc of exparel 1.3% through a 21 gauge needle.  I cemented in the components and removed all excess cement.  The polyethylene tibial component was snapped into place and the knee placed in extension while cement was hardening.  The capsule was infilltrated with a 60cc exparel/marcaine/saline mixture.   Once the cement was hard, the tourniquet was let down.  Hemostasis was obtained.  The arthrotomy was closed using a #1 stratofix running suture.  The deep soft tissues were closed with #0 vicryls and the subcuticular layer closed with #2-0 vicryl.  The skin was reapproximated and closed with 3.0 Monocryl.  The wound was covered with steristrips, aquacel dressing, and a TED stocking.   The patient was then awakened, extubated, and taken to the recovery room in stable condition.  BLOOD LOSS:  295JO COMPLICATIONS:  None.  PLAN OF CARE: Admit for overnight observation  PATIENT DISPOSITION:  PACU - hemodynamically stable.   Delay start of Pharmacological VTE agent (>24hrs) due to surgical blood loss or risk of bleeding:  not applicable  Please fax a copy of this op note to my office at [phone] (please only include page 1 and 2 of the Case Information op  note)

## 2018-08-15 NOTE — Care Management Note (Signed)
Case Management Note  Patient Details  Name: Ronald Dunn MRN: [id number] Date of Birth: [birth year]  Subjective/Objective:     Discharge planning, spoke with patient and spouse at bedside. Have chosen [facility] for [facility] PT, evaluate and treat.              Action/Plan: Contacted [facility] for referral. They have accepted. Has DME.Marland Kitchen [phone]    Expected Discharge Date:  08/15/18               Expected Discharge Plan:  [place]  In-House Referral:  NA  Discharge planning Services  CM Consult  Post Acute Care Choice:  Home Health Choice offered to:  Patient  DME Arranged:  N/A DME Agency:  NA  HH Arranged:  PT [place] Agency:  [facility] (formerly [employer])  Status of Service:  Completed, signed off  If discussed at [employer], dates discussed:    Additional Comments:  Guadalupe Maple, RN 08/15/2018, 9:37 AM

## 2018-08-15 NOTE — Care Management Obs Status (Signed)
[place] NOTIFICATION   Patient Details  Name: RENEE BEALE MRN: [id number] Date of Birth: April 02, 1932   Medicare Observation Status Notification Given:  Yes    Guadalupe Maple, RN 08/15/2018, 2:11 PM

## 2018-08-15 NOTE — Progress Notes (Signed)
   08/15/18 1300  PT Visit Information  Last PT Received On 08/15/18---pt continues to progress well, performed stairs x2. Pt feels ready to d/c   Assistance Needed +1  History of Present Illness 85 yo male s/p R TKR on 08/14/18. PMH includes CAD s/p CABG 2018, CKD, melanoma, PVCs, PAF, HLD, HTN, DVT, incomplete RBBB.  Subjective Data  Patient Stated Goal none stated   Precautions  Precautions Fall;Knee  Restrictions  Weight Bearing Restrictions No  Other Position/Activity Restrictions WBAT   Pain Assessment  Pain Assessment 0-10  Pain Score 5  Pain Location R knee   Pain Descriptors / Indicators Sore  Pain Intervention(s) Limited activity within patient's tolerance;Monitored during session  Cognition  Arousal/Alertness Awake/alert  Behavior During Therapy WFL for tasks assessed/performed  Overall Cognitive Status Within Functional Limits for tasks assessed  Bed Mobility  General bed mobility comments in chair  Transfers  Overall transfer level Needs assistance  Equipment used Rolling walker (2 wheeled)  Transfers Sit to/from Stand  Sit to Stand Supervision  General transfer comment cues for hand placement  Ambulation/Gait  Ambulation/Gait assistance Supervision  Gait Distance (Feet) 25 Feet  Assistive device Rolling walker (2 wheeled)  Gait Pattern/deviations Step-to pattern;Decreased stance time - right;Decreased weight shift to right;Trunk flexed  General Gait Details cues for sequence  Gait velocity decr   Stairs Yes  Stairs assistance Min assist;Min guard  Stair Management No rails;Step to pattern;Forwards;With walker  Number of Stairs 1 (x2)  General stair comments cues for sequence and safety  PT - End of Session  Equipment Utilized During Treatment Gait belt  Activity Tolerance Patient tolerated treatment well  Patient left in chair;with call bell/phone within reach;with chair alarm set;with family/visitor present  Nurse Communication Mobility status   PT -  Assessment/Plan  PT Plan Current plan remains appropriate  PT Visit Diagnosis Other abnormalities of gait and mobility (R26.89);Difficulty in walking, not elsewhere classified (R26.2)  PT Frequency (ACUTE ONLY) 7X/week  Follow Up Recommendations Follow surgeon's recommendation for DC plan and follow-up therapies;Supervision for mobility/OOB  PT equipment None recommended by PT  AM-PAC PT "6 Clicks" Mobility Outcome Measure (Version 2)  Help needed turning from your back to your side while in a flat bed without using bedrails? 3  Help needed moving from lying on your back to sitting on the side of a flat bed without using bedrails? 3  Help needed moving to and from a bed to a chair (including a wheelchair)? 3  Help needed standing up from a chair using your arms (e.g., wheelchair or bedside chair)? 3  Help needed to walk in hospital room? 3  Help needed climbing 3-5 steps with a railing?  3  6 Click Score 18  Consider Recommendation of Discharge To: Home with [facility]  PT Goal Progression  Progress towards PT goals Progressing toward goals  Acute Rehab PT Goals  PT Goal Formulation With patient  Time For Goal Achievement 08/21/18  Potential to Achieve Goals Good  PT Time Calculation  PT Start Time (ACUTE ONLY) 1210  PT Stop Time (ACUTE ONLY) 1225  PT Time Calculation (min) (ACUTE ONLY) 15 min  PT General Charges  $$ ACUTE PT VISIT 1 Visit  PT Treatments  $Gait Training 8-22 mins

## 2018-08-15 NOTE — Plan of Care (Signed)
  Problem: Clinical Measurements: Goal: Will remain free from infection Outcome: Progressing Goal: Respiratory complications will improve Outcome: Progressing   Problem: Activity: Goal: Risk for activity intolerance will decrease Outcome: Progressing   Problem: Pain Managment: Goal: General experience of comfort will improve Outcome: Progressing   Problem: Safety: Goal: Ability to remain free from injury will improve Outcome: Progressing   

## 2018-08-15 NOTE — Progress Notes (Signed)
SPORTS MEDICINE AND JOINT REPLACEMENT  Lara Mulch, MD    Carlyon Shadow, PA-C [place], [place], [place]  [address]                             [phone]   PROGRESS NOTE  Subjective:  negative for Chest Pain  negative for Shortness of Breath  negative for Nausea/Vomiting   negative for Calf Pain  negative for Bowel Movement   Tolerating Diet: yes         Patient reports pain as 3 on 0-10 scale.    Objective: Vital signs in last 24 hours:    Patient Vitals for the past 24 hrs:  BP Temp Temp src Pulse Resp SpO2 Height Weight  08/15/18 0641 117/61 (!) 97.4 F (36.3 C) Oral 66 - 99 % - -  08/15/18 0246 106/69 97.9 F (36.6 C) Oral 67 15 99 % - -  08/14/18 2250 107/63 97.7 F (36.5 C) Oral 73 16 100 % - -  08/14/18 2114 129/69 (!) 97.5 F (36.4 C) Oral 84 16 98 % - -  08/14/18 2006 118/75 97.7 F (36.5 C) Oral 83 16 97 % - -  08/14/18 1827 111/68 (!) 97.4 F (36.3 C) Oral 79 16 100 % - -  08/14/18 1729 111/70 (!) 97.5 F (36.4 C) Oral 77 16 100 % - -  08/14/18 1634 102/70 (!) 97.5 F (36.4 C) Oral 77 16 100 % - -  08/14/18 1634 102/70 (!) 97.5 F (36.4 C) Oral 77 16 100 % 5\' 10"  (1.778 m) 74 kg  08/14/18 1615 109/63 (!) 97.5 F (36.4 C) - 76 15 98 % - -  08/14/18 1600 100/60 - - 74 13 99 % - -  08/14/18 1545 105/60 - - 73 12 98 % - -  08/14/18 1530 101/63 - - 74 12 97 % - -  08/14/18 1515 98/60 - - 80 16 98 % - -  08/14/18 1500 (!) 96/58 - - 70 12 97 % - -  08/14/18 1445 (!) 87/62 - - 73 19 96 % - -  08/14/18 1430 95/60 - - 66 11 95 % - -  08/14/18 1415 (!) 85/54 - - 69 15 96 % - -  08/14/18 1400 (!) 103/57 - - 70 10 98 % - -  08/14/18 1350 109/62 98.1 F (36.7 C) - 65 19 99 % - -  08/14/18 1145 128/69 - - 72 15 100 % - -  08/14/18 1144 - - - 71 15 100 % - -  08/14/18 1143 - - - 66 17 100 % - -  08/14/18 1142 - - - 70 16 100 % - -  08/14/18 1141 - - - 75 (!) 22 100 % - -  08/14/18 1140 - - - 75 17 100 % - -  08/14/18 1139 - - - 72 17 99 % - -   08/14/18 1138 - - - 72 (!) 22 100 % - -  08/14/18 1137 - - - 69 17 100 % - -  08/14/18 1136 - - - 71 18 100 % - -  08/14/18 1135 - - - 70 18 100 % - -  08/14/18 1134 - - - 72 (!) 23 100 % - -  08/14/18 1133 - - - 70 18 100 % - -  08/14/18 1132 - - - 67 15 100 % - -  08/14/18 1131 - - -  69 (!) 26 100 % - -  08/14/18 1130 124/68 - - 68 20 100 % - -  08/14/18 1129 - - - 71 18 100 % - -  08/14/18 1128 127/71 - - 67 (!) 7 100 % - -  08/14/18 0918 - - - - - - 5\' 10"  (1.778 m) 74.4 kg  08/14/18 0908 137/74 98.1 F (36.7 C) Oral 74 18 100 % - -    @flow {1959:LAST@   Intake/Output from previous day:   01/20 0701 - 01/21 0700 In: 2832.8 [P.O.:540; I.V.:2092.8] Out: 3532 [Urine:3275]   Intake/Output this shift:   01/20 1901 - 01/21 0700 In: 889.9 [P.O.:300; I.V.:489.9] Out: 1750 [Urine:1750]   Intake/Output      01/20 0701 - 01/21 0700   P.O. 540   I.V. (mL/kg) 2092.8 (28.3)   IV Piggyback 200   Total Intake(mL/kg) 2832.8 (38.3)   Urine (mL/kg/hr) 3275   Blood 100   Total Output 3375   Net -542.2          LABORATORY DATA: Recent Labs    08/08/18 1429 08/15/18 0442  WBC 7.9 10.6*  HGB 13.0 11.4*  HCT 40.4 35.1*  PLT 194 153   Recent Labs    08/08/18 1429 08/15/18 0442  NA 139 134*  K 4.5 4.7  CL 105 106  CO2 27 22  BUN 22 23  CREATININE 1.61* 1.24  GLUCOSE 93 123*  CALCIUM 9.6 8.7*   No results found for: INR, PROTIME  Examination:  General appearance: alert, cooperative and no distress Extremities: extremities normal, atraumatic, no cyanosis or edema  Wound Exam: clean, dry, intact   Drainage:  None: wound tissue dry  Motor Exam: Quadriceps and Hamstrings Intact  Sensory Exam: Superficial Peroneal, Deep Peroneal and Tibial normal   Assessment:    1 Day Post-Op  Procedure(s) (LRB): TOTAL KNEE ARTHROPLASTY (Right)  ADDITIONAL DIAGNOSIS:  Active Problems:   S/P total knee replacement     Plan: Physical Therapy as ordered Weight Bearing as  Tolerated (WBAT)  DVT Prophylaxis:  Aspirin  DISCHARGE PLAN: Home  DISCHARGE NEEDS: HHPT       Patient's anticipated LOS is less than 2 midnights, meeting these requirements: - Lives within 1 hour of care - Has a competent adult at home to recover with post-op recover - NO history of  - Chronic pain requiring opiods  - Diabetes  - Coronary Artery Disease  - Heart failure  - Heart attack  - Stroke  - DVT/VTE  - Cardiac arrhythmia  - Respiratory Failure/COPD  - Renal failure  - Anemia  - Advanced Liver disease        Donia Ast 08/15/2018, 6:59 AM

## 2018-08-15 NOTE — Discharge Summary (Signed)
SPORTS MEDICINE & JOINT REPLACEMENT   Lara Mulch, MD   Carlyon Shadow, PA-C Ronald [address], [place], [place]  [address]                             [phone]  PATIENT ID: Ronald Dunn        MRN:  [id number]          DOB/AGE: July 12, 1932 / 85 y.o.    DISCHARGE SUMMARY  ADMISSION DATE:    08/14/2018 DISCHARGE DATE:   08/15/2018   ADMISSION DIAGNOSIS: Primary osteoarthritis right knee    DISCHARGE DIAGNOSIS:  Primary osteoarthritis right knee    ADDITIONAL DIAGNOSIS: Active Problems:   S/P total knee replacement  Past Medical History:  Diagnosis Date  . Abnormal stress echocardiogram 08/16/2015   Overview:  With post exercise LV dilation  . Arthritis 04/14/2017  . Borderline anemia 05/2018   no medication prescribed at this time  . Chronic ischemic heart disease   . CKD (chronic kidney disease) 04/14/2017  . Coronary artery disease involving native coronary artery of native heart 04/14/2017  . Coronary artery disease with stable angina pectoris ([facility]) 09/01/2015   pt denies chest pain  . DVT (deep venous thrombosis) ([facility])    Right leg  . Dyspnea 2017  . Essential hypertension 07/22/2015  . Gout 04/14/2017  . Hyperlipidemia 07/22/2015  . Incomplete right bundle branch block (RBBB)    Noted on EKG 01/31/2018  . 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 Leukocytosis 04/14/2017  . Nocturia   . Obesity 04/14/2017  . On amiodarone therapy 09/01/2015  . PAF (paroxysmal atrial fibrillation) ([facility]) 09/01/2015   pt unaware  . Pleural effusion 09/22/2015   Overview:  Left, post CABG with US guided thoracentesis  . Premature ventricular contractions (PVCs) ([facility]) 04/14/2017  . S/P CABG (coronary artery bypass graft) 04/14/2017  . Skin cancer (melanoma) ([facility]) 04/14/2017  . Urinary frequency   . Urinary urgency   . Venous insufficiency   . Vitamin B 12 deficiency     PROCEDURE: Procedure(s): TOTAL KNEE ARTHROPLASTY on 08/14/2018  CONSULTS:    HISTORY:  See H&P in chart  HOSPITAL  COURSE:  Ronald Dunn is a 85 y.o. admitted on 08/14/2018 and found to have a diagnosis of Primary osteoarthritis right knee.  After appropriate laboratory studies were obtained  they were taken to the operating room on 08/14/2018 and underwent Procedure(s): TOTAL KNEE ARTHROPLASTY.   They were given perioperative antibiotics:  Anti-infectives (From admission, onward)   Start     Dose/Rate Route Frequency Ordered Stop   08/14/18 1800  ceFAZolin (ANCEF) IVPB 2g/100 mL premix     2 g 200 mL/hr over 30 Minutes Intravenous Every 6 hours 08/14/18 1643 08/15/18 0057   08/14/18 0900  ceFAZolin (ANCEF) IVPB 2g/100 mL premix     2 g 200 mL/hr over 30 Minutes Intravenous On call to O.R. 08/14/18 8099 08/14/18 1218    .  Patient given tranexamic acid IV or topical and exparel intra-operatively.  Tolerated the procedure well.    POD# 1: Vital signs were stable.  Patient denied Chest pain, shortness of breath, or calf pain.  Patient was started on Aspirin twice daily at 8am.  Consults to PT, OT, and care management were made.  The patient was weight bearing as tolerated.  CPM was placed on the operative leg 0-90 degrees for 6-8 hours a day. When out of the CPM, patient  was placed in the foam block to achieve full extension. Incentive spirometry was taught.  Dressing was changed.       POD #2, Continued  PT for ambulation and exercise program.  IV saline locked.  O2 discontinued.    The remainder of the hospital course was dedicated to ambulation and strengthening.   The patient was discharged on 1 Day Post-Op in  Good condition.  Blood products given:none  DIAGNOSTIC STUDIES: Recent vital signs:  Patient Vitals for the past 24 hrs:  BP Temp Temp src Pulse Resp SpO2 Height Weight  08/15/18 0641 117/61 (!) 97.4 F (36.3 C) Oral 66 - 99 % - -  08/15/18 0246 106/69 97.9 F (36.6 C) Oral 67 15 99 % - -  08/14/18 2250 107/63 97.7 F (36.5 C) Oral 73 16 100 % - -  08/14/18 2114 129/69 (!) 97.5 F  (36.4 C) Oral 84 16 98 % - -  08/14/18 2006 118/75 97.7 F (36.5 C) Oral 83 16 97 % - -  08/14/18 1827 111/68 (!) 97.4 F (36.3 C) Oral 79 16 100 % - -  08/14/18 1729 111/70 (!) 97.5 F (36.4 C) Oral 77 16 100 % - -  08/14/18 1634 102/70 (!) 97.5 F (36.4 C) Oral 77 16 100 % - -  08/14/18 1634 102/70 (!) 97.5 F (36.4 C) Oral 77 16 100 % 5\' 10"  (1.778 m) 74 kg  08/14/18 1615 109/63 (!) 97.5 F (36.4 C) - 76 15 98 % - -  08/14/18 1600 100/60 - - 74 13 99 % - -  08/14/18 1545 105/60 - - 73 12 98 % - -  08/14/18 1530 101/63 - - 74 12 97 % - -  08/14/18 1515 98/60 - - 80 16 98 % - -  08/14/18 1500 (!) 96/58 - - 70 12 97 % - -  08/14/18 1445 (!) 87/62 - - 73 19 96 % - -  08/14/18 1430 95/60 - - 66 11 95 % - -  08/14/18 1415 (!) 85/54 - - 69 15 96 % - -  08/14/18 1400 (!) 103/57 - - 70 10 98 % - -  08/14/18 1350 109/62 98.1 F (36.7 C) - 65 19 99 % - -  08/14/18 1145 128/69 - - 72 15 100 % - -  08/14/18 1144 - - - 71 15 100 % - -  08/14/18 1143 - - - 66 17 100 % - -  08/14/18 1142 - - - 70 16 100 % - -  08/14/18 1141 - - - 75 (!) 22 100 % - -  08/14/18 1140 - - - 75 17 100 % - -  08/14/18 1139 - - - 72 17 99 % - -  08/14/18 1138 - - - 72 (!) 22 100 % - -  08/14/18 1137 - - - 69 17 100 % - -  08/14/18 1136 - - - 71 18 100 % - -  08/14/18 1135 - - - 70 18 100 % - -  08/14/18 1134 - - - 72 (!) 23 100 % - -  08/14/18 1133 - - - 70 18 100 % - -  08/14/18 1132 - - - 67 15 100 % - -  08/14/18 1131 - - - 69 (!) 26 100 % - -  08/14/18 1130 124/68 - - 68 20 100 % - -  08/14/18 1129 - - - 71 18 100 % - -  08/14/18 1128 127/71 - -  67 (!) 7 100 % - -  08/14/18 0918 - - - - - - 5\' 10"  (1.778 m) 74.4 kg  08/14/18 0908 137/74 98.1 F (36.7 C) Oral 74 18 100 % - -       Recent laboratory studies: Recent Labs    08/08/18 1429 08/15/18 0442  WBC 7.9 10.6*  HGB 13.0 11.4*  HCT 40.4 35.1*  PLT 194 153   Recent Labs    08/08/18 1429 08/15/18 0442  NA 139 134*  K 4.5 4.7  CL 105  106  CO2 27 22  BUN 22 23  CREATININE 1.61* 1.24  GLUCOSE 93 123*  CALCIUM 9.6 8.7*   No results found for: INR, PROTIME   Recent Radiographic Studies :  No results found.  DISCHARGE INSTRUCTIONS: Discharge Instructions    Call MD / Call 911   Complete by:  As directed    If you experience chest pain or shortness of breath, CALL 911 and be transported to the hospital emergency room.  If you develope a fever above 101 F, pus (white drainage) or increased drainage or redness at the wound, or calf pain, call your surgeon's office.   Constipation Prevention   Complete by:  As directed    Drink plenty of fluids.  Prune juice may be helpful.  You may use a stool softener, such as Colace (over the counter) 100 mg twice a day.  Use MiraLax (over the counter) for constipation as needed.   Diet - low sodium heart healthy   Complete by:  As directed    Discharge instructions   Complete by:  As directed    INSTRUCTIONS AFTER JOINT REPLACEMENT   Remove items at home which could result in a fall. This includes throw rugs or furniture in walking pathways ICE to the affected joint every three hours while awake for 30 minutes at a time, for at least the first 3-5 days, and then as needed for pain and swelling.  Continue to use ice for pain and swelling. You may notice swelling that will progress down to the foot and ankle.  This is normal after surgery.  Elevate your leg when you are not up walking on it.   Continue to use the breathing machine you got in the hospital (incentive spirometer) which will help keep your temperature down.  It is common for your temperature to cycle up and down following surgery, especially at night when you are not up moving around and exerting yourself.  The breathing machine keeps your lungs expanded and your temperature down.   DIET:  As you were doing prior to hospitalization, we recommend a well-balanced diet.  DRESSING / WOUND CARE / SHOWERING  Keep the surgical  dressing until follow up.  The dressing is water proof, so you can shower without any extra covering.  IF THE DRESSING FALLS OFF or the wound gets wet inside, change the dressing with sterile gauze.  Please use good hand washing techniques before changing the dressing.  Do not use any lotions or creams on the incision until instructed by your surgeon.    ACTIVITY  Increase activity slowly as tolerated, but follow the weight bearing instructions below.   No driving for 6 weeks or until further direction given by your physician.  You cannot drive while taking narcotics.  No lifting or carrying greater than 10 lbs. until further directed by your surgeon. Avoid periods of inactivity such as sitting longer than an hour when not asleep.  This helps prevent blood clots.  You may return to work once you are authorized by your doctor.     WEIGHT BEARING   Weight bearing as tolerated with assist device (walker, cane, etc) as directed, use it as long as suggested by your surgeon or therapist, typically at least 4-6 weeks.   EXERCISES  Results after joint replacement surgery are often greatly improved when you follow the exercise, range of motion and muscle strengthening exercises prescribed by your doctor. Safety measures are also important to protect the joint from further injury. Any time any of these exercises cause you to have increased pain or swelling, decrease what you are doing until you are comfortable again and then slowly increase them. If you have problems or questions, call your caregiver or physical therapist for advice.   Rehabilitation is important following a joint replacement. After just a few days of immobilization, the muscles of the leg can become weakened and shrink (atrophy).  These exercises are designed to build up the tone and strength of the thigh and leg muscles and to improve motion. Often times heat used for twenty to thirty minutes before working out will loosen up your  tissues and help with improving the range of motion but do not use heat for the first two weeks following surgery (sometimes heat can increase post-operative swelling).   These exercises can be done on a training (exercise) mat, on the floor, on a table or on a bed. Use whatever works the best and is most comfortable for you.    Use music or television while you are exercising so that the exercises are a pleasant break in your day. This will make your life better with the exercises acting as a break in your routine that you can look forward to.   Perform all exercises about fifteen times, three times per day or as directed.  You should exercise both the operative leg and the other leg as well.   Exercises include:   Quad Sets - Tighten up the muscle on the front of the thigh (Quad) and hold for 5-10 seconds.   Straight Leg Raises - With your knee straight (if you were given a brace, keep it on), lift the leg to 60 degrees, hold for 3 seconds, and slowly lower the leg.  Perform this exercise against resistance later as your leg gets stronger.  Leg Slides: Lying on your back, slowly slide your foot toward your buttocks, bending your knee up off the floor (only go as far as is comfortable). Then slowly slide your foot back down until your leg is flat on the floor again.  Angel Wings: Lying on your back spread your legs to the side as far apart as you can without causing discomfort.  Hamstring Strength:  Lying on your back, push your heel against the floor with your leg straight by tightening up the muscles of your buttocks.  Repeat, but this time bend your knee to a comfortable angle, and push your heel against the floor.  You may put a pillow under the heel to make it more comfortable if necessary.   A rehabilitation program following joint replacement surgery can speed recovery and prevent re-injury in the future due to weakened muscles. Contact your doctor or a physical therapist for more information on  knee rehabilitation.    CONSTIPATION  Constipation is defined medically as fewer than three stools per week and severe constipation as less than one stool per week.  Even if  you have a regular bowel pattern at home, your normal regimen is likely to be disrupted due to multiple reasons following surgery.  Combination of anesthesia, postoperative narcotics, change in appetite and fluid intake all can affect your bowels.   YOU MUST use at least one of the following options; they are listed in order of increasing strength to get the job done.  They are all available over the counter, and you may need to use some, POSSIBLY even all of these options:    Drink plenty of fluids (prune juice may be helpful) and high fiber foods Colace 100 mg by mouth twice a day  Senokot for constipation as directed and as needed Dulcolax (bisacodyl), take with full glass of water  Miralax (polyethylene glycol) once or twice a day as needed.  If you have tried all these things and are unable to have a bowel movement in the first 3-4 days after surgery call either your surgeon or your primary doctor.    If you experience loose stools or diarrhea, hold the medications until you stool forms back up.  If your symptoms do not get better within 1 week or if they get worse, check with your doctor.  If you experience "the worst abdominal pain ever" or develop nausea or vomiting, please contact the office immediately for further recommendations for treatment.   ITCHING:  If you experience itching with your medications, try taking only a single pain pill, or even half a pain pill at a time.  You can also use Benadryl over the counter for itching or also to help with sleep.   TED HOSE STOCKINGS:  Use stockings on both legs until for at least 2 weeks or as directed by physician office. They may be removed at night for sleeping.  MEDICATIONS:  See your medication summary on the "After Visit Summary" that nursing will review with  you.  You may have some home medications which will be placed on hold until you complete the course of blood thinner medication.  It is important for you to complete the blood thinner medication as prescribed.  PRECAUTIONS:  If you experience chest pain or shortness of breath - call 911 immediately for transfer to the hospital emergency department.   If you develop a fever greater that 101 F, purulent drainage from wound, increased redness or drainage from wound, foul odor from the wound/dressing, or calf pain - CONTACT YOUR SURGEON.                                                   FOLLOW-UP APPOINTMENTS:  If you do not already have a post-op appointment, please call the office for an appointment to be seen by your surgeon.  Guidelines for how soon to be seen are listed in your "After Visit Summary", but are typically between 1-4 weeks after surgery.  OTHER INSTRUCTIONS:   Knee Replacement:  Do not place pillow under knee, focus on keeping the knee straight while resting. CPM instructions: 0-90 degrees, 2 hours in the morning, 2 hours in the afternoon, and 2 hours in the evening. Place foam block, curve side up under heel at all times except when in CPM or when walking.  DO NOT modify, tear, cut, or change the foam block in any way.  MAKE SURE YOU:  Understand these instructions.  Get help  right away if you are not doing well or get worse.    Thank you for letting us be a part of your medical care team.  It is a privilege we respect greatly.  We hope these instructions will help you stay on track for a fast and full recovery!   Increase activity slowly as tolerated   Complete by:  As directed       DISCHARGE MEDICATIONS:   Allergies as of 08/15/2018   No Known Allergies     Medication List    TAKE these medications   allopurinol 100 MG tablet Commonly known as:  ZYLOPRIM Take 100 mg by mouth 2 (two) times daily.   aspirin 325 MG EC tablet Take 1 tablet (325 mg total) by mouth 2  (two) times daily. What changed:    medication strength  how much to take  when to take this   ezetimibe 10 MG tablet Commonly known as:  ZETIA Take 1 tablet (10 mg total) by mouth daily.   GLUCOSAMINE-CHONDROITIN PO Take 1 tablet by mouth 2 (two) times daily.   loteprednol 0.5 % ophthalmic suspension Commonly known as:  LOTEMAX Place 1 drop into both eyes every morning.   methocarbamol 500 MG tablet Commonly known as:  ROBAXIN Take 1-2 tablets (500-1,000 mg total) by mouth every 6 (six) hours as needed for muscle spasms.   multivitamin capsule Take 1 capsule by mouth daily.   oxyCODONE 5 MG immediate release tablet Commonly known as:  Oxy IR/ROXICODONE Take 1-2 tablets (5-10 mg total) by mouth every 6 (six) hours as needed for moderate pain (pain score 4-6).   spironolactone 25 MG tablet Commonly known as:  ALDACTONE Take 1 tablet (25 mg total) by mouth daily.   sulfamethoxazole-trimethoprim 800-160 MG tablet Commonly known as:  BACTRIM DS,SEPTRA DS Take 1 tablet by mouth 2 (two) times daily.            Durable Medical Equipment  (From admission, onward)         Start     Ordered   08/14/18 1644  DME Walker rolling  Once    Question:  Patient needs a walker to treat with the following condition  Answer:  S/P total knee replacement   08/14/18 1643   08/14/18 1644  DME 3 n 1  Once     08/14/18 1643   08/14/18 1644  DME Bedside commode  Once    Question:  Patient needs a bedside commode to treat with the following condition  Answer:  S/P total knee replacement   08/14/18 1643          FOLLOW UP VISIT:    DISPOSITION: HOME VS. SNF  CONDITION:  Good   Donia Ast 08/15/2018, 7:03 AM

## 2018-08-16 DIAGNOSIS — I480 Paroxysmal atrial fibrillation: Secondary | ICD-10-CM | POA: Diagnosis not present

## 2018-08-16 DIAGNOSIS — Z85828 Personal history of other malignant neoplasm of skin: Secondary | ICD-10-CM | POA: Diagnosis not present

## 2018-08-16 DIAGNOSIS — Z471 Aftercare following joint replacement surgery: Secondary | ICD-10-CM | POA: Diagnosis not present

## 2018-08-16 DIAGNOSIS — I129 Hypertensive chronic kidney disease with stage 1 through stage 4 chronic kidney disease, or unspecified chronic kidney disease: Secondary | ICD-10-CM | POA: Diagnosis not present

## 2018-08-16 DIAGNOSIS — N189 Chronic kidney disease, unspecified: Secondary | ICD-10-CM | POA: Diagnosis not present

## 2018-08-16 DIAGNOSIS — I2510 Atherosclerotic heart disease of native coronary artery without angina pectoris: Secondary | ICD-10-CM | POA: Diagnosis not present

## 2018-08-16 DIAGNOSIS — E669 Obesity, unspecified: Secondary | ICD-10-CM | POA: Diagnosis not present

## 2018-08-16 DIAGNOSIS — M109 Gout, unspecified: Secondary | ICD-10-CM | POA: Diagnosis not present

## 2018-08-16 DIAGNOSIS — Z86718 Personal history of other venous thrombosis and embolism: Secondary | ICD-10-CM | POA: Diagnosis not present

## 2018-08-16 DIAGNOSIS — Z96651 Presence of right artificial knee joint: Secondary | ICD-10-CM | POA: Diagnosis not present

## 2018-08-16 DIAGNOSIS — I872 Venous insufficiency (chronic) (peripheral): Secondary | ICD-10-CM | POA: Diagnosis not present

## 2018-08-16 DIAGNOSIS — Z6824 Body mass index (BMI) 24.0-24.9, adult: Secondary | ICD-10-CM | POA: Diagnosis not present

## 2018-08-16 DIAGNOSIS — E785 Hyperlipidemia, unspecified: Secondary | ICD-10-CM | POA: Diagnosis not present

## 2018-08-17 DIAGNOSIS — N189 Chronic kidney disease, unspecified: Secondary | ICD-10-CM | POA: Diagnosis not present

## 2018-08-17 DIAGNOSIS — I129 Hypertensive chronic kidney disease with stage 1 through stage 4 chronic kidney disease, or unspecified chronic kidney disease: Secondary | ICD-10-CM | POA: Diagnosis not present

## 2018-08-17 DIAGNOSIS — I480 Paroxysmal atrial fibrillation: Secondary | ICD-10-CM | POA: Diagnosis not present

## 2018-08-17 DIAGNOSIS — Z96651 Presence of right artificial knee joint: Secondary | ICD-10-CM | POA: Diagnosis not present

## 2018-08-17 DIAGNOSIS — I2510 Atherosclerotic heart disease of native coronary artery without angina pectoris: Secondary | ICD-10-CM | POA: Diagnosis not present

## 2018-08-17 DIAGNOSIS — Z471 Aftercare following joint replacement surgery: Secondary | ICD-10-CM | POA: Diagnosis not present

## 2018-08-18 DIAGNOSIS — Z471 Aftercare following joint replacement surgery: Secondary | ICD-10-CM | POA: Diagnosis not present

## 2018-08-18 DIAGNOSIS — Z96651 Presence of right artificial knee joint: Secondary | ICD-10-CM | POA: Diagnosis not present

## 2018-08-18 DIAGNOSIS — I480 Paroxysmal atrial fibrillation: Secondary | ICD-10-CM | POA: Diagnosis not present

## 2018-08-18 DIAGNOSIS — I129 Hypertensive chronic kidney disease with stage 1 through stage 4 chronic kidney disease, or unspecified chronic kidney disease: Secondary | ICD-10-CM | POA: Diagnosis not present

## 2018-08-18 DIAGNOSIS — I2510 Atherosclerotic heart disease of native coronary artery without angina pectoris: Secondary | ICD-10-CM | POA: Diagnosis not present

## 2018-08-18 DIAGNOSIS — N189 Chronic kidney disease, unspecified: Secondary | ICD-10-CM | POA: Diagnosis not present

## 2018-08-21 DIAGNOSIS — N189 Chronic kidney disease, unspecified: Secondary | ICD-10-CM | POA: Diagnosis not present

## 2018-08-21 DIAGNOSIS — I129 Hypertensive chronic kidney disease with stage 1 through stage 4 chronic kidney disease, or unspecified chronic kidney disease: Secondary | ICD-10-CM | POA: Diagnosis not present

## 2018-08-21 DIAGNOSIS — Z471 Aftercare following joint replacement surgery: Secondary | ICD-10-CM | POA: Diagnosis not present

## 2018-08-21 DIAGNOSIS — I2510 Atherosclerotic heart disease of native coronary artery without angina pectoris: Secondary | ICD-10-CM | POA: Diagnosis not present

## 2018-08-21 DIAGNOSIS — I480 Paroxysmal atrial fibrillation: Secondary | ICD-10-CM | POA: Diagnosis not present

## 2018-08-21 DIAGNOSIS — Z96651 Presence of right artificial knee joint: Secondary | ICD-10-CM | POA: Diagnosis not present

## 2018-08-23 DIAGNOSIS — Z471 Aftercare following joint replacement surgery: Secondary | ICD-10-CM | POA: Diagnosis not present

## 2018-08-23 DIAGNOSIS — I2510 Atherosclerotic heart disease of native coronary artery without angina pectoris: Secondary | ICD-10-CM | POA: Diagnosis not present

## 2018-08-23 DIAGNOSIS — N189 Chronic kidney disease, unspecified: Secondary | ICD-10-CM | POA: Diagnosis not present

## 2018-08-23 DIAGNOSIS — Z96651 Presence of right artificial knee joint: Secondary | ICD-10-CM | POA: Diagnosis not present

## 2018-08-23 DIAGNOSIS — I129 Hypertensive chronic kidney disease with stage 1 through stage 4 chronic kidney disease, or unspecified chronic kidney disease: Secondary | ICD-10-CM | POA: Diagnosis not present

## 2018-08-23 DIAGNOSIS — I480 Paroxysmal atrial fibrillation: Secondary | ICD-10-CM | POA: Diagnosis not present

## 2018-08-24 DIAGNOSIS — Z96651 Presence of right artificial knee joint: Secondary | ICD-10-CM | POA: Diagnosis not present

## 2018-08-24 DIAGNOSIS — M25461 Effusion, right knee: Secondary | ICD-10-CM | POA: Diagnosis not present

## 2018-08-25 DIAGNOSIS — R2689 Other abnormalities of gait and mobility: Secondary | ICD-10-CM | POA: Diagnosis not present

## 2018-08-25 DIAGNOSIS — M62551 Muscle wasting and atrophy, not elsewhere classified, right thigh: Secondary | ICD-10-CM | POA: Diagnosis not present

## 2018-08-25 DIAGNOSIS — M25461 Effusion, right knee: Secondary | ICD-10-CM | POA: Diagnosis not present

## 2018-08-25 DIAGNOSIS — M25561 Pain in right knee: Secondary | ICD-10-CM | POA: Diagnosis not present

## 2018-08-25 DIAGNOSIS — Z96651 Presence of right artificial knee joint: Secondary | ICD-10-CM | POA: Diagnosis not present

## 2018-08-28 DIAGNOSIS — M25461 Effusion, right knee: Secondary | ICD-10-CM | POA: Diagnosis not present

## 2018-08-28 DIAGNOSIS — Z96651 Presence of right artificial knee joint: Secondary | ICD-10-CM | POA: Diagnosis not present

## 2018-08-28 DIAGNOSIS — M25561 Pain in right knee: Secondary | ICD-10-CM | POA: Diagnosis not present

## 2018-08-28 DIAGNOSIS — R2689 Other abnormalities of gait and mobility: Secondary | ICD-10-CM | POA: Diagnosis not present

## 2018-08-28 DIAGNOSIS — M62551 Muscle wasting and atrophy, not elsewhere classified, right thigh: Secondary | ICD-10-CM | POA: Diagnosis not present

## 2018-08-30 DIAGNOSIS — M25561 Pain in right knee: Secondary | ICD-10-CM | POA: Diagnosis not present

## 2018-08-30 DIAGNOSIS — M25461 Effusion, right knee: Secondary | ICD-10-CM | POA: Diagnosis not present

## 2018-08-30 DIAGNOSIS — M62551 Muscle wasting and atrophy, not elsewhere classified, right thigh: Secondary | ICD-10-CM | POA: Diagnosis not present

## 2018-08-30 DIAGNOSIS — Z96651 Presence of right artificial knee joint: Secondary | ICD-10-CM | POA: Diagnosis not present

## 2018-08-30 DIAGNOSIS — R2689 Other abnormalities of gait and mobility: Secondary | ICD-10-CM | POA: Diagnosis not present

## 2018-09-01 DIAGNOSIS — D510 Vitamin B12 deficiency anemia due to intrinsic factor deficiency: Secondary | ICD-10-CM | POA: Diagnosis not present

## 2018-09-01 DIAGNOSIS — Z96651 Presence of right artificial knee joint: Secondary | ICD-10-CM | POA: Diagnosis not present

## 2018-09-01 DIAGNOSIS — M25561 Pain in right knee: Secondary | ICD-10-CM | POA: Diagnosis not present

## 2018-09-01 DIAGNOSIS — M25461 Effusion, right knee: Secondary | ICD-10-CM | POA: Diagnosis not present

## 2018-09-01 DIAGNOSIS — R2689 Other abnormalities of gait and mobility: Secondary | ICD-10-CM | POA: Diagnosis not present

## 2018-09-01 DIAGNOSIS — M62551 Muscle wasting and atrophy, not elsewhere classified, right thigh: Secondary | ICD-10-CM | POA: Diagnosis not present

## 2018-09-04 DIAGNOSIS — M25461 Effusion, right knee: Secondary | ICD-10-CM | POA: Diagnosis not present

## 2018-09-04 DIAGNOSIS — M25561 Pain in right knee: Secondary | ICD-10-CM | POA: Diagnosis not present

## 2018-09-04 DIAGNOSIS — Z96651 Presence of right artificial knee joint: Secondary | ICD-10-CM | POA: Diagnosis not present

## 2018-09-04 DIAGNOSIS — R2689 Other abnormalities of gait and mobility: Secondary | ICD-10-CM | POA: Diagnosis not present

## 2018-09-04 DIAGNOSIS — M62551 Muscle wasting and atrophy, not elsewhere classified, right thigh: Secondary | ICD-10-CM | POA: Diagnosis not present

## 2018-09-06 DIAGNOSIS — M62551 Muscle wasting and atrophy, not elsewhere classified, right thigh: Secondary | ICD-10-CM | POA: Diagnosis not present

## 2018-09-06 DIAGNOSIS — R2689 Other abnormalities of gait and mobility: Secondary | ICD-10-CM | POA: Diagnosis not present

## 2018-09-06 DIAGNOSIS — M25561 Pain in right knee: Secondary | ICD-10-CM | POA: Diagnosis not present

## 2018-09-06 DIAGNOSIS — M25461 Effusion, right knee: Secondary | ICD-10-CM | POA: Diagnosis not present

## 2018-09-06 DIAGNOSIS — Z96651 Presence of right artificial knee joint: Secondary | ICD-10-CM | POA: Diagnosis not present

## 2018-09-07 DIAGNOSIS — I10 Essential (primary) hypertension: Secondary | ICD-10-CM | POA: Diagnosis not present

## 2018-09-07 DIAGNOSIS — Z1331 Encounter for screening for depression: Secondary | ICD-10-CM | POA: Diagnosis not present

## 2018-09-07 DIAGNOSIS — R358 Other polyuria: Secondary | ICD-10-CM | POA: Diagnosis not present

## 2018-09-07 DIAGNOSIS — Z6825 Body mass index (BMI) 25.0-25.9, adult: Secondary | ICD-10-CM | POA: Diagnosis not present

## 2018-09-07 DIAGNOSIS — M25461 Effusion, right knee: Secondary | ICD-10-CM | POA: Diagnosis not present

## 2018-09-08 DIAGNOSIS — Z96651 Presence of right artificial knee joint: Secondary | ICD-10-CM | POA: Diagnosis not present

## 2018-09-08 DIAGNOSIS — R2689 Other abnormalities of gait and mobility: Secondary | ICD-10-CM | POA: Diagnosis not present

## 2018-09-08 DIAGNOSIS — M25561 Pain in right knee: Secondary | ICD-10-CM | POA: Diagnosis not present

## 2018-09-08 DIAGNOSIS — M25461 Effusion, right knee: Secondary | ICD-10-CM | POA: Diagnosis not present

## 2018-09-08 DIAGNOSIS — M62551 Muscle wasting and atrophy, not elsewhere classified, right thigh: Secondary | ICD-10-CM | POA: Diagnosis not present

## 2018-09-11 DIAGNOSIS — Z96651 Presence of right artificial knee joint: Secondary | ICD-10-CM | POA: Diagnosis not present

## 2018-09-11 DIAGNOSIS — R2689 Other abnormalities of gait and mobility: Secondary | ICD-10-CM | POA: Diagnosis not present

## 2018-09-11 DIAGNOSIS — M62551 Muscle wasting and atrophy, not elsewhere classified, right thigh: Secondary | ICD-10-CM | POA: Diagnosis not present

## 2018-09-11 DIAGNOSIS — M25461 Effusion, right knee: Secondary | ICD-10-CM | POA: Diagnosis not present

## 2018-09-11 DIAGNOSIS — M25561 Pain in right knee: Secondary | ICD-10-CM | POA: Diagnosis not present

## 2018-09-13 DIAGNOSIS — Z96651 Presence of right artificial knee joint: Secondary | ICD-10-CM | POA: Diagnosis not present

## 2018-09-13 DIAGNOSIS — R2689 Other abnormalities of gait and mobility: Secondary | ICD-10-CM | POA: Diagnosis not present

## 2018-09-13 DIAGNOSIS — M25461 Effusion, right knee: Secondary | ICD-10-CM | POA: Diagnosis not present

## 2018-09-13 DIAGNOSIS — M25561 Pain in right knee: Secondary | ICD-10-CM | POA: Diagnosis not present

## 2018-09-13 DIAGNOSIS — M62551 Muscle wasting and atrophy, not elsewhere classified, right thigh: Secondary | ICD-10-CM | POA: Diagnosis not present

## 2018-09-15 DIAGNOSIS — R2689 Other abnormalities of gait and mobility: Secondary | ICD-10-CM | POA: Diagnosis not present

## 2018-09-15 DIAGNOSIS — M62551 Muscle wasting and atrophy, not elsewhere classified, right thigh: Secondary | ICD-10-CM | POA: Diagnosis not present

## 2018-09-15 DIAGNOSIS — M25561 Pain in right knee: Secondary | ICD-10-CM | POA: Diagnosis not present

## 2018-09-15 DIAGNOSIS — M25461 Effusion, right knee: Secondary | ICD-10-CM | POA: Diagnosis not present

## 2018-09-15 DIAGNOSIS — Z96651 Presence of right artificial knee joint: Secondary | ICD-10-CM | POA: Diagnosis not present

## 2018-09-18 DIAGNOSIS — Z96651 Presence of right artificial knee joint: Secondary | ICD-10-CM | POA: Diagnosis not present

## 2018-09-18 DIAGNOSIS — M25561 Pain in right knee: Secondary | ICD-10-CM | POA: Diagnosis not present

## 2018-09-18 DIAGNOSIS — M25461 Effusion, right knee: Secondary | ICD-10-CM | POA: Diagnosis not present

## 2018-09-18 DIAGNOSIS — M62551 Muscle wasting and atrophy, not elsewhere classified, right thigh: Secondary | ICD-10-CM | POA: Diagnosis not present

## 2018-09-18 DIAGNOSIS — R2689 Other abnormalities of gait and mobility: Secondary | ICD-10-CM | POA: Diagnosis not present

## 2018-09-20 DIAGNOSIS — M25561 Pain in right knee: Secondary | ICD-10-CM | POA: Diagnosis not present

## 2018-09-20 DIAGNOSIS — R2689 Other abnormalities of gait and mobility: Secondary | ICD-10-CM | POA: Diagnosis not present

## 2018-09-20 DIAGNOSIS — M62551 Muscle wasting and atrophy, not elsewhere classified, right thigh: Secondary | ICD-10-CM | POA: Diagnosis not present

## 2018-09-20 DIAGNOSIS — M25461 Effusion, right knee: Secondary | ICD-10-CM | POA: Diagnosis not present

## 2018-09-20 DIAGNOSIS — Z96651 Presence of right artificial knee joint: Secondary | ICD-10-CM | POA: Diagnosis not present

## 2018-09-22 DIAGNOSIS — Z96651 Presence of right artificial knee joint: Secondary | ICD-10-CM | POA: Diagnosis not present

## 2018-09-22 DIAGNOSIS — M25461 Effusion, right knee: Secondary | ICD-10-CM | POA: Diagnosis not present

## 2018-09-22 DIAGNOSIS — R2689 Other abnormalities of gait and mobility: Secondary | ICD-10-CM | POA: Diagnosis not present

## 2018-09-22 DIAGNOSIS — M62551 Muscle wasting and atrophy, not elsewhere classified, right thigh: Secondary | ICD-10-CM | POA: Diagnosis not present

## 2018-09-22 DIAGNOSIS — M25561 Pain in right knee: Secondary | ICD-10-CM | POA: Diagnosis not present

## 2018-09-25 ENCOUNTER — Other Ambulatory Visit: Payer: Self-pay | Admitting: Cardiology

## 2018-09-25 DIAGNOSIS — M62551 Muscle wasting and atrophy, not elsewhere classified, right thigh: Secondary | ICD-10-CM | POA: Diagnosis not present

## 2018-09-25 DIAGNOSIS — M25461 Effusion, right knee: Secondary | ICD-10-CM | POA: Diagnosis not present

## 2018-09-25 DIAGNOSIS — M25561 Pain in right knee: Secondary | ICD-10-CM | POA: Diagnosis not present

## 2018-09-25 DIAGNOSIS — Z96651 Presence of right artificial knee joint: Secondary | ICD-10-CM | POA: Diagnosis not present

## 2018-09-25 DIAGNOSIS — R2689 Other abnormalities of gait and mobility: Secondary | ICD-10-CM | POA: Diagnosis not present

## 2018-09-26 DIAGNOSIS — D519 Vitamin B12 deficiency anemia, unspecified: Secondary | ICD-10-CM | POA: Diagnosis not present

## 2018-09-26 DIAGNOSIS — E538 Deficiency of other specified B group vitamins: Secondary | ICD-10-CM | POA: Diagnosis not present

## 2018-09-26 DIAGNOSIS — D649 Anemia, unspecified: Secondary | ICD-10-CM | POA: Diagnosis not present

## 2018-09-26 NOTE — Progress Notes (Signed)
Cardiology Office Note:    Date:  09/27/2018   ID:  Trilby Drummer, DOB 1932/04/12, MRN [id number]  PCP:  Angelina Sheriff, MD  Cardiologist:  Shirlee More, MD    Referring MD: Angelina Sheriff, MD    ASSESSMENT:    1. Swelling of lower extremity   2. Essential hypertension   3. Lower extremity edema   4. Coronary artery disease of native artery of native heart with stable angina pectoris ([place])   5. Hyperlipidemia, unspecified hyperlipidemia type    PLAN:    In order of problems listed above:  1. Since elective total knee arthroplasty January is developed bilateral lower extremity edema at risk for deep vein thrombosis will undergo duplex lower extremities.  I have given him a prescription for furosemide 20 mg that he can take for improvement in his peripheral edema as tenseness is limiting mobility and rehab from his knee injury. 2. Stable blood pressure target continue current treatment with spironolactone. 3. Stable CAD continue medical therapy aspirin and lipid-lowering therapy with Zetia statin intolerant 4. Continue current treatment Zetia   Next appointment: 3 months   Medication Adjustments/Labs and Tests Ordered: Current medicines are reviewed at length with the patient today.  Concerns regarding medicines are outlined above.  Orders Placed This Encounter  Procedures  . Basic Metabolic Panel (BMET)  . Pro b natriuretic peptide (BNP)   Meds ordered this encounter  Medications  . furosemide (LASIX) 20 MG tablet    Sig: Take 1 tablet (20 mg) daily for 3 days, then take 1-2 weekly as needed for swelling.    Dispense:  30 tablet    Refill:  1  . spironolactone (ALDACTONE) 25 MG tablet    Sig: Take 1 tablet (25 mg total) by mouth daily.    Dispense:  90 tablet    Refill:  0    Chief Complaint  Patient presents with  . Follow-up  . Coronary Artery Disease  . Hypertension    History of Present Illness:    HOBIE KOHLES is a 85 y.o. male with a hx of  CAD, Dyslipidemia, HTN, S/P CABG 08/26/15 last seen 01/23/18. Compliance with diet, lifestyle and medications: Yes  Since surgeries developed bilateral lower extremity edema tense on the right where a right total knee arthroplasty relates has a history of previous deep vein thrombosis but does not feel the same he has not been anticoagulated.  For further evaluation of a duplex of his lower extremities performed along with a BMP and proBNP level.  He is given a prescription for furosemide for relief of edema has had no orthopnea chest pain palpitation or syncope Past Medical History:  Diagnosis Date  . Abnormal stress echocardiogram 08/16/2015   Overview:  With post exercise LV dilation  . Arthritis 04/14/2017  . Borderline anemia 05/2018   no medication prescribed at this time  . Chronic ischemic heart disease   . CKD (chronic kidney disease) 04/14/2017  . Coronary artery disease involving native coronary artery of native heart 04/14/2017  . Coronary artery disease with stable angina pectoris ([place]) 09/01/2015   pt denies chest pain  . DVT (deep venous thrombosis) ([facility])    Right leg  . Dyspnea 2017  . Essential hypertension 07/22/2015  . Gout 04/14/2017  . Hyperlipidemia 07/22/2015  . Incomplete right bundle branch block (RBBB)    Noted on EKG 01/31/2018  . 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 Leukocytosis 04/14/2017  . Nocturia   .  Obesity 04/14/2017  . On amiodarone therapy 09/01/2015  . PAF (paroxysmal atrial fibrillation) ([place]) 09/01/2015   pt unaware  . Pleural effusion 09/22/2015   Overview:  Left, post CABG with US guided thoracentesis  . Premature ventricular contractions (PVCs) (VPCs) 04/14/2017  . S/P CABG (coronary artery bypass graft) 04/14/2017  . Skin cancer (melanoma) ([place]) 04/14/2017  . Urinary frequency   . Urinary urgency   . Venous insufficiency   . Vitamin B 12 deficiency     Past Surgical History:  Procedure Laterality Date  . CARDIAC CATHETERIZATION    . COLONOSCOPY     . CORNEAL TRANSPLANT Bilateral   . CORONARY ARTERY BYPASS GRAFT  08/26/2015  . FOOT SURGERY Right   . TOTAL KNEE ARTHROPLASTY Right 08/14/2018   Procedure: TOTAL KNEE ARTHROPLASTY;  Surgeon: Vickey Huger, MD;  Location: [facility];  Service: Orthopedics;  Laterality: Right;    Current Medications: Current Meds  Medication Sig  . allopurinol (ZYLOPRIM) 100 MG tablet Take 100 mg by mouth 2 (two) times daily.   Marland Kitchen aspirin EC 81 MG tablet Take 81 mg by mouth daily.  Marland Kitchen ezetimibe (ZETIA) 10 MG tablet TAKE 1 TABLET BY MOUTH ONCE DAILY  . loteprednol (LOTEMAX) 0.5 % ophthalmic suspension Place 1 drop into both eyes every morning.   . methocarbamol (ROBAXIN) 500 MG tablet Take 1-2 tablets (500-1,000 mg total) by mouth every 6 (six) hours as needed for muscle spasms.  . Multiple Vitamin (MULTIVITAMIN) capsule Take 1 capsule by mouth daily.   Marland Kitchen oxyCODONE (OXY IR/ROXICODONE) 5 MG immediate release tablet Take 1-2 tablets (5-10 mg total) by mouth every 6 (six) hours as needed for moderate pain (pain score 4-6).  Marland Kitchen spironolactone (ALDACTONE) 25 MG tablet Take 1 tablet (25 mg total) by mouth daily.  . [DISCONTINUED] spironolactone (ALDACTONE) 25 MG tablet Take 1 tablet (25 mg total) by mouth daily.     Allergies:   Patient has no known allergies.   Social History   Socioeconomic History  . Marital status: Married    Spouse name: Not on file  . Number of children: Not on file  . Years of education: Not on file  . Highest education level: Not on file  Occupational History  . Not on file  Social Needs  . Financial resource strain: Not on file  . Food insecurity:    Worry: Not on file    Inability: Not on file  . Transportation needs:    Medical: Not on file    Non-medical: Not on file  Tobacco Use  . Smoking status: Never Smoker  . Smokeless tobacco: Never Used  Substance and Sexual Activity  . Alcohol use: Yes    Alcohol/week: 1.0 standard drinks    Types: 1 Glasses of wine per week  .  Drug use: No  . Sexual activity: Not on file  Lifestyle  . Physical activity:    Days per week: Not on file    Minutes per session: Not on file  . Stress: Not on file  Relationships  . Social connections:    Talks on phone: Not on file    Gets together: Not on file    Attends religious service: Not on file    Active member of club or organization: Not on file    Attends meetings of clubs or organizations: Not on file    Relationship status: Not on file  Other Topics Concern  . Not on file  Social History Narrative  . Not  on file     Family History: The patient's family history includes Heart disease in his father; Heart failure in his father. ROS:   Please see the history of present illness.    All other systems reviewed and are negative.  EKGs/Labs/Other Studies Reviewed:    The following studies were reviewed today:   Recent Labs: 08/08/2018: ALT 19 08/15/2018: BUN 23; Creatinine, Ser 1.24; Hemoglobin 11.4; Platelets 153; Potassium 4.7; Sodium 134  Recent Lipid Panel    Component Value Date/Time   CHOL 176 05/22/2018 0831   TRIG 93 05/22/2018 0831   HDL 64 05/22/2018 0831   CHOLHDL 2.8 05/22/2018 0831   LDLCALC 93 05/22/2018 0831    Physical Exam:    VS:  BP 128/68 (BP Location: Right Arm, Patient Position: Sitting, Cuff Size: Normal)   Pulse 80   Ht 5\' 8"  (1.727 m)   Wt 168 lb 12.8 oz (76.6 kg)   SpO2 96%   BMI 25.67 kg/m     Wt Readings from Last 3 Encounters:  09/27/18 168 lb 12.8 oz (76.6 kg)  08/14/18 163 lb 2.3 oz (74 kg)  08/08/18 163 lb 8 oz (74.2 kg)     GEN:  Well nourished, well developed in no acute distress HEENT: Normal NECK: No JVD; No carotid bruits LYMPHATICS: No lymphadenopathy CARDIAC: RRR, no murmurs, rubs, gallops RESPIRATORY:  Clear to auscultation without rales, wheezing or rhonchi  ABDOMEN: Soft, non-tender, non-distended MUSCULOSKELETAL:  4+ bilateral to the knee R > L  edema; No deformity  SKIN: Warm and dry NEUROLOGIC:   Alert and oriented x 3 PSYCHIATRIC:  Normal affect    Signed, Shirlee More, MD  09/27/2018 4:26 PM    [place]

## 2018-09-27 ENCOUNTER — Ambulatory Visit (INDEPENDENT_AMBULATORY_CARE_PROVIDER_SITE_OTHER): Payer: Medicare Other | Admitting: Cardiology

## 2018-09-27 ENCOUNTER — Encounter: Payer: Self-pay | Admitting: Cardiology

## 2018-09-27 VITALS — BP 128/68 | HR 80 | Ht 68.0 in | Wt 168.8 lb

## 2018-09-27 DIAGNOSIS — R2689 Other abnormalities of gait and mobility: Secondary | ICD-10-CM | POA: Diagnosis not present

## 2018-09-27 DIAGNOSIS — M25461 Effusion, right knee: Secondary | ICD-10-CM | POA: Diagnosis not present

## 2018-09-27 DIAGNOSIS — R600 Localized edema: Secondary | ICD-10-CM

## 2018-09-27 DIAGNOSIS — M7989 Other specified soft tissue disorders: Secondary | ICD-10-CM

## 2018-09-27 DIAGNOSIS — M25561 Pain in right knee: Secondary | ICD-10-CM | POA: Diagnosis not present

## 2018-09-27 DIAGNOSIS — I25118 Atherosclerotic heart disease of native coronary artery with other forms of angina pectoris: Secondary | ICD-10-CM | POA: Diagnosis not present

## 2018-09-27 DIAGNOSIS — E785 Hyperlipidemia, unspecified: Secondary | ICD-10-CM

## 2018-09-27 DIAGNOSIS — Z96651 Presence of right artificial knee joint: Secondary | ICD-10-CM | POA: Diagnosis not present

## 2018-09-27 DIAGNOSIS — M62551 Muscle wasting and atrophy, not elsewhere classified, right thigh: Secondary | ICD-10-CM | POA: Diagnosis not present

## 2018-09-27 DIAGNOSIS — I10 Essential (primary) hypertension: Secondary | ICD-10-CM

## 2018-09-27 MED ORDER — SPIRONOLACTONE 25 MG PO TABS: 25.0000 mg | ORAL_TABLET | Freq: Every day | ORAL | 0 refills | Status: DC

## 2018-09-27 MED ORDER — FUROSEMIDE 20 MG PO TABS: ORAL_TABLET | ORAL | 1 refills | Status: DC

## 2018-09-27 NOTE — Patient Instructions (Addendum)
Medication Instructions:  Your physician has recommended you make the following change in your medication:   START furosemide (lasix) 20 mg: Take 1 tablet (20 mg) daily for 3 days, then take 1 tablet (20 mg) once or twice weekly as needed for swelling  If you need a refill on your cardiac medications before your next appointment, please call your pharmacy.   Lab work: Your physician recommends that you return for lab work in 1 week: BMP, ProBNP. Please return to our office for lab work, no appointment needed. No need to fast beforehand.   If you have labs (blood work) drawn today and your tests are completely normal, you will receive your results only by: Marland Kitchen MyChart Message (if you have MyChart) OR . A paper copy in the mail If you have any lab test that is abnormal or we need to change your treatment, we will call you to review the results.  Testing/Procedures: Your physician has requested that you have a lower extremity venous duplex. This test is an ultrasound of the veins in the legs. It looks at venous blood flow that carries blood from the heart to the legs. Allow one hour for a Lower Venous exam. There are no restrictions or special instructions.   Follow-Up: At [facility], you and your health needs are our priority.  As part of our continuing mission to provide you with exceptional heart care, we have created designated Provider Care Teams.  These Care Teams include your primary Cardiologist (physician) and Advanced Practice Providers (APPs -  Physician Assistants and Nurse Practitioners) who all work together to provide you with the care you need, when you need it. You will need a follow up appointment in 6 weeks.      Furosemide tablets What is this medicine? FUROSEMIDE (fyoor [place] se mide) is a diuretic. It helps you make more urine and to lose salt and excess water from your body. This medicine is used to treat high blood pressure, and edema or swelling from heart, kidney, or  liver disease. This medicine may be used for other purposes; ask your health care provider or pharmacist if you have questions. COMMON BRAND NAME(S): Active-Medicated Specimen Kit, Delone, Diuscreen, Lasix, RX Specimen Collection Kit, Specimen Collection Kit, URINX Medicated Specimen Collection What should I tell my health care provider before I take this medicine? They need to know if you have any of these conditions: -abnormal blood electrolytes -diarrhea or vomiting -gout -heart disease -kidney disease, small amounts of urine, or difficulty passing urine -liver disease -thyroid disease -an unusual or allergic reaction to furosemide, sulfa drugs, other medicines, foods, dyes, or preservatives -pregnant or trying to get pregnant -breast-feeding How should I use this medicine? Take this medicine by mouth with a glass of water. Follow the directions on the prescription label. You may take this medicine with or without food. If it upsets your stomach, take it with food or milk. Do not take your medicine more often than directed. Remember that you will need to pass more urine after taking this medicine. Do not take your medicine at a time of day that will cause you problems. Do not take at bedtime. Talk to your pediatrician regarding the use of this medicine in children. While this drug may be prescribed for selected conditions, precautions do apply. Overdosage: If you think you have taken too much of this medicine contact a poison control center or emergency room at once. NOTE: This medicine is only for you. Do not share  this medicine with others. What if I miss a dose? If you miss a dose, take it as soon as you can. If it is almost time for your next dose, take only that dose. Do not take double or extra doses. What may interact with this medicine? -aspirin and aspirin-like medicines -certain antibiotics -chloral  hydrate -cisplatin -cyclosporine -digoxin -diuretics -laxatives -lithium -medicines for blood pressure -medicines that relax muscles for surgery -methotrexate -NSAIDs, medicines for pain and inflammation like ibuprofen, naproxen, or indomethacin -phenytoin -steroid medicines like prednisone or cortisone -sucralfate -thyroid hormones This list may not describe all possible interactions. Give your health care provider a list of all the medicines, herbs, non-prescription drugs, or dietary supplements you use. Also tell them if you smoke, drink alcohol, or use illegal drugs. Some items may interact with your medicine. What should I watch for while using this medicine? Visit your doctor or health care professional for regular checks on your progress. Check your blood pressure regularly. Ask your doctor or health care professional what your blood pressure should be, and when you should contact him or her. If you are a diabetic, check your blood sugar as directed. You may need to be on a special diet while taking this medicine. Check with your doctor. Also, ask how many glasses of fluid you need to drink a day. You must not get dehydrated. You may get drowsy or dizzy. Do not drive, use machinery, or do anything that needs mental alertness until you know how this drug affects you. Do not stand or sit up quickly, especially if you are an older patient. This reduces the risk of dizzy or fainting spells. Alcohol can make you more drowsy and dizzy. Avoid alcoholic drinks. This medicine can make you more sensitive to the sun. Keep out of the sun. If you cannot avoid being in the sun, wear protective clothing and use sunscreen. Do not use sun lamps or tanning beds/booths. What side effects may I notice from receiving this medicine? Side effects that you should report to your doctor or health care professional as soon as possible: -blood in urine or stools -dry mouth -fever or chills -hearing loss or  ringing in the ears -irregular heartbeat -muscle pain or weakness, cramps -skin rash -stomach upset, pain, or nausea -tingling or numbness in the hands or feet -unusually weak or tired -vomiting or diarrhea -yellowing of the eyes or skin Side effects that usually do not require medical attention (report to your doctor or health care professional if they continue or are bothersome): -headache -loss of appetite -unusual bleeding or bruising This list may not describe all possible side effects. Call your doctor for medical advice about side effects. You may report side effects to FDA at 1-800-FDA-1088. Where should I keep my medicine? Keep out of the reach of children. Store at room temperature between 15 and 30 degrees C (59 and 86 degrees F). Protect from light. Throw away any unused medicine after the expiration date. NOTE: This sheet is a summary. It may not cover all possible information. If you have questions about this medicine, talk to your doctor, pharmacist, or health care provider.  2019 Elsevier/Gold Standard (2014-10-02 13:49:50)

## 2018-09-29 DIAGNOSIS — Z96651 Presence of right artificial knee joint: Secondary | ICD-10-CM | POA: Diagnosis not present

## 2018-09-29 DIAGNOSIS — M25561 Pain in right knee: Secondary | ICD-10-CM | POA: Diagnosis not present

## 2018-09-29 DIAGNOSIS — R2689 Other abnormalities of gait and mobility: Secondary | ICD-10-CM | POA: Diagnosis not present

## 2018-09-29 DIAGNOSIS — M25461 Effusion, right knee: Secondary | ICD-10-CM | POA: Diagnosis not present

## 2018-09-29 DIAGNOSIS — M62551 Muscle wasting and atrophy, not elsewhere classified, right thigh: Secondary | ICD-10-CM | POA: Diagnosis not present

## 2018-10-02 DIAGNOSIS — M7989 Other specified soft tissue disorders: Secondary | ICD-10-CM | POA: Diagnosis not present

## 2018-10-03 DIAGNOSIS — Z96651 Presence of right artificial knee joint: Secondary | ICD-10-CM | POA: Diagnosis not present

## 2018-10-03 DIAGNOSIS — M62551 Muscle wasting and atrophy, not elsewhere classified, right thigh: Secondary | ICD-10-CM | POA: Diagnosis not present

## 2018-10-03 DIAGNOSIS — M25561 Pain in right knee: Secondary | ICD-10-CM | POA: Diagnosis not present

## 2018-10-03 DIAGNOSIS — M25461 Effusion, right knee: Secondary | ICD-10-CM | POA: Diagnosis not present

## 2018-10-03 DIAGNOSIS — R2689 Other abnormalities of gait and mobility: Secondary | ICD-10-CM | POA: Diagnosis not present

## 2018-10-05 ENCOUNTER — Other Ambulatory Visit: Payer: Self-pay | Admitting: *Deleted

## 2018-10-05 DIAGNOSIS — R600 Localized edema: Secondary | ICD-10-CM | POA: Diagnosis not present

## 2018-10-05 DIAGNOSIS — Z79899 Other long term (current) drug therapy: Secondary | ICD-10-CM

## 2018-10-05 DIAGNOSIS — M25461 Effusion, right knee: Secondary | ICD-10-CM | POA: Diagnosis not present

## 2018-10-05 DIAGNOSIS — I480 Paroxysmal atrial fibrillation: Secondary | ICD-10-CM

## 2018-10-05 DIAGNOSIS — I10 Essential (primary) hypertension: Secondary | ICD-10-CM

## 2018-10-05 DIAGNOSIS — M62551 Muscle wasting and atrophy, not elsewhere classified, right thigh: Secondary | ICD-10-CM | POA: Diagnosis not present

## 2018-10-05 DIAGNOSIS — M25561 Pain in right knee: Secondary | ICD-10-CM | POA: Diagnosis not present

## 2018-10-05 DIAGNOSIS — R2689 Other abnormalities of gait and mobility: Secondary | ICD-10-CM | POA: Diagnosis not present

## 2018-10-05 DIAGNOSIS — Z96651 Presence of right artificial knee joint: Secondary | ICD-10-CM | POA: Diagnosis not present

## 2018-10-05 NOTE — Progress Notes (Signed)
Bmp

## 2018-10-06 LAB — BASIC METABOLIC PANEL
BUN/Creatinine Ratio: 19 (ref 10–24)
BUN: 23 mg/dL (ref 8–27)
CO2: 23 mmol/L (ref 20–29)
Calcium: 9.2 mg/dL (ref 8.6–10.2)
Chloride: 102 mmol/L (ref 96–106)
Creatinine, Ser: 1.21 mg/dL (ref 0.76–1.27)
GFR calc Af Amer: 63 mL/min/{1.73_m2} (ref >59)
GFR, EST NON AFRICAN AMERICAN: 55 mL/min/{1.73_m2} — AB (ref >59)
Glucose: 97 mg/dL (ref 65–99)
Potassium: 4.6 mmol/L (ref 3.5–5.2)
Sodium: 136 mmol/L (ref 134–144)

## 2018-10-06 LAB — PRO B NATRIURETIC PEPTIDE: NT-Pro BNP: 275 pg/mL (ref 0–486)

## 2018-10-13 DIAGNOSIS — M25561 Pain in right knee: Secondary | ICD-10-CM | POA: Diagnosis not present

## 2018-10-13 DIAGNOSIS — M25461 Effusion, right knee: Secondary | ICD-10-CM | POA: Diagnosis not present

## 2018-10-13 DIAGNOSIS — M62551 Muscle wasting and atrophy, not elsewhere classified, right thigh: Secondary | ICD-10-CM | POA: Diagnosis not present

## 2018-10-13 DIAGNOSIS — R2689 Other abnormalities of gait and mobility: Secondary | ICD-10-CM | POA: Diagnosis not present

## 2018-10-13 DIAGNOSIS — Z96651 Presence of right artificial knee joint: Secondary | ICD-10-CM | POA: Diagnosis not present

## 2018-10-18 DIAGNOSIS — M25561 Pain in right knee: Secondary | ICD-10-CM | POA: Diagnosis not present

## 2018-10-18 DIAGNOSIS — Z96651 Presence of right artificial knee joint: Secondary | ICD-10-CM | POA: Diagnosis not present

## 2018-10-18 DIAGNOSIS — M25461 Effusion, right knee: Secondary | ICD-10-CM | POA: Diagnosis not present

## 2018-10-18 DIAGNOSIS — M62551 Muscle wasting and atrophy, not elsewhere classified, right thigh: Secondary | ICD-10-CM | POA: Diagnosis not present

## 2018-10-18 DIAGNOSIS — R2689 Other abnormalities of gait and mobility: Secondary | ICD-10-CM | POA: Diagnosis not present

## 2018-10-20 DIAGNOSIS — M62551 Muscle wasting and atrophy, not elsewhere classified, right thigh: Secondary | ICD-10-CM | POA: Diagnosis not present

## 2018-10-20 DIAGNOSIS — M25561 Pain in right knee: Secondary | ICD-10-CM | POA: Diagnosis not present

## 2018-10-20 DIAGNOSIS — M25461 Effusion, right knee: Secondary | ICD-10-CM | POA: Diagnosis not present

## 2018-10-20 DIAGNOSIS — Z96651 Presence of right artificial knee joint: Secondary | ICD-10-CM | POA: Diagnosis not present

## 2018-10-20 DIAGNOSIS — R2689 Other abnormalities of gait and mobility: Secondary | ICD-10-CM | POA: Diagnosis not present

## 2018-11-03 ENCOUNTER — Telehealth: Payer: Self-pay | Admitting: Cardiology

## 2018-11-03 NOTE — Telephone Encounter (Signed)
Called pt to discuss televisit for his appt 04/16 @ 2 and pt stated he wanted Dr Bettina Gavia to look at his legs and did not want to do a televisit.   Please advise.Marland KitchenMarland KitchenMarland Kitchen

## 2018-11-03 NOTE — Telephone Encounter (Signed)
Called patient and explained that he would be having a virtual visit with [facility]. He was advised that he would be able to see Dr Bettina Gavia, and Dr Bettina Gavia would be able to see him so if he is having swelling in his legs Dr Antionette Char would be able to see his legs as well.  Patient agreed to virtual visit and gave verbal consent to proceed.       TELEPHONE CALL NOTE  Ronald Dunn has been deemed a candidate for a follow-up tele-health visit to limit community exposure during the Covid-19 pandemic. I spoke with the patient via phone to ensure availability of phone/video source, confirm preferred email & phone number, and discuss instructions and expectations.  I reminded Ronald Dunn to be prepared with any vital sign and/or heart rhythm information that could potentially be obtained via home monitoring, at the time of his visit. I reminded Ronald Dunn to expect a phone call at the time of his visit if his visit.  Did the patient verbally acknowledge consent to treatment? Yes  [name], [place] 11/03/2018 2:03 PM   DOWNLOADING THE [place], go to [employer] and type in WebEx in the search bar. [place] [employer], the blue/green circle. The app is free but as with any other app downloads, their phone may require them to verify saved payment information or Apple password. The patient does NOT have to create an account.  - If Android, ask patient to go to [employer] and type in WebEx in the search bar. [place] [employer], the blue/green circle. The app is free but as with any other app downloads, their phone may require them to verify saved payment information or Android password. The patient does NOT have to create an account.   CONSENT FOR TELE-HEALTH VISIT - PLEASE REVIEW  I hereby voluntarily request, consent and authorize [facility] and its employed or contracted physicians, physician assistants, nurse practitioners or other  licensed health care professionals (the Practitioner), to provide me with telemedicine health care services (the "Services") as deemed necessary by the treating Practitioner. I acknowledge and consent to receive the Services by the Practitioner via telemedicine. I understand that the telemedicine visit will involve communicating with the Practitioner through live audiovisual communication technology and the disclosure of certain medical information by electronic transmission. I acknowledge that I have been given the opportunity to request an in-person assessment or other available alternative prior to the telemedicine visit and am voluntarily participating in the telemedicine visit.  I understand that I have the right to withhold or withdraw my consent to the use of telemedicine in the course of my care at any time, without affecting my right to future care or treatment, and that the Practitioner or I may terminate the telemedicine visit at any time. I understand that I have the right to inspect all information obtained and/or recorded in the course of the telemedicine visit and may receive copies of available information for a reasonable fee.  I understand that some of the potential risks of receiving the Services via telemedicine include:  Marland Kitchen Delay or interruption in medical evaluation due to technological equipment failure or disruption; . Information transmitted may not be sufficient (e.g. poor resolution of images) to allow for appropriate medical decision making by the Practitioner; and/or  . In rare instances, security protocols could fail, causing a breach of personal health information.  Furthermore, I acknowledge that it is  my responsibility to provide information about my medical history, conditions and care that is complete and accurate to the best of my ability. I acknowledge that Practitioner's advice, recommendations, and/or decision may be based on factors not within their control, such as  incomplete or inaccurate data provided by me or distortions of diagnostic images or specimens that may result from electronic transmissions. I understand that the practice of medicine is not an exact science and that Practitioner makes no warranties or guarantees regarding treatment outcomes. I acknowledge that I will receive a copy of this consent concurrently upon execution via email to the email address I last provided but may also request a printed copy by calling the office of [name].    I understand that my insurance will be billed for this visit.   I have read or had this consent read to me. . I understand the contents of this consent, which adequately explains the benefits and risks of the Services being provided via telemedicine.  . I have been provided ample opportunity to ask questions regarding this consent and the Services and have had my questions answered to my satisfaction. . I give my informed consent for the services to be provided through the use of telemedicine in my medical care  By participating in this telemedicine visit I agree to the above.  Consent was given verbally and copy of consent sent to patient in [place].

## 2018-11-09 ENCOUNTER — Encounter: Payer: Self-pay | Admitting: Cardiology

## 2018-11-09 ENCOUNTER — Telehealth (INDEPENDENT_AMBULATORY_CARE_PROVIDER_SITE_OTHER): Payer: Medicare Other | Admitting: Cardiology

## 2018-11-09 ENCOUNTER — Other Ambulatory Visit: Payer: Self-pay

## 2018-11-09 VITALS — BP 144/80 | HR 79 | Ht 68.0 in | Wt 165.0 lb

## 2018-11-09 DIAGNOSIS — R600 Localized edema: Secondary | ICD-10-CM

## 2018-11-09 DIAGNOSIS — E785 Hyperlipidemia, unspecified: Secondary | ICD-10-CM

## 2018-11-09 DIAGNOSIS — I25118 Atherosclerotic heart disease of native coronary artery with other forms of angina pectoris: Secondary | ICD-10-CM

## 2018-11-09 DIAGNOSIS — I10 Essential (primary) hypertension: Secondary | ICD-10-CM

## 2018-11-09 MED ORDER — FUROSEMIDE 20 MG PO TABS: ORAL_TABLET | ORAL | 1 refills | Status: DC

## 2018-11-09 NOTE — Patient Instructions (Signed)
Medication Instructions:  Your physician recommends that you continue on your current medications as directed. Please refer to the Current Medication list given to you today.  If you need a refill on your cardiac medications before your next appointment, please call your pharmacy.   Lab work: None  If you have labs (blood work) drawn today and your tests are completely normal, you will receive your results only by: Marland Kitchen MyChart Message (if you have MyChart) OR . A paper copy in the mail If you have any lab test that is abnormal or we need to change your treatment, we will call you to review the results.  Testing/Procedures: None  Follow-Up: At [facility], you and your health needs are our priority.  As part of our continuing mission to provide you with exceptional heart care, we have created designated Provider Care Teams.  These Care Teams include your primary Cardiologist (physician) and Advanced Practice Providers (APPs -  Physician Assistants and Nurse Practitioners) who all work together to provide you with the care you need, when you need it. You will need a follow up appointment in 6 months.  Any Other Special Instructions Will Be Listed Below (If Applicable).

## 2018-11-09 NOTE — Addendum Note (Signed)
Addended by: Particia Nearing B on: 11/09/2018 02:44 PM   Modules accepted: Orders

## 2018-11-09 NOTE — Progress Notes (Signed)
Virtual Visit via Video Note   This visit type was conducted due to national recommendations for restrictions regarding the COVID-19 Pandemic (e.g. social distancing) in an effort to limit this patient's exposure and mitigate transmission in our community.  Due to his co-morbid illnesses, this patient is at least at moderate risk for complications without adequate follow up.  This format is felt to be most appropriate for this patient at this time.  All issues noted in this document were discussed and addressed.  A limited physical exam was performed with this format.  Please refer to the patient's chart for his consent to telehealth for [facility].   Evaluation Performed:  Follow-up visit  Date:  11/09/2018   ID:  Ronald Dunn, DOB [birth year], MRN [id number]  Patient Location: Home Provider Location: Office  PCP:  Angelina Sheriff, MD  Cardiologist:  No primary care provider on file. Dr Bettina Gavia Electrophysiologist:  None   Chief Complaint:  FU for CAD and leg swelling  History of Present Illness:    Ronald Dunn is a 85 y.o. male with a hx of CAD, Dyslipidemia, HTN, S/P CABG 08/26/15 last seen  09/27/18 with marked edema after total knee arthroplasty.  He underwent duplex lower extremities showed no evidence of deep vein thrombosis.  He was placed on furosemide to alleviate his swelling.  His leg subsequently improved and he takes furosemide perhaps once a week elevates his leg and wear support hose.  He does have pain in the leg at nighttime is alleviated by elevating the leg but he does not have claudication.  He has had no chest pain shortness of breath palpitation or syncope.  He remains active exercises on a regular basis  The patient does not have symptoms concerning for COVID-19 infection (fever, chills, cough, or new shortness of breath).    Past Medical History:  Diagnosis Date  . Abnormal stress echocardiogram 08/16/2015   Overview:  With post exercise LV dilation   . Arthritis 04/14/2017  . Borderline anemia 05/2018   no medication prescribed at this time  . Chronic ischemic heart disease   . CKD (chronic kidney disease) 04/14/2017  . Coronary artery disease involving native coronary artery of native heart 04/14/2017  . Coronary artery disease with stable angina pectoris ([place]) 09/01/2015   pt denies chest pain  . DVT (deep venous thrombosis) (HCC)    Right leg  . Dyspnea 2017  . Essential hypertension 07/22/2015  . Gout 04/14/2017  . Hyperlipidemia 07/22/2015  . Incomplete right bundle branch block (RBBB)    Noted on EKG 01/31/2018  . 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 Leukocytosis 04/14/2017  . Nocturia   . Obesity 04/14/2017  . On amiodarone therapy 09/01/2015  . PAF (paroxysmal atrial fibrillation) ([place]) 09/01/2015   pt unaware  . Pleural effusion 09/22/2015   Overview:  Left, post CABG with US guided thoracentesis  . Premature ventricular contractions (PVCs) (VPCs) 04/14/2017  . S/P CABG (coronary artery bypass graft) 04/14/2017  . Skin cancer (melanoma) ([place]) 04/14/2017  . Urinary frequency   . Urinary urgency   . Venous insufficiency   . Vitamin B 12 deficiency    Past Surgical History:  Procedure Laterality Date  . CARDIAC CATHETERIZATION    . COLONOSCOPY    . CORNEAL TRANSPLANT Bilateral   . CORONARY ARTERY BYPASS GRAFT  08/26/2015  . FOOT SURGERY Right   . TOTAL KNEE ARTHROPLASTY Right 08/14/2018   Procedure: TOTAL KNEE ARTHROPLASTY;  Surgeon: Ronnie Derby,  Richardson Landry, MD;  Location: [facility];  Service: Orthopedics;  Laterality: Right;     Current Meds  Medication Sig  . allopurinol (ZYLOPRIM) 100 MG tablet Take 100 mg by mouth 2 (two) times daily.   Marland Kitchen aspirin EC 81 MG tablet Take 81 mg by mouth daily.  Marland Kitchen ezetimibe (ZETIA) 10 MG tablet TAKE 1 TABLET BY MOUTH ONCE DAILY  . furosemide (LASIX) 20 MG tablet Take 1 tablet (20 mg) daily for 3 days, then take 1-2 weekly as needed for swelling.  . loteprednol (LOTEMAX) 0.5 % ophthalmic suspension Place 1  drop into both eyes every morning.   . methocarbamol (ROBAXIN) 500 MG tablet Take 1-2 tablets (500-1,000 mg total) by mouth every 6 (six) hours as needed for muscle spasms.  . Multiple Vitamin (MULTIVITAMIN) capsule Take 1 capsule by mouth daily.   Marland Kitchen spironolactone (ALDACTONE) 25 MG tablet Take 1 tablet (25 mg total) by mouth daily.     Allergies:   Patient has no known allergies.   Social History   Tobacco Use  . Smoking status: Never Smoker  . Smokeless tobacco: Never Used  Substance Use Topics  . Alcohol use: Yes    Alcohol/week: 1.0 standard drinks    Types: 1 Glasses of wine per week    Comment: 5 days per week   . Drug use: No     Family Hx: The patient's family history includes Heart disease in his father; Heart failure in his father.  ROS:   Please see the history of present illness.     All other systems reviewed and are negative.   Prior CV studies:   The following studies were reviewed today:  Recent lower extremity venous duplex showed no evidence of right lower extremity thrombophlebitis he was done the same day as his office visit previous  Labs/Other Tests and Data Reviewed:    EKG:  No ECG reviewed.  Recent Labs: 08/08/2018: ALT 19 08/15/2018: Hemoglobin 11.4; Platelets 153 10/05/2018: BUN 23; Creatinine, Ser 1.21; NT-Pro BNP 275; Potassium 4.6; Sodium 136   Recent Lipid Panel Lab Results  Component Value Date/Time   CHOL 176 05/22/2018 08:31 AM   TRIG 93 05/22/2018 08:31 AM   HDL 64 05/22/2018 08:31 AM   CHOLHDL 2.8 05/22/2018 08:31 AM   LDLCALC 93 05/22/2018 08:31 AM    Wt Readings from Last 3 Encounters:  11/09/18 165 lb (74.8 kg)  09/27/18 168 lb 12.8 oz (76.6 kg)  08/14/18 163 lb 2.3 oz (74 kg)     Objective:    Vital Signs:  BP (!) 144/80 (BP Location: Left Arm, Patient Position: Sitting)   Pulse 79   Ht 5\' 8"  (1.727 m)   Wt 165 lb (74.8 kg)   BMI 25.09 kg/m    Constitutional, well-nourished well-developed in no acute distress  Vital signs reviewed Eyes, conjunctiva and sclera are normal without pallor or icterus extraocular motions intact and normal there is no lid lag Respiratory, normal effort and excursion no audible wheezing without a stethoscope Cardiovascular, no neck vein distention.  Is very mild edema lower extremities knee to ankle little bit more on the right than the left  skin, no rash skin lesion or ulceration of the extremities Neurologic, cranial nerves II to XII are grossly intact and the patient moves all 4 extremities Neuro/Psychiatric, judgment and thought processes are intact and coherent, alert and oriented x3, mood and affect appear normal.  ASSESSMENT & PLAN:    1. CAD stable he has no angina  on current medications and after surgical revascularization continue medical therapy including aspirin lipid-lowering with Zetia with statin intolerance.  He will resume his regular cardiovascular exercise as he is finished physical therapy after total knee arthroplasty 2. Hypertension stable continue his distal diuretic and as needed furosemide 3. Hyperlipidemia stable continue his Zetia next visit we will check for lipid profile and if lipids are not at target consider treatment with PCSK9 4. Improved continue intermittent diuretic furosemide as needed along with elevation and support hose  COVID-19 Education: The signs and symptoms of COVID-19 were discussed with the patient and how to seek care for testing (follow up with PCP or arrange E-visit).  The importance of social distancing was discussed today.  Time:   Today, I have spent 30 minutes with the patient with telehealth technology discussing the above problems.     Medication Adjustments/Labs and Tests Ordered: Current medicines are reviewed at length with the patient today.  Concerns regarding medicines are outlined above.   Tests Ordered: No orders of the defined types were placed in this encounter.   Medication Changes: No orders of  the defined types were placed in this encounter.   Disposition:  Follow up in 6 month(s)  Signed, Shirlee More, MD  11/09/2018 2:31 PM    [facility]

## 2018-11-16 DIAGNOSIS — Z96651 Presence of right artificial knee joint: Secondary | ICD-10-CM | POA: Diagnosis not present

## 2018-12-17 ENCOUNTER — Other Ambulatory Visit: Payer: Self-pay | Admitting: Cardiology

## 2018-12-19 DIAGNOSIS — D649 Anemia, unspecified: Secondary | ICD-10-CM | POA: Diagnosis not present

## 2019-01-12 DIAGNOSIS — N183 Chronic kidney disease, stage 3 (moderate): Secondary | ICD-10-CM | POA: Diagnosis not present

## 2019-01-12 DIAGNOSIS — Z6823 Body mass index (BMI) 23.0-23.9, adult: Secondary | ICD-10-CM | POA: Diagnosis not present

## 2019-01-12 DIAGNOSIS — M791 Myalgia, unspecified site: Secondary | ICD-10-CM | POA: Diagnosis not present

## 2019-01-12 DIAGNOSIS — M1A09X0 Idiopathic chronic gout, multiple sites, without tophus (tophi): Secondary | ICD-10-CM | POA: Diagnosis not present

## 2019-01-12 DIAGNOSIS — M150 Primary generalized (osteo)arthritis: Secondary | ICD-10-CM | POA: Diagnosis not present

## 2019-02-07 DIAGNOSIS — H43813 Vitreous degeneration, bilateral: Secondary | ICD-10-CM | POA: Diagnosis not present

## 2019-02-15 DIAGNOSIS — D519 Vitamin B12 deficiency anemia, unspecified: Secondary | ICD-10-CM | POA: Diagnosis not present

## 2019-03-19 ENCOUNTER — Other Ambulatory Visit: Payer: Self-pay | Admitting: Cardiology

## 2019-03-28 DIAGNOSIS — D519 Vitamin B12 deficiency anemia, unspecified: Secondary | ICD-10-CM | POA: Diagnosis not present

## 2019-04-25 DIAGNOSIS — D519 Vitamin B12 deficiency anemia, unspecified: Secondary | ICD-10-CM | POA: Diagnosis not present

## 2019-04-25 DIAGNOSIS — Z23 Encounter for immunization: Secondary | ICD-10-CM | POA: Diagnosis not present

## 2019-04-28 ENCOUNTER — Other Ambulatory Visit: Payer: Self-pay | Admitting: Cardiology

## 2019-04-28 DIAGNOSIS — I10 Essential (primary) hypertension: Secondary | ICD-10-CM

## 2019-05-17 ENCOUNTER — Telehealth: Payer: Medicare Other | Admitting: Cardiology

## 2019-05-25 DIAGNOSIS — N529 Male erectile dysfunction, unspecified: Secondary | ICD-10-CM | POA: Diagnosis not present

## 2019-05-25 DIAGNOSIS — M1990 Unspecified osteoarthritis, unspecified site: Secondary | ICD-10-CM | POA: Diagnosis not present

## 2019-05-25 DIAGNOSIS — E7800 Pure hypercholesterolemia, unspecified: Secondary | ICD-10-CM | POA: Diagnosis not present

## 2019-05-31 DIAGNOSIS — D519 Vitamin B12 deficiency anemia, unspecified: Secondary | ICD-10-CM | POA: Diagnosis not present

## 2019-06-07 NOTE — Progress Notes (Signed)
Cardiology Office Note:    Date:  06/08/2019   ID:  Ronald Dunn, DOB [birth year], MRN [id number]  PCP:  Angelina Sheriff, MD  Cardiologist:  Shirlee More, MD    Referring MD: Angelina Sheriff, MD    ASSESSMENT:    1. Coronary artery disease of native artery of native heart with stable angina pectoris ([place])   2. Essential hypertension   3. Hyperlipidemia, unspecified hyperlipidemia type    PLAN:    In order of problems listed above:  1. Stable after bypass surgery having no anginal discomfort New York Heart Association class I continue current treatment including aspirin lipid-lowering therapy nonstatin Zetia. 2. Stable BP at target continue treatment low-dose loop diuretic and MRA check labs including renal function potassium 3. Continue Zetia check lipid profile if additional therapy is needed PCSK9 4. Chronic venous insufficiency stable he has been assisted with and continue a low-dose of a loop diuretic as needed   Next appointment: 6 months video visit if we still have a high prevalence of Covid 19   Medication Adjustments/Labs and Tests Ordered: Current medicines are reviewed at length with the patient today.  Concerns regarding medicines are outlined above.  No orders of the defined types were placed in this encounter.  No orders of the defined types were placed in this encounter.   Chief Complaint  Patient presents with  . Follow-up  . Coronary Artery Disease    History of Present Illness:    Ronald Dunn is a 85 y.o. male with a hx of CAD, Dyslipidemia, HTN, S/P CABG and chronic venous insufficiency   last seen 11/09/2018. Compliance with diet, lifestyle and medications: Yes  In general he is done well remains vigorous and active no angina dyspnea palpitation has lower extremity edema is chronic and improved.  He was intolerant of statins with muscle weakness is on Zetia we will recheck a lipid profile and if additional therapy is needed PCSK9  would be appropriate. Past Medical History:  Diagnosis Date  . Abnormal stress echocardiogram 08/16/2015   Overview:  With post exercise LV dilation  . Arthritis 04/14/2017  . Borderline anemia 05/2018   no medication prescribed at this time  . Chronic ischemic heart disease   . CKD (chronic kidney disease) 04/14/2017  . Coronary artery disease involving native coronary artery of native heart 04/14/2017  . Coronary artery disease with stable angina pectoris ([place]) 09/01/2015   pt denies chest pain  . DVT (deep venous thrombosis) (HCC)    Right leg  . Dyspnea 2017  . Essential hypertension 07/22/2015  . Gout 04/14/2017  . Hyperlipidemia 07/22/2015  . Incomplete right bundle branch block (RBBB)    Noted on EKG 01/31/2018  . 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 Leukocytosis 04/14/2017  . Nocturia   . Obesity 04/14/2017  . On amiodarone therapy 09/01/2015  . PAF (paroxysmal atrial fibrillation) ([place]) 09/01/2015   pt unaware  . Pleural effusion 09/22/2015   Overview:  Left, post CABG with US guided thoracentesis  . Premature ventricular contractions (PVCs) (VPCs) 04/14/2017  . S/P CABG (coronary artery bypass graft) 04/14/2017  . Skin cancer (melanoma) ([place]) 04/14/2017  . Urinary frequency   . Urinary urgency   . Venous insufficiency   . Vitamin B 12 deficiency     Past Surgical History:  Procedure Laterality Date  . CARDIAC CATHETERIZATION    . COLONOSCOPY    . CORNEAL TRANSPLANT Bilateral   . CORONARY ARTERY BYPASS GRAFT  08/26/2015  . FOOT SURGERY Right   . TOTAL KNEE ARTHROPLASTY Right 08/14/2018   Procedure: TOTAL KNEE ARTHROPLASTY;  Surgeon: Vickey Huger, MD;  Location: [facility];  Service: Orthopedics;  Laterality: Right;    Current Medications: Current Meds  Medication Sig  . allopurinol (ZYLOPRIM) 100 MG tablet Take 100 mg by mouth 2 (two) times daily.   Marland Kitchen aspirin EC 81 MG tablet Take 81 mg by mouth daily.  Marland Kitchen ezetimibe (ZETIA) 10 MG tablet Take 1 tablet by mouth once daily  .  furosemide (LASIX) 20 MG tablet Take 20 mg by mouth daily as needed.  . loteprednol (LOTEMAX) 0.5 % ophthalmic suspension Place 1 drop into both eyes every morning.   . Multiple Vitamin (MULTIVITAMIN) capsule Take 1 capsule by mouth daily.   Marland Kitchen spironolactone (ALDACTONE) 25 MG tablet Take 1 tablet by mouth once daily     Allergies:   Patient has no known allergies.   Social History   Socioeconomic History  . Marital status: Married    Spouse name: Not on file  . Number of children: Not on file  . Years of education: Not on file  . Highest education level: Not on file  Occupational History  . Not on file  Social Needs  . Financial resource strain: Not on file  . Food insecurity    Worry: Not on file    Inability: Not on file  . Transportation needs    Medical: Not on file    Non-medical: Not on file  Tobacco Use  . Smoking status: Never Smoker  . Smokeless tobacco: Never Used  Substance and Sexual Activity  . Alcohol use: Yes    Alcohol/week: 1.0 standard drinks    Types: 1 Glasses of wine per week    Comment: 5 days per week   . Drug use: No  . Sexual activity: Not on file  Lifestyle  . Physical activity    Days per week: Not on file    Minutes per session: Not on file  . Stress: Not on file  Relationships  . Social Herbalist on phone: Not on file    Gets together: Not on file    Attends religious service: Not on file    Active member of club or organization: Not on file    Attends meetings of clubs or organizations: Not on file    Relationship status: Not on file  Other Topics Concern  . Not on file  Social History Narrative  . Not on file     Family History: The patient's family history includes Heart disease in his father; Heart failure in his father. ROS:   Please see the history of present illness.    All other systems reviewed and are negative.  EKGs/Labs/Other Studies Reviewed:    The following studies were reviewed today:  EKG:  EKG  ordered today and personally reviewed.  The ekg ordered today demonstrates sinus rhythm Q-wave in V2 otherwise normal  Recent Labs: 08/08/2018: ALT 19 08/15/2018: Hemoglobin 11.4; Platelets 153 10/05/2018: BUN 23; Creatinine, Ser 1.21; NT-Pro BNP 275; Potassium 4.6; Sodium 136  Recent Lipid Panel    Component Value Date/Time   CHOL 176 05/22/2018 0831   TRIG 93 05/22/2018 0831   HDL 64 05/22/2018 0831   CHOLHDL 2.8 05/22/2018 0831   LDLCALC 93 05/22/2018 0831    Physical Exam:    VS:  BP 120/80 (BP Location: Right Arm, Patient Position: Sitting, Cuff Size: Normal)  Pulse 97   Ht 5\' 8"  (1.727 m)   Wt 160 lb 6.4 oz (72.8 kg)   SpO2 98%   BMI 24.39 kg/m     Wt Readings from Last 3 Encounters:  06/08/19 160 lb 6.4 oz (72.8 kg)  11/09/18 165 lb (74.8 kg)  09/27/18 168 lb 12.8 oz (76.6 kg)     GEN:  Well nourished, well developed in no acute distress HEENT: Normal NECK: No JVD; No carotid bruits LYMPHATICS: No lymphadenopathy CARDIAC: RRR, no murmurs, rubs, gallops RESPIRATORY:  Clear to auscultation without rales, wheezing or rhonchi  ABDOMEN: Soft, non-tender, non-distended MUSCULOSKELETAL: Nonpitting lower extremity predominantly at the ankle dependent edema; No deformity  SKIN: Warm and dry NEUROLOGIC:  Alert and oriented x 3 PSYCHIATRIC:  Normal affect    Signed, Shirlee More, MD  06/08/2019 4:15 PM    [facility]

## 2019-06-08 ENCOUNTER — Encounter: Payer: Self-pay | Admitting: Cardiology

## 2019-06-08 ENCOUNTER — Other Ambulatory Visit: Payer: Self-pay

## 2019-06-08 ENCOUNTER — Ambulatory Visit (INDEPENDENT_AMBULATORY_CARE_PROVIDER_SITE_OTHER): Payer: Medicare Other | Admitting: Cardiology

## 2019-06-08 VITALS — BP 120/80 | HR 97 | Ht 68.0 in | Wt 160.4 lb

## 2019-06-08 DIAGNOSIS — I10 Essential (primary) hypertension: Secondary | ICD-10-CM | POA: Diagnosis not present

## 2019-06-08 DIAGNOSIS — I25118 Atherosclerotic heart disease of native coronary artery with other forms of angina pectoris: Secondary | ICD-10-CM

## 2019-06-08 DIAGNOSIS — E785 Hyperlipidemia, unspecified: Secondary | ICD-10-CM

## 2019-06-08 MED ORDER — EZETIMIBE 10 MG PO TABS: 10.0000 mg | ORAL_TABLET | Freq: Every day | ORAL | 1 refills | Status: DC

## 2019-06-08 MED ORDER — SPIRONOLACTONE 25 MG PO TABS: 25.0000 mg | ORAL_TABLET | Freq: Every day | ORAL | 1 refills | Status: DC

## 2019-06-08 NOTE — Patient Instructions (Signed)
Medication Instructions:  Your physician recommends that you continue on your current medications as directed. Please refer to the Current Medication list given to you today.  *If you need a refill on your cardiac medications before your next appointment, please call your pharmacy*  Lab Work: Your physician recommends that you return for lab work today: CMP, lipid panel.  If you have labs (blood work) drawn today and your tests are completely normal, you will receive your results only by: Marland Kitchen MyChart Message (if you have MyChart) OR . A paper copy in the mail If you have any lab test that is abnormal or we need to change your treatment, we will call you to review the results.  Testing/Procedures: You had an EKG today.   Follow-Up: At [facility], you and your health needs are our priority.  As part of our continuing mission to provide you with exceptional heart care, we have created designated Provider Care Teams.  These Care Teams include your primary Cardiologist (physician) and Advanced Practice Providers (APPs -  Physician Assistants and Nurse Practitioners) who all work together to provide you with the care you need, when you need it.  Your next appointment:   6 months  The format for your next appointment:   In Person  Provider:   Shirlee More, MD

## 2019-06-09 LAB — COMPREHENSIVE METABOLIC PANEL
ALT: 22 IU/L (ref 0–44)
AST: 28 IU/L (ref 0–40)
Albumin/Globulin Ratio: 1.5 (ref 1.2–2.2)
Albumin: 4.3 g/dL (ref 3.6–4.6)
Alkaline Phosphatase: 79 IU/L (ref 39–117)
BUN/Creatinine Ratio: 21 (ref 10–24)
BUN: 24 mg/dL (ref 8–27)
Bilirubin Total: 0.5 mg/dL (ref 0.0–1.2)
CO2: 25 mmol/L (ref 20–29)
Calcium: 10.3 mg/dL — ABNORMAL HIGH (ref 8.6–10.2)
Chloride: 100 mmol/L (ref 96–106)
Creatinine, Ser: 1.13 mg/dL (ref 0.76–1.27)
GFR calc Af Amer: 69 mL/min/{1.73_m2} (ref >59)
GFR calc non Af Amer: 59 mL/min/{1.73_m2} — ABNORMAL LOW (ref >59)
Globulin, Total: 2.9 g/dL (ref 1.5–4.5)
Glucose: 83 mg/dL (ref 65–99)
Potassium: 4.6 mmol/L (ref 3.5–5.2)
Sodium: 139 mmol/L (ref 134–144)
Total Protein: 7.2 g/dL (ref 6.0–8.5)

## 2019-06-09 LAB — LIPID PANEL
Chol/HDL Ratio: 2.5 ratio (ref 0.0–5.0)
Cholesterol, Total: 187 mg/dL (ref 100–199)
HDL: 75 mg/dL (ref >39)
LDL Chol Calc (NIH): 95 mg/dL (ref 0–99)
Triglycerides: 94 mg/dL (ref 0–149)
VLDL Cholesterol Cal: 17 mg/dL (ref 5–40)

## 2019-06-25 DIAGNOSIS — D649 Anemia, unspecified: Secondary | ICD-10-CM | POA: Diagnosis not present

## 2019-07-03 DIAGNOSIS — D510 Vitamin B12 deficiency anemia due to intrinsic factor deficiency: Secondary | ICD-10-CM | POA: Diagnosis not present

## 2019-08-25 DIAGNOSIS — M109 Gout, unspecified: Secondary | ICD-10-CM | POA: Diagnosis not present

## 2019-08-25 DIAGNOSIS — I10 Essential (primary) hypertension: Secondary | ICD-10-CM | POA: Diagnosis not present

## 2019-08-25 DIAGNOSIS — E7800 Pure hypercholesterolemia, unspecified: Secondary | ICD-10-CM | POA: Diagnosis not present

## 2019-09-18 DIAGNOSIS — L821 Other seborrheic keratosis: Secondary | ICD-10-CM | POA: Diagnosis not present

## 2019-09-18 DIAGNOSIS — Z85820 Personal history of malignant melanoma of skin: Secondary | ICD-10-CM | POA: Diagnosis not present

## 2019-09-18 DIAGNOSIS — C44319 Basal cell carcinoma of skin of other parts of face: Secondary | ICD-10-CM | POA: Diagnosis not present

## 2019-09-20 DIAGNOSIS — I10 Essential (primary) hypertension: Secondary | ICD-10-CM | POA: Diagnosis not present

## 2019-09-20 DIAGNOSIS — I259 Chronic ischemic heart disease, unspecified: Secondary | ICD-10-CM | POA: Diagnosis not present

## 2019-09-20 DIAGNOSIS — M79602 Pain in left arm: Secondary | ICD-10-CM | POA: Diagnosis not present

## 2019-09-20 DIAGNOSIS — Z9181 History of falling: Secondary | ICD-10-CM | POA: Diagnosis not present

## 2019-09-20 DIAGNOSIS — M109 Gout, unspecified: Secondary | ICD-10-CM | POA: Diagnosis not present

## 2019-09-20 DIAGNOSIS — Z0000 Encounter for general adult medical examination without abnormal findings: Secondary | ICD-10-CM | POA: Diagnosis not present

## 2019-09-20 DIAGNOSIS — Z1331 Encounter for screening for depression: Secondary | ICD-10-CM | POA: Diagnosis not present

## 2019-09-25 DIAGNOSIS — M25512 Pain in left shoulder: Secondary | ICD-10-CM | POA: Diagnosis not present

## 2019-09-25 DIAGNOSIS — D519 Vitamin B12 deficiency anemia, unspecified: Secondary | ICD-10-CM | POA: Diagnosis not present

## 2019-10-05 DIAGNOSIS — M6281 Muscle weakness (generalized): Secondary | ICD-10-CM | POA: Diagnosis not present

## 2019-10-05 DIAGNOSIS — M25612 Stiffness of left shoulder, not elsewhere classified: Secondary | ICD-10-CM | POA: Diagnosis not present

## 2019-10-05 DIAGNOSIS — M25512 Pain in left shoulder: Secondary | ICD-10-CM | POA: Diagnosis not present

## 2019-10-08 DIAGNOSIS — M25512 Pain in left shoulder: Secondary | ICD-10-CM | POA: Diagnosis not present

## 2019-10-08 DIAGNOSIS — M6281 Muscle weakness (generalized): Secondary | ICD-10-CM | POA: Diagnosis not present

## 2019-10-08 DIAGNOSIS — M25612 Stiffness of left shoulder, not elsewhere classified: Secondary | ICD-10-CM | POA: Diagnosis not present

## 2019-10-11 DIAGNOSIS — M25612 Stiffness of left shoulder, not elsewhere classified: Secondary | ICD-10-CM | POA: Diagnosis not present

## 2019-10-11 DIAGNOSIS — M25512 Pain in left shoulder: Secondary | ICD-10-CM | POA: Diagnosis not present

## 2019-10-11 DIAGNOSIS — M6281 Muscle weakness (generalized): Secondary | ICD-10-CM | POA: Diagnosis not present

## 2019-10-15 DIAGNOSIS — M6281 Muscle weakness (generalized): Secondary | ICD-10-CM | POA: Diagnosis not present

## 2019-10-15 DIAGNOSIS — M25512 Pain in left shoulder: Secondary | ICD-10-CM | POA: Diagnosis not present

## 2019-10-15 DIAGNOSIS — M25612 Stiffness of left shoulder, not elsewhere classified: Secondary | ICD-10-CM | POA: Diagnosis not present

## 2019-10-18 DIAGNOSIS — M25512 Pain in left shoulder: Secondary | ICD-10-CM | POA: Diagnosis not present

## 2019-10-18 DIAGNOSIS — M25612 Stiffness of left shoulder, not elsewhere classified: Secondary | ICD-10-CM | POA: Diagnosis not present

## 2019-10-18 DIAGNOSIS — M6281 Muscle weakness (generalized): Secondary | ICD-10-CM | POA: Diagnosis not present

## 2019-10-22 DIAGNOSIS — M25612 Stiffness of left shoulder, not elsewhere classified: Secondary | ICD-10-CM | POA: Diagnosis not present

## 2019-10-22 DIAGNOSIS — M6281 Muscle weakness (generalized): Secondary | ICD-10-CM | POA: Diagnosis not present

## 2019-10-22 DIAGNOSIS — M25512 Pain in left shoulder: Secondary | ICD-10-CM | POA: Diagnosis not present

## 2019-10-24 DIAGNOSIS — I10 Essential (primary) hypertension: Secondary | ICD-10-CM | POA: Diagnosis not present

## 2019-10-24 DIAGNOSIS — E7800 Pure hypercholesterolemia, unspecified: Secondary | ICD-10-CM | POA: Diagnosis not present

## 2019-11-01 DIAGNOSIS — M6281 Muscle weakness (generalized): Secondary | ICD-10-CM | POA: Diagnosis not present

## 2019-11-01 DIAGNOSIS — M25612 Stiffness of left shoulder, not elsewhere classified: Secondary | ICD-10-CM | POA: Diagnosis not present

## 2019-11-01 DIAGNOSIS — M25512 Pain in left shoulder: Secondary | ICD-10-CM | POA: Diagnosis not present

## 2019-11-05 DIAGNOSIS — M25512 Pain in left shoulder: Secondary | ICD-10-CM | POA: Diagnosis not present

## 2019-11-05 DIAGNOSIS — M25612 Stiffness of left shoulder, not elsewhere classified: Secondary | ICD-10-CM | POA: Diagnosis not present

## 2019-11-05 DIAGNOSIS — M6281 Muscle weakness (generalized): Secondary | ICD-10-CM | POA: Diagnosis not present

## 2019-11-06 DIAGNOSIS — E538 Deficiency of other specified B group vitamins: Secondary | ICD-10-CM | POA: Diagnosis not present

## 2019-11-06 DIAGNOSIS — M19012 Primary osteoarthritis, left shoulder: Secondary | ICD-10-CM | POA: Diagnosis not present

## 2019-11-06 DIAGNOSIS — M25512 Pain in left shoulder: Secondary | ICD-10-CM | POA: Diagnosis not present

## 2019-11-08 DIAGNOSIS — M25612 Stiffness of left shoulder, not elsewhere classified: Secondary | ICD-10-CM | POA: Diagnosis not present

## 2019-11-08 DIAGNOSIS — M25512 Pain in left shoulder: Secondary | ICD-10-CM | POA: Diagnosis not present

## 2019-11-08 DIAGNOSIS — M6281 Muscle weakness (generalized): Secondary | ICD-10-CM | POA: Diagnosis not present

## 2019-11-13 DIAGNOSIS — M25612 Stiffness of left shoulder, not elsewhere classified: Secondary | ICD-10-CM | POA: Diagnosis not present

## 2019-11-13 DIAGNOSIS — M25512 Pain in left shoulder: Secondary | ICD-10-CM | POA: Diagnosis not present

## 2019-11-13 DIAGNOSIS — M6281 Muscle weakness (generalized): Secondary | ICD-10-CM | POA: Diagnosis not present

## 2019-11-15 DIAGNOSIS — M25512 Pain in left shoulder: Secondary | ICD-10-CM | POA: Diagnosis not present

## 2019-11-15 DIAGNOSIS — M25612 Stiffness of left shoulder, not elsewhere classified: Secondary | ICD-10-CM | POA: Diagnosis not present

## 2019-11-15 DIAGNOSIS — M6281 Muscle weakness (generalized): Secondary | ICD-10-CM | POA: Diagnosis not present

## 2019-11-26 DIAGNOSIS — M25612 Stiffness of left shoulder, not elsewhere classified: Secondary | ICD-10-CM | POA: Diagnosis not present

## 2019-11-26 DIAGNOSIS — M25512 Pain in left shoulder: Secondary | ICD-10-CM | POA: Diagnosis not present

## 2019-11-26 DIAGNOSIS — M6281 Muscle weakness (generalized): Secondary | ICD-10-CM | POA: Diagnosis not present

## 2019-11-29 DIAGNOSIS — M6281 Muscle weakness (generalized): Secondary | ICD-10-CM | POA: Diagnosis not present

## 2019-11-29 DIAGNOSIS — E538 Deficiency of other specified B group vitamins: Secondary | ICD-10-CM | POA: Diagnosis not present

## 2019-11-29 DIAGNOSIS — M25512 Pain in left shoulder: Secondary | ICD-10-CM | POA: Diagnosis not present

## 2019-11-29 DIAGNOSIS — M25612 Stiffness of left shoulder, not elsewhere classified: Secondary | ICD-10-CM | POA: Diagnosis not present

## 2019-12-21 ENCOUNTER — Ambulatory Visit (INDEPENDENT_AMBULATORY_CARE_PROVIDER_SITE_OTHER): Payer: Medicare Other | Admitting: Cardiology

## 2019-12-21 ENCOUNTER — Other Ambulatory Visit: Payer: Self-pay

## 2019-12-21 ENCOUNTER — Encounter: Payer: Self-pay | Admitting: Cardiology

## 2019-12-21 VITALS — BP 124/78 | HR 82 | Ht 68.0 in | Wt 164.4 lb

## 2019-12-21 DIAGNOSIS — E785 Hyperlipidemia, unspecified: Secondary | ICD-10-CM

## 2019-12-21 DIAGNOSIS — I10 Essential (primary) hypertension: Secondary | ICD-10-CM | POA: Diagnosis not present

## 2019-12-21 DIAGNOSIS — I25118 Atherosclerotic heart disease of native coronary artery with other forms of angina pectoris: Secondary | ICD-10-CM

## 2019-12-21 NOTE — Patient Instructions (Signed)

## 2019-12-21 NOTE — Progress Notes (Signed)
Cardiology Office Note:    Date:  12/21/2019   ID:  Ronald Dunn, DOB [birth year], MRN [id number]  PCP:  Ronald Sheriff, MD  Cardiologist:  Ronald More, MD    Referring MD: Ronald Sheriff, MD    ASSESSMENT:    1. Coronary artery disease of native artery of native heart with stable angina pectoris ([place])   2. Essential hypertension   3. Hyperlipidemia, unspecified hyperlipidemia type    PLAN:    In order of problems listed above:  1. Ronald Dunn is done well CAD is stable having no angina after CABG and on current medical treatment continue aspirin lipid-lowering therapy with nystatin at this time does not require an ischemia evaluation. 2. BP at target continue treatment MRA and intermittent furosemide for edema 3. Lipids at target continue with his nonstatin we will check a CMP and lipid profile today goal LDL less than 100   Next appointment: 6 months   Medication Adjustments/Labs and Tests Ordered: Current medicines are reviewed at length with the patient today.  Concerns regarding medicines are outlined above.  Orders Placed This Encounter  Procedures  . Comp Met (CMET)  . Lipid Profile   No orders of the defined types were placed in this encounter.   No chief complaint on file.   History of Present Illness:    Ronald Dunn is a 86 y.o. male with a hx of CAD, Dyslipidemia, HTN, S/P CABG and chronic venous insufficiency  last seen 06/08/2019. Compliance with diet, lifestyle and medications: Yes  [name] is just returned from a week at the beach he had Dunn dietary sodium intake did not take his diuretic he has a little bit of peripheral edema.  He remains active no angina dyspnea palpitation or syncope.  [facility] and notices his heart rate is Dunn rapid than it was in the past and I told him this is part of conditioning and training he should return to his previous baseline. Past Medical History:    Diagnosis Date  . Abnormal stress echocardiogram 08/16/2015   Overview:  With post exercise LV dilation  . Arthritis 04/14/2017  . Borderline anemia 05/2018   no medication prescribed at this time  . Chronic ischemic heart disease   . CKD (chronic kidney disease) 04/14/2017  . Coronary artery disease involving native coronary artery of native heart 04/14/2017  . Coronary artery disease with stable angina pectoris ([place]) 09/01/2015   pt denies chest pain  . DVT (deep venous thrombosis) (HCC)    Right leg  . Dyspnea 2017  . Essential hypertension 07/22/2015  . Gout 04/14/2017  . Hyperlipidemia 07/22/2015  . Incomplete right bundle branch block (RBBB)    Noted on EKG 01/31/2018  . 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 Leukocytosis 04/14/2017  . Nocturia   . Obesity 04/14/2017  . On amiodarone therapy 09/01/2015  . PAF (paroxysmal atrial fibrillation) ([place]) 09/01/2015   pt unaware  . Pleural effusion 09/22/2015   Overview:  Left, post CABG with US guided thoracentesis  . Premature ventricular contractions (PVCs) (VPCs) 04/14/2017  . S/P CABG (coronary artery bypass graft) 04/14/2017  . Skin cancer (melanoma) ([place]) 04/14/2017  . Urinary frequency   . Urinary urgency   . Venous insufficiency   . Vitamin B 12 deficiency     Past Surgical History:  Procedure Laterality Date  . CARDIAC CATHETERIZATION    . COLONOSCOPY    . CORNEAL  TRANSPLANT Bilateral   . CORONARY ARTERY BYPASS GRAFT  08/26/2015  . FOOT SURGERY Right   . TOTAL KNEE ARTHROPLASTY Right 08/14/2018   Procedure: TOTAL KNEE ARTHROPLASTY;  Surgeon: Ronald Huger, MD;  Location: [facility];  Service: Orthopedics;  Laterality: Right;    Current Medications: Current Meds  Medication Sig  . allopurinol (ZYLOPRIM) 100 MG tablet Take 100 mg by mouth 2 (two) times daily.   Marland Kitchen aspirin EC 81 MG tablet Take 81 mg by mouth daily.  Marland Kitchen ezetimibe (ZETIA) 10 MG tablet Take 1 tablet (10 mg total) by mouth daily.  . furosemide (LASIX) 20 MG tablet Take  20 mg by mouth daily as needed.  . loteprednol (LOTEMAX) 0.5 % ophthalmic suspension Place 1 drop into both eyes every morning.   . Multiple Vitamin (MULTIVITAMIN) capsule Take 1 capsule by mouth daily.   Marland Kitchen spironolactone (ALDACTONE) 25 MG tablet Take 1 tablet (25 mg total) by mouth daily.     Allergies:   Patient has no known allergies.   Social History   Socioeconomic History  . Marital status: Married    Spouse name: Not on file  . Number of children: Not on file  . Years of education: Not on file  . Highest education level: Not on file  Occupational History  . Not on file  Tobacco Use  . Smoking status: Never Smoker  . Smokeless tobacco: Never Used  Substance and Sexual Activity  . Alcohol use: Yes    Alcohol/week: 1.0 standard drinks    Types: 1 Glasses of wine per week    Comment: 5 days per week   . Drug use: No  . Sexual activity: Not on file  Other Topics Concern  . Not on file  Social History Narrative  . Not on file   Social Determinants of Health   Financial Resource Strain:   . Difficulty of Paying Living Expenses:   Food Insecurity:   . Worried About Charity fundraiser in the Last Year:   . Arboriculturist in the Last Year:   Transportation Needs:   . Film/video editor (Medical):   Marland Kitchen Lack of Transportation (Non-Medical):   Physical Activity:   . Days of Exercise per Week:   . Minutes of Exercise per Session:   Stress:   . Feeling of Stress :   Social Connections:   . Frequency of Communication with Friends and Family:   . Frequency of Social Gatherings with Friends and Family:   . Attends Religious Services:   . Active Member of Clubs or Organizations:   . Attends Archivist Meetings:   Marland Kitchen Marital Status:      Family History: The patient's family history includes Heart disease in his father; Heart failure in his father. ROS:   Please see the history of present illness.    All other systems reviewed and are  negative.  EKGs/Labs/Other Studies Reviewed:    The following studies were reviewed today:    Recent Labs: 06/08/2019: ALT 22; BUN 24; Creatinine, Ser 1.13; Potassium 4.6; Sodium 139  Recent Lipid Panel    Component Value Date/Time   CHOL 187 06/08/2019 1624   TRIG 94 06/08/2019 1624   HDL 75 06/08/2019 1624   CHOLHDL 2.5 06/08/2019 1624   LDLCALC 95 06/08/2019 1624    Physical Exam:    VS:  BP 124/78 (BP Location: Right Arm, Patient Position: Sitting, Cuff Size: Normal)   Pulse 82   Ht 5'  8" (1.727 m)   Wt 164 lb 6.4 oz (74.6 kg)   SpO2 96%   BMI 25.00 kg/m     Wt Readings from Last 3 Encounters:  12/21/19 164 lb 6.4 oz (74.6 kg)  06/08/19 160 lb 6.4 oz (72.8 kg)  11/09/18 165 lb (74.8 kg)     GEN:  Well nourished, well developed in no acute distress HEENT: Normal NECK: No JVD; No carotid bruits LYMPHATICS: No lymphadenopathy CARDIAC: RRR, no murmurs, rubs, gallops RESPIRATORY:  Clear to auscultation without rales, wheezing or rhonchi  ABDOMEN: Soft, non-tender, non-distended MUSCULOSKELETAL: 1-2+ dependent edema predominantly about the ankle bilateral edema; No deformity  SKIN: Warm and dry NEUROLOGIC:  Alert and oriented x 3 PSYCHIATRIC:  Normal affect    Signed, Ronald More, MD  12/21/2019 4:11 PM    [facility]

## 2019-12-22 LAB — COMPREHENSIVE METABOLIC PANEL
ALT: 18 IU/L (ref 0–44)
AST: 23 IU/L (ref 0–40)
Albumin/Globulin Ratio: 1.8 (ref 1.2–2.2)
Albumin: 4.4 g/dL (ref 3.6–4.6)
Alkaline Phosphatase: 75 IU/L (ref 48–121)
BUN/Creatinine Ratio: 18 (ref 10–24)
BUN: 24 mg/dL (ref 8–27)
Bilirubin Total: 0.6 mg/dL (ref 0.0–1.2)
CO2: 25 mmol/L (ref 20–29)
Calcium: 10 mg/dL (ref 8.6–10.2)
Chloride: 105 mmol/L (ref 96–106)
Creatinine, Ser: 1.32 mg/dL — ABNORMAL HIGH (ref 0.76–1.27)
GFR calc Af Amer: 56 mL/min/{1.73_m2} — ABNORMAL LOW (ref >59)
GFR calc non Af Amer: 49 mL/min/{1.73_m2} — ABNORMAL LOW (ref >59)
Globulin, Total: 2.5 g/dL (ref 1.5–4.5)
Glucose: 86 mg/dL (ref 65–99)
Potassium: 4.9 mmol/L (ref 3.5–5.2)
Sodium: 142 mmol/L (ref 134–144)
Total Protein: 6.9 g/dL (ref 6.0–8.5)

## 2019-12-22 LAB — LIPID PANEL
Chol/HDL Ratio: 2.6 ratio (ref 0.0–5.0)
Cholesterol, Total: 182 mg/dL (ref 100–199)
HDL: 70 mg/dL (ref >39)
LDL Chol Calc (NIH): 94 mg/dL (ref 0–99)
Triglycerides: 100 mg/dL (ref 0–149)
VLDL Cholesterol Cal: 18 mg/dL (ref 5–40)

## 2019-12-24 ENCOUNTER — Other Ambulatory Visit: Payer: Self-pay | Admitting: Cardiology

## 2019-12-25 ENCOUNTER — Telehealth: Payer: Self-pay

## 2019-12-25 NOTE — Telephone Encounter (Signed)
Spoke with patient regarding lab results and recommendation.  Patient verbalizes understanding and is agreeable to plan of care. Advised patient to call back with any issues or concerns.

## 2019-12-27 DIAGNOSIS — E538 Deficiency of other specified B group vitamins: Secondary | ICD-10-CM | POA: Diagnosis not present

## 2020-01-21 ENCOUNTER — Other Ambulatory Visit: Payer: Self-pay | Admitting: Cardiology

## 2020-01-21 DIAGNOSIS — I10 Essential (primary) hypertension: Secondary | ICD-10-CM

## 2020-01-23 DIAGNOSIS — E7800 Pure hypercholesterolemia, unspecified: Secondary | ICD-10-CM | POA: Diagnosis not present

## 2020-01-23 DIAGNOSIS — I10 Essential (primary) hypertension: Secondary | ICD-10-CM | POA: Diagnosis not present

## 2020-02-25 DIAGNOSIS — E538 Deficiency of other specified B group vitamins: Secondary | ICD-10-CM | POA: Diagnosis not present

## 2020-03-17 DIAGNOSIS — H1132 Conjunctival hemorrhage, left eye: Secondary | ICD-10-CM | POA: Diagnosis not present

## 2020-03-17 DIAGNOSIS — H02054 Trichiasis without entropian left upper eyelid: Secondary | ICD-10-CM | POA: Diagnosis not present

## 2020-03-20 DIAGNOSIS — E538 Deficiency of other specified B group vitamins: Secondary | ICD-10-CM | POA: Diagnosis not present

## 2020-03-25 DIAGNOSIS — M1990 Unspecified osteoarthritis, unspecified site: Secondary | ICD-10-CM | POA: Diagnosis not present

## 2020-03-25 DIAGNOSIS — I10 Essential (primary) hypertension: Secondary | ICD-10-CM | POA: Diagnosis not present

## 2020-03-25 DIAGNOSIS — I4892 Unspecified atrial flutter: Secondary | ICD-10-CM | POA: Diagnosis not present

## 2020-04-01 ENCOUNTER — Other Ambulatory Visit: Payer: Self-pay | Admitting: Cardiology

## 2020-04-24 DIAGNOSIS — I4892 Unspecified atrial flutter: Secondary | ICD-10-CM | POA: Diagnosis not present

## 2020-04-24 DIAGNOSIS — M1990 Unspecified osteoarthritis, unspecified site: Secondary | ICD-10-CM | POA: Diagnosis not present

## 2020-04-24 DIAGNOSIS — I10 Essential (primary) hypertension: Secondary | ICD-10-CM | POA: Diagnosis not present

## 2020-04-30 DIAGNOSIS — E538 Deficiency of other specified B group vitamins: Secondary | ICD-10-CM | POA: Diagnosis not present

## 2020-04-30 DIAGNOSIS — Z23 Encounter for immunization: Secondary | ICD-10-CM | POA: Diagnosis not present

## 2020-05-21 DIAGNOSIS — H43813 Vitreous degeneration, bilateral: Secondary | ICD-10-CM | POA: Diagnosis not present

## 2020-05-28 DIAGNOSIS — E538 Deficiency of other specified B group vitamins: Secondary | ICD-10-CM | POA: Diagnosis not present

## 2020-06-22 NOTE — Progress Notes (Signed)
Cardiology Office Note:    Date:  06/23/2020   ID:  [name], DOB 08-17-1931, MRN [id number]  PCP:  Angelina Sheriff, MD  Cardiologist:  Shirlee More, MD    Referring MD: Angelina Sheriff, MD    ASSESSMENT:    1. Tachycardia   2. Right bundle branch block (RBBB) with left anterior hemiblock   3. Coronary artery disease of native artery of native heart with stable angina pectoris ([place])   4. Essential hypertension   5. Hyperlipidemia, unspecified hyperlipidemia type    PLAN:    In order of problems listed above:  1. 7-day ZIO monitor to assess for both tachycardia and bradycardia arrhythmia. 2. Stable CAD continue treatment aspirin lipid-lowering Zetia 3. Stable BP at target continue current treatment diuretics 4. Continue Zetia for lipid profile   Next appointment: 6 months   Medication Adjustments/Labs and Tests Ordered: Current medicines are reviewed at length with the patient today.  Concerns regarding medicines are outlined above.  No orders of the defined types were placed in this encounter.  No orders of the defined types were placed in this encounter.   No chief complaint on file.   History of Present Illness:    Ronald Dunn is a 86 y.o. male with a hx of CAD with CABG 08/08/2015, hypertension dyslipidemia and chronic venous insufficiency last seen 12/01/2019. Compliance with diet, lifestyle and medications: Yes  Remains quite active and has no exercise intolerance dyspnea chest pain palpitation or syncope.  [facility] heart rate will drop up into the range of 148 bpm this is happened on different machines at different times and he questions why.'s EKG shows the same pattern bifascicular heart block but he now has first-degree AV block.  We will apply a 7-day ZIO monitor to assess both for bradycardia and tachyarrhythmia.  He has had no angina dyspnea palpitation or syncope.  He tolerates nonlipid-lowering therapy  without muscle pain or weakness Past Medical History:  Diagnosis Date  . Abnormal stress echocardiogram 08/16/2015   Overview:  With post exercise LV dilation  . Arthritis 04/14/2017  . Borderline anemia 05/2018   no medication prescribed at this time  . Chronic ischemic heart disease   . CKD (chronic kidney disease) 04/14/2017  . Coronary artery disease involving native coronary artery of native heart 04/14/2017  . Coronary artery disease with stable angina pectoris ([place]) 09/01/2015   pt denies chest pain  . DVT (deep venous thrombosis) (H[facility])    Right leg  . Dyspnea 2017  . Essential hypertension 07/22/2015  . Gout 04/14/2017  . Hyperlipidemia 07/22/2015  . Incomplete right bundle branch block (RBBB)    Noted on EKG 01/31/2018  . 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 Leukocytosis 04/14/2017  . Nocturia   . Obesity 04/14/2017  . On amiodarone therapy 09/01/2015  . PAF (paroxysmal atrial fibrillation) ([place]) 09/01/2015   pt unaware  . Pleural effusion 09/22/2015   Overview:  Left, post CABG with US guided thoracentesis  . Premature ventricular contractions (PVCs) (VPCs) 04/14/2017  . S/P CABG (coronary artery bypass graft) 04/14/2017  . Skin cancer (melanoma) ([place]) 04/14/2017  . Urinary frequency   . Urinary urgency   . Venous insufficiency   . Vitamin B 12 deficiency     Past Surgical History:  Procedure Laterality Date  . CARDIAC CATHETERIZATION    . COLONOSCOPY    . CORNEAL TRANSPLANT Bilateral   . CORONARY ARTERY BYPASS  GRAFT  08/26/2015  . FOOT SURGERY Right   . TOTAL KNEE ARTHROPLASTY Right 08/14/2018   Procedure: TOTAL KNEE ARTHROPLASTY;  Surgeon: Vickey Huger, MD;  Location: [facility];  Service: Orthopedics;  Laterality: Right;    Current Medications: Current Meds  Medication Sig  . allopurinol (ZYLOPRIM) 100 MG tablet Take 100 mg by mouth 2 (two) times daily.   Marland Kitchen aspirin EC 81 MG tablet Take 81 mg by mouth daily.  Marland Kitchen ezetimibe (ZETIA) 10 MG tablet Take 1 tablet by mouth once  daily  . furosemide (LASIX) 20 MG tablet Take 20 mg by mouth daily as needed.  . loteprednol (LOTEMAX) 0.5 % ophthalmic suspension Place 1 drop into both eyes every morning.   . Multiple Vitamin (MULTIVITAMIN) capsule Take 1 capsule by mouth daily.   Marland Kitchen spironolactone (ALDACTONE) 25 MG tablet Take 1 tablet by mouth once daily     Allergies:   Patient has no known allergies.   Social History   Socioeconomic History  . Marital status: Married    Spouse name: Not on file  . Number of children: Not on file  . Years of education: Not on file  . Highest education level: Not on file  Occupational History  . Not on file  Tobacco Use  . Smoking status: Never Smoker  . Smokeless tobacco: Never Used  Vaping Use  . Vaping Use: Never used  Substance and Sexual Activity  . Alcohol use: Yes    Alcohol/week: 1.0 standard drink    Types: 1 Glasses of wine per week    Comment: 5 days per week   . Drug use: No  . Sexual activity: Not on file  Other Topics Concern  . Not on file  Social History Narrative  . Not on file   Social Determinants of Health   Financial Resource Strain:   . Difficulty of Paying Living Expenses: Not on file  Food Insecurity:   . Worried About Charity fundraiser in the Last Year: Not on file  . Ran Out of Food in the Last Year: Not on file  Transportation Needs:   . Lack of Transportation (Medical): Not on file  . Lack of Transportation (Non-Medical): Not on file  Physical Activity:   . Days of Exercise per Week: Not on file  . Minutes of Exercise per Session: Not on file  Stress:   . Feeling of Stress : Not on file  Social Connections:   . Frequency of Communication with Friends and Family: Not on file  . Frequency of Social Gatherings with Friends and Family: Not on file  . Attends Religious Services: Not on file  . Active Member of Clubs or Organizations: Not on file  . Attends Archivist Meetings: Not on file  . Marital Status: Not on file      Family History: The patient's family history includes Heart disease in his father; Heart failure in his father. ROS:   Please see the history of present illness.    All other systems reviewed and are negative.  EKGs/Labs/Other Studies Reviewed:    The following studies were reviewed today:  EKG:  EKG ordered today and personally reviewed.  The ekg ordered today demonstrates bundle branch block left anterior hemiblock first-degree AV block  Recent Labs: 12/21/2019: ALT 18; BUN 24; Creatinine, Ser 1.32; Potassium 4.9; Sodium 142  Recent Lipid Panel    Component Value Date/Time   CHOL 182 12/21/2019 1548   TRIG 100 12/21/2019 1548  HDL 70 12/21/2019 1548   CHOLHDL 2.6 12/21/2019 1548   LDLCALC 94 12/21/2019 1548    Physical Exam:    VS:  BP 127/70   Pulse (!) 58   Ht 5\' 8"  (1.727 m)   Wt 166 lb (75.3 kg)   SpO2 95%   BMI 25.24 kg/m     Wt Readings from Last 3 Encounters:  06/23/20 166 lb (75.3 kg)  12/21/19 164 lb 6.4 oz (74.6 kg)  06/08/19 160 lb 6.4 oz (72.8 kg)     GEN:  Well nourished, well developed in no acute distress HEENT: Normal NECK: No JVD; No carotid bruits LYMPHATICS: No lymphadenopathy CARDIAC: RRR, no murmurs, rubs, gallops RESPIRATORY:  Clear to auscultation without rales, wheezing or rhonchi  ABDOMEN: Soft, non-tender, non-distended MUSCULOSKELETAL:  No edema; No deformity  SKIN: Warm and dry NEUROLOGIC:  Alert and oriented x 3 PSYCHIATRIC:  Normal affect    Signed, Shirlee More, MD  06/23/2020 9:38 AM    [place]

## 2020-06-23 ENCOUNTER — Other Ambulatory Visit: Payer: Self-pay

## 2020-06-23 ENCOUNTER — Encounter: Payer: Self-pay | Admitting: Cardiology

## 2020-06-23 ENCOUNTER — Ambulatory Visit (INDEPENDENT_AMBULATORY_CARE_PROVIDER_SITE_OTHER): Payer: Medicare Other | Admitting: Cardiology

## 2020-06-23 ENCOUNTER — Ambulatory Visit (INDEPENDENT_AMBULATORY_CARE_PROVIDER_SITE_OTHER): Payer: Medicare Other

## 2020-06-23 VITALS — BP 127/70 | HR 58 | Ht 68.0 in | Wt 166.0 lb

## 2020-06-23 DIAGNOSIS — I25118 Atherosclerotic heart disease of native coronary artery with other forms of angina pectoris: Secondary | ICD-10-CM

## 2020-06-23 DIAGNOSIS — I10 Essential (primary) hypertension: Secondary | ICD-10-CM

## 2020-06-23 DIAGNOSIS — R000 Tachycardia, unspecified: Secondary | ICD-10-CM | POA: Diagnosis not present

## 2020-06-23 DIAGNOSIS — E785 Hyperlipidemia, unspecified: Secondary | ICD-10-CM

## 2020-06-23 DIAGNOSIS — I452 Bifascicular block: Secondary | ICD-10-CM

## 2020-06-23 LAB — COMPREHENSIVE METABOLIC PANEL
ALT: 19 IU/L (ref 0–44)
AST: 24 IU/L (ref 0–40)
Albumin/Globulin Ratio: 1.5 (ref 1.2–2.2)
Albumin: 4.1 g/dL (ref 3.6–4.6)
Alkaline Phosphatase: 82 IU/L (ref 44–121)
BUN/Creatinine Ratio: 15 (ref 10–24)
BUN: 19 mg/dL (ref 8–27)
Bilirubin Total: 0.4 mg/dL (ref 0.0–1.2)
CO2: 24 mmol/L (ref 20–29)
Calcium: 9.6 mg/dL (ref 8.6–10.2)
Chloride: 104 mmol/L (ref 96–106)
Creatinine, Ser: 1.23 mg/dL (ref 0.76–1.27)
GFR calc Af Amer: 61 mL/min/{1.73_m2} (ref >59)
GFR calc non Af Amer: 53 mL/min/{1.73_m2} — ABNORMAL LOW (ref >59)
Globulin, Total: 2.8 g/dL (ref 1.5–4.5)
Glucose: 91 mg/dL (ref 65–99)
Potassium: 5.2 mmol/L (ref 3.5–5.2)
Sodium: 138 mmol/L (ref 134–144)
Total Protein: 6.9 g/dL (ref 6.0–8.5)

## 2020-06-23 LAB — LIPID PANEL
Chol/HDL Ratio: 2.5 ratio (ref 0.0–5.0)
Cholesterol, Total: 176 mg/dL (ref 100–199)
HDL: 70 mg/dL (ref >39)
LDL Chol Calc (NIH): 86 mg/dL (ref 0–99)
Triglycerides: 112 mg/dL (ref 0–149)
VLDL Cholesterol Cal: 20 mg/dL (ref 5–40)

## 2020-06-23 NOTE — Addendum Note (Signed)
Addended by: Resa Miner I on: 06/23/2020 09:58 AM   Modules accepted: Orders

## 2020-06-23 NOTE — Patient Instructions (Signed)
Medication Instructions:  Your physician recommends that you continue on your current medications as directed. Please refer to the Current Medication list given to you today.  *If you need a refill on your cardiac medications before your next appointment, please call your pharmacy*   Lab Work: Your physician recommends that you return for lab work in: [place], Lipids If you have labs (blood work) drawn today and your tests are completely normal, you will receive your results only by: Marland Kitchen MyChart Message (if you have MyChart) OR . A paper copy in the mail If you have any lab test that is abnormal or we need to change your treatment, we will call you to review the results.   Testing/Procedures: A zio monitor was ordered today. It will remain on for 7 days. You will then return monitor and event diary in provided box. It takes 1-2 weeks for report to be downloaded and returned to [place]. We will call you with the results. If monitor falls off or has orange flashing light, please call Zio for further instructions.      Follow-Up: At [facility], you and your health needs are our priority.  As part of our continuing mission to provide you with exceptional heart care, we have created designated Provider Care Teams.  These Care Teams include your primary Cardiologist (physician) and Advanced Practice Providers (APPs -  Physician Assistants and Nurse Practitioners) who all work together to provide you with the care you need, when you need it.  We recommend signing up for the patient portal called "MyChart".  Sign up information is provided on this After Visit Summary.  MyChart is used to connect with patients for Virtual Visits (Telemedicine).  Patients are able to view lab/test results, encounter notes, upcoming appointments, etc.  Non-urgent messages can be sent to your provider as well.   To learn more about what you can do with MyChart, go to [email or web address].    Your next appointment:    6 month(s)  The format for your next appointment:   In Person  Provider:   Shirlee More, MD   Other Instructions

## 2020-06-24 ENCOUNTER — Telehealth: Payer: Self-pay

## 2020-06-24 DIAGNOSIS — M25461 Effusion, right knee: Secondary | ICD-10-CM | POA: Diagnosis not present

## 2020-06-24 DIAGNOSIS — E538 Deficiency of other specified B group vitamins: Secondary | ICD-10-CM | POA: Diagnosis not present

## 2020-06-24 DIAGNOSIS — Z96651 Presence of right artificial knee joint: Secondary | ICD-10-CM | POA: Diagnosis not present

## 2020-06-24 DIAGNOSIS — Z471 Aftercare following joint replacement surgery: Secondary | ICD-10-CM | POA: Diagnosis not present

## 2020-06-24 DIAGNOSIS — I739 Peripheral vascular disease, unspecified: Secondary | ICD-10-CM | POA: Diagnosis not present

## 2020-06-24 NOTE — Telephone Encounter (Signed)
Per Dr. Bettina Gavia:  Good result no changes in treatment    Spoke with patient regarding results and recommendation.  Patient verbalizes understanding and is agreeable to plan of care. Advised patient to call back with any issues or concerns.

## 2020-07-10 ENCOUNTER — Telehealth: Payer: Self-pay | Admitting: Cardiology

## 2020-07-10 NOTE — Telephone Encounter (Signed)
Patient called to see if Dr. Bettina Gavia had gotten the results from his heart monitor yet

## 2020-07-10 NOTE — Telephone Encounter (Signed)
Advised that result not uploaded in chart yet but message would be sent to nurse to check on status

## 2020-07-11 DIAGNOSIS — R000 Tachycardia, unspecified: Secondary | ICD-10-CM | POA: Diagnosis not present

## 2020-07-15 ENCOUNTER — Telehealth: Payer: Self-pay | Admitting: Cardiology

## 2020-07-15 NOTE — Telephone Encounter (Signed)
Patient is calling to speak to Dr.Munleys nurse about his results. Patient states that he can see his results on my chart and needs clarification. Please advise.

## 2020-07-15 NOTE — Telephone Encounter (Signed)
Forwarding to MD/RN

## 2020-07-15 NOTE — Telephone Encounter (Signed)
I sent a result note earlier he is having episodes of rapid heart rhythm I advised him to be seen by Dr. Curt Bears and this was probably around 12 noon today.

## 2020-07-17 NOTE — Telephone Encounter (Signed)
See result note on monitor report

## 2020-07-31 DIAGNOSIS — E538 Deficiency of other specified B group vitamins: Secondary | ICD-10-CM | POA: Diagnosis not present

## 2020-08-11 ENCOUNTER — Telehealth: Payer: Medicare Other | Admitting: Cardiology

## 2020-08-19 DIAGNOSIS — M79604 Pain in right leg: Secondary | ICD-10-CM | POA: Diagnosis not present

## 2020-08-19 DIAGNOSIS — M160 Bilateral primary osteoarthritis of hip: Secondary | ICD-10-CM | POA: Diagnosis not present

## 2020-08-19 DIAGNOSIS — M47816 Spondylosis without myelopathy or radiculopathy, lumbar region: Secondary | ICD-10-CM | POA: Diagnosis not present

## 2020-08-19 DIAGNOSIS — M545 Low back pain, unspecified: Secondary | ICD-10-CM | POA: Diagnosis not present

## 2020-08-19 DIAGNOSIS — Z96651 Presence of right artificial knee joint: Secondary | ICD-10-CM | POA: Diagnosis not present

## 2020-08-20 NOTE — Progress Notes (Signed)
Electrophysiology Office Note   Date:  08/21/2020   ID:  Montrail, Mehrer 1932-01-23, MRN [id number]  PCP:  Angelina Sheriff, MD  Cardiologist: Bettina Gavia Primary Electrophysiologist:  Desma Wilkowski Meredith Leeds, MD    Chief Complaint: Tachycardia   History of Present Illness: Ronald Dunn is a 87 y.o. male who is being seen today for the evaluation of tachycardia at the request of Angelina Sheriff, MD. Presenting today for electrophysiology evaluation.  He has a past history significant for CKD, coronary artery disease status post CABG, hypertension, hyperlipidemia, paroxysmal atrial fibrillation.  Today, he denies symptoms of palpitations, chest pain, shortness of breath, orthopnea, PND, lower extremity edema, claudication, dizziness, presyncope, syncope, bleeding, or neurologic sequela. The patient is tolerating medications without difficulties.  He was at the gym working on the elliptical machine.  He noted that his heart rates would jump into the 140s at times.  Usually his heart rates are in the 120s.  He is currently able to do all of his daily activities without restriction.  He wore a cardiac monitor that showed episodes of SVT.  He was unaware of his SVT and only knew about this as he was on the elliptical using the heart rate monitor.   Past Medical History:  Diagnosis Date  . Abnormal stress echocardiogram 08/16/2015   Overview:  With post exercise LV dilation  . Arthritis 04/14/2017  . Borderline anemia 05/2018   no medication prescribed at this time  . Chronic ischemic heart disease   . CKD (chronic kidney disease) 04/14/2017  . Coronary artery disease involving native coronary artery of native heart 04/14/2017  . Coronary artery disease with stable angina pectoris ([place]) 09/01/2015   pt denies chest pain  . DVT (deep venous thrombosis) (HCC)    Right leg  . Dyspnea 2017  . Essential hypertension 07/22/2015  . Gout 04/14/2017  . Hyperlipidemia 07/22/2015  .  Incomplete right bundle branch block (RBBB)    Noted on EKG 01/31/2018  . 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 Leukocytosis 04/14/2017  . Nocturia   . Obesity 04/14/2017  . On amiodarone therapy 09/01/2015  . PAF (paroxysmal atrial fibrillation) ([place]) 09/01/2015   pt unaware  . Pleural effusion 09/22/2015   Overview:  Left, post CABG with US guided thoracentesis  . Premature ventricular contractions (PVCs) (VPCs) 04/14/2017  . S/P CABG (coronary artery bypass graft) 04/14/2017  . Skin cancer (melanoma) ([place]) 04/14/2017  . Urinary frequency   . Urinary urgency   . Venous insufficiency   . Vitamin B 12 deficiency    Past Surgical History:  Procedure Laterality Date  . CARDIAC CATHETERIZATION    . COLONOSCOPY    . CORNEAL TRANSPLANT Bilateral   . CORONARY ARTERY BYPASS GRAFT  08/26/2015  . FOOT SURGERY Right   . TOTAL KNEE ARTHROPLASTY Right 08/14/2018   Procedure: TOTAL KNEE ARTHROPLASTY;  Surgeon: Vickey Huger, MD;  Location: [facility];  Service: Orthopedics;  Laterality: Right;     Current Outpatient Medications  Medication Sig Dispense Refill  . allopurinol (ZYLOPRIM) 100 MG tablet Take 100 mg by mouth 2 (two) times daily.     Marland Kitchen aspirin EC 81 MG tablet Take 81 mg by mouth daily.    Marland Kitchen ezetimibe (ZETIA) 10 MG tablet Take 1 tablet by mouth once daily 90 tablet 1  . furosemide (LASIX) 20 MG tablet Take 20 mg by mouth daily as needed.    . loteprednol (LOTEMAX) 0.5 % ophthalmic suspension Place  1 drop into both eyes every morning.     . Multiple Vitamin (MULTIVITAMIN) capsule Take 1 capsule by mouth daily.     Marland Kitchen spironolactone (ALDACTONE) 25 MG tablet Take 1 tablet by mouth once daily 90 tablet 2   No current facility-administered medications for this visit.    Allergies:   Patient has no known allergies.   Social History:  The patient  reports that he has never smoked. He has never used smokeless tobacco. He reports current alcohol use of about 1.0 standard drink of alcohol per week. He  reports that he does not use drugs.   Family History:  The patient's family history includes Heart disease in his father; Heart failure in his father.    ROS:  Please see the history of present illness.   Otherwise, review of systems is positive for none.   All other systems are reviewed and negative.    PHYSICAL EXAM: VS:  BP 140/82   Pulse 100   Ht 5\' 8"  (1.727 m)   Wt 167 lb 9.6 oz (76 kg)   SpO2 98%   BMI 25.48 kg/m  , BMI Body mass index is 25.48 kg/m. GEN: Well nourished, well developed, in no acute distress  HEENT: normal  Neck: no JVD, carotid bruits, or masses Cardiac: Regular, mildly tachycardic; no murmurs, rubs, or gallops,no edema  Respiratory:  clear to auscultation bilaterally, normal work of breathing GI: soft, nontender, nondistended, + BS MS: no deformity or atrophy  Skin: warm and dry Neuro:  Strength and sensation are intact Psych: euthymic mood, full affect  EKG:  EKG is ordered today. Personal review of the ekg ordered shows sinus tachycardia, right bundle branch block  Recent Labs: 06/23/2020: ALT 19; BUN 19; Creatinine, Ser 1.23; Potassium 5.2; Sodium 138    Lipid Panel     Component Value Date/Time   CHOL 176 06/23/2020 1008   TRIG 112 06/23/2020 1008   HDL 70 06/23/2020 1008   CHOLHDL 2.5 06/23/2020 1008   LDLCALC 86 06/23/2020 1008     Wt Readings from Last 3 Encounters:  08/21/20 167 lb 9.6 oz (76 kg)  06/23/20 166 lb (75.3 kg)  12/21/19 164 lb 6.4 oz (74.6 kg)      Other studies Reviewed: Additional studies/ records that were reviewed today include: Cardiac monitor 07/14/2020 personally reviewed Review of the above records today demonstrates:  Occasional PVCs with couplets and triplets, occasional supraventricular ectopy with frequent episodes of atrial tachycardia.    ASSESSMENT AND PLAN:  1.  SVT: Has had multiple episodes of SVT.  Fortunately the patient is unaware of this.  He Alexxa Sabet continue to monitor his symptoms and if  he has further symptoms, Aaro Meyers potentially start him on medications.  I did also discuss with him the possibility of ablation.  We Teddie Mehta see him back in 3 months for further discussion.  2.  Coronary artery disease status post CABG: No current chest pain.  Plan per primary cardiology.  3.  Hypertension: Mildly elevated.  Plan per primary cardiology.  Case discussed with primary cardiology  Current medicines are reviewed at length with the patient today.   The patient does not have concerns regarding his medicines.  The following changes were made today:  none  Labs/ tests ordered today include:  Orders Placed This Encounter  Procedures  . EKG 12-Lead     Disposition:   FU with Shreena Baines 3 months  Signed, Leshea Jaggers Meredith Leeds, MD  08/21/2020 12:02 PM  [place] [address] [phone] (office) [phone] (fax)

## 2020-08-21 ENCOUNTER — Other Ambulatory Visit: Payer: Self-pay

## 2020-08-21 ENCOUNTER — Encounter: Payer: Self-pay | Admitting: Cardiology

## 2020-08-21 ENCOUNTER — Ambulatory Visit (INDEPENDENT_AMBULATORY_CARE_PROVIDER_SITE_OTHER): Payer: Medicare Other | Admitting: Cardiology

## 2020-08-21 VITALS — BP 140/82 | HR 100 | Ht 68.0 in | Wt 167.6 lb

## 2020-08-21 DIAGNOSIS — I471 Supraventricular tachycardia: Secondary | ICD-10-CM | POA: Diagnosis not present

## 2020-08-21 NOTE — Patient Instructions (Signed)
.  Medication Instructions:  Your physician recommends that you continue on your current medications as directed. Please refer to the Current Medication list given to you today.  *If you need a refill on your cardiac medications before your next appointment, please call your pharmacy*   Lab Work: None ordered  Testing/Procedures: None ordered   Follow-Up: At [facility], you and your health needs are our priority.  As part of our continuing mission to provide you with exceptional heart care, we have created designated Provider Care Teams.  These Care Teams include your primary Cardiologist (physician) and Advanced Practice Providers (APPs -  Physician Assistants and Nurse Practitioners) who all work together to provide you with the care you need, when you need it.  Your next appointment:   3 month(s) in [place]  The format for your next appointment:   In Person  Provider:   Allegra Lai, MD    Thank you for choosing [facility]!!   Trinidad Curet, RN [phone]

## 2020-08-27 DIAGNOSIS — M1A09X0 Idiopathic chronic gout, multiple sites, without tophus (tophi): Secondary | ICD-10-CM | POA: Diagnosis not present

## 2020-08-27 DIAGNOSIS — N1831 Chronic kidney disease, stage 3a: Secondary | ICD-10-CM | POA: Diagnosis not present

## 2020-08-27 DIAGNOSIS — Z6823 Body mass index (BMI) 23.0-23.9, adult: Secondary | ICD-10-CM | POA: Diagnosis not present

## 2020-08-27 DIAGNOSIS — M150 Primary generalized (osteo)arthritis: Secondary | ICD-10-CM | POA: Diagnosis not present

## 2020-08-28 DIAGNOSIS — E538 Deficiency of other specified B group vitamins: Secondary | ICD-10-CM | POA: Diagnosis not present

## 2020-09-03 DIAGNOSIS — Z6823 Body mass index (BMI) 23.0-23.9, adult: Secondary | ICD-10-CM | POA: Diagnosis not present

## 2020-09-03 DIAGNOSIS — M5431 Sciatica, right side: Secondary | ICD-10-CM | POA: Diagnosis not present

## 2020-09-03 DIAGNOSIS — M1990 Unspecified osteoarthritis, unspecified site: Secondary | ICD-10-CM | POA: Diagnosis not present

## 2020-09-03 DIAGNOSIS — I4892 Unspecified atrial flutter: Secondary | ICD-10-CM | POA: Diagnosis not present

## 2020-09-04 DIAGNOSIS — M1380 Other specified arthritis, unspecified site: Secondary | ICD-10-CM | POA: Diagnosis not present

## 2020-09-04 DIAGNOSIS — R2689 Other abnormalities of gait and mobility: Secondary | ICD-10-CM | POA: Diagnosis not present

## 2020-09-04 DIAGNOSIS — M25561 Pain in right knee: Secondary | ICD-10-CM | POA: Diagnosis not present

## 2020-09-04 DIAGNOSIS — M5431 Sciatica, right side: Secondary | ICD-10-CM | POA: Diagnosis not present

## 2020-09-04 DIAGNOSIS — Z96651 Presence of right artificial knee joint: Secondary | ICD-10-CM | POA: Diagnosis not present

## 2020-09-08 DIAGNOSIS — R2689 Other abnormalities of gait and mobility: Secondary | ICD-10-CM | POA: Diagnosis not present

## 2020-09-08 DIAGNOSIS — M1380 Other specified arthritis, unspecified site: Secondary | ICD-10-CM | POA: Diagnosis not present

## 2020-09-08 DIAGNOSIS — M25561 Pain in right knee: Secondary | ICD-10-CM | POA: Diagnosis not present

## 2020-09-08 DIAGNOSIS — M5431 Sciatica, right side: Secondary | ICD-10-CM | POA: Diagnosis not present

## 2020-09-08 DIAGNOSIS — Z96651 Presence of right artificial knee joint: Secondary | ICD-10-CM | POA: Diagnosis not present

## 2020-09-10 DIAGNOSIS — R2689 Other abnormalities of gait and mobility: Secondary | ICD-10-CM | POA: Diagnosis not present

## 2020-09-10 DIAGNOSIS — Z96651 Presence of right artificial knee joint: Secondary | ICD-10-CM | POA: Diagnosis not present

## 2020-09-10 DIAGNOSIS — M1380 Other specified arthritis, unspecified site: Secondary | ICD-10-CM | POA: Diagnosis not present

## 2020-09-10 DIAGNOSIS — M25561 Pain in right knee: Secondary | ICD-10-CM | POA: Diagnosis not present

## 2020-09-10 DIAGNOSIS — M5431 Sciatica, right side: Secondary | ICD-10-CM | POA: Diagnosis not present

## 2020-09-12 DIAGNOSIS — R2689 Other abnormalities of gait and mobility: Secondary | ICD-10-CM | POA: Diagnosis not present

## 2020-09-12 DIAGNOSIS — M25561 Pain in right knee: Secondary | ICD-10-CM | POA: Diagnosis not present

## 2020-09-12 DIAGNOSIS — M5431 Sciatica, right side: Secondary | ICD-10-CM | POA: Diagnosis not present

## 2020-09-12 DIAGNOSIS — Z96651 Presence of right artificial knee joint: Secondary | ICD-10-CM | POA: Diagnosis not present

## 2020-09-12 DIAGNOSIS — M1380 Other specified arthritis, unspecified site: Secondary | ICD-10-CM | POA: Diagnosis not present

## 2020-09-18 DIAGNOSIS — M5431 Sciatica, right side: Secondary | ICD-10-CM | POA: Diagnosis not present

## 2020-09-18 DIAGNOSIS — Z6823 Body mass index (BMI) 23.0-23.9, adult: Secondary | ICD-10-CM | POA: Diagnosis not present

## 2020-09-18 DIAGNOSIS — M25561 Pain in right knee: Secondary | ICD-10-CM | POA: Diagnosis not present

## 2020-09-25 DIAGNOSIS — M4316 Spondylolisthesis, lumbar region: Secondary | ICD-10-CM | POA: Diagnosis not present

## 2020-09-25 DIAGNOSIS — M25561 Pain in right knee: Secondary | ICD-10-CM | POA: Diagnosis not present

## 2020-09-25 DIAGNOSIS — M47816 Spondylosis without myelopathy or radiculopathy, lumbar region: Secondary | ICD-10-CM | POA: Diagnosis not present

## 2020-09-25 DIAGNOSIS — M1611 Unilateral primary osteoarthritis, right hip: Secondary | ICD-10-CM | POA: Diagnosis not present

## 2020-09-26 DIAGNOSIS — I872 Venous insufficiency (chronic) (peripheral): Secondary | ICD-10-CM | POA: Diagnosis not present

## 2020-09-26 DIAGNOSIS — M7989 Other specified soft tissue disorders: Secondary | ICD-10-CM | POA: Diagnosis not present

## 2020-09-26 DIAGNOSIS — M79604 Pain in right leg: Secondary | ICD-10-CM | POA: Diagnosis not present

## 2020-09-26 DIAGNOSIS — M7122 Synovial cyst of popliteal space [Baker], left knee: Secondary | ICD-10-CM | POA: Diagnosis not present

## 2020-09-30 ENCOUNTER — Telehealth: Payer: Self-pay

## 2020-09-30 DIAGNOSIS — M4316 Spondylolisthesis, lumbar region: Secondary | ICD-10-CM | POA: Diagnosis not present

## 2020-09-30 DIAGNOSIS — M1611 Unilateral primary osteoarthritis, right hip: Secondary | ICD-10-CM | POA: Diagnosis not present

## 2020-09-30 DIAGNOSIS — Z96651 Presence of right artificial knee joint: Secondary | ICD-10-CM | POA: Diagnosis not present

## 2020-09-30 DIAGNOSIS — M25861 Other specified joint disorders, right knee: Secondary | ICD-10-CM | POA: Insufficient documentation

## 2020-09-30 NOTE — Telephone Encounter (Signed)
Dr. Bettina Gavia Pt had CABG in 2017. May he hold ASA for knee surgery?

## 2020-09-30 NOTE — Telephone Encounter (Signed)
I agree with you Ronald Dunn is well-known to me has chronic stable angina and he can withdraw his antiplatelet therapy a week prior to surgery and generally resume 2 days afterwards.

## 2020-09-30 NOTE — Telephone Encounter (Signed)
   [facility] Pre-operative Risk Assessment    HEARTCARE STAFF: - Please ensure there is not already an duplicate clearance open for this procedure. - Under Visit Info/Reason for Call, type in Other and utilize the format Clearance MM/DD/YY or Clearance TBD. Do not use dashes or single digits. - If request is for dental extraction, please clarify the # of teeth to be extracted.  Request for surgical clearance:  1. What type of surgery is being performed? Right knee diagnostic scope for patellar clunk   2. When is this surgery scheduled?  10/08/2020   3. What type of clearance is required (medical clearance vs. Pharmacy clearance to hold med vs. Both)? Both  4. Are there any medications that need to be held prior to surgery and how long?None specified   5. Practice name and name of physician performing surgery? [facility].    6. What is the office phone number? [phone]   7.   What is the office fax number? [phone]  8.   Anesthesia type (None, local, MAC, general) ? General   Gita Kudo 09/30/2020, 1:25 PM  _________________________________________________________________   (provider comments below)

## 2020-09-30 NOTE — Telephone Encounter (Addendum)
   Primary Cardiologist: Shirlee More, MD  Chart reviewed as part of pre-operative protocol coverage. Patient was contacted 09/30/2020 in reference to pre-operative risk assessment for pending surgery as outlined below.  Ronald Dunn was last seen on 06/23/20 by Dr. Bettina Gavia.  Since that day, Ronald Dunn has done well.  He can complete more than 4.0 METS without angina. He used to exercise on the elliptical but is not limited by knee pain. He has a history of CABG and is on ASA. He may hold ASA 5-7 days for procedure, resume after as soon as safe to do so.  Therefore, based on ACC/AHA guidelines, the patient would be at acceptable risk for the planned procedure without further cardiovascular testing.   The patient was advised that if he develops new symptoms prior to surgery to contact our office to arrange for a follow-up visit, and he verbalized understanding.  I will route this recommendation to the requesting party via Epic fax function and remove from pre-op pool. Please call with questions.  Ledora Bottcher, PA 09/30/2020, 4:27 PM

## 2020-10-02 DIAGNOSIS — E538 Deficiency of other specified B group vitamins: Secondary | ICD-10-CM | POA: Diagnosis not present

## 2020-10-08 ENCOUNTER — Telehealth: Payer: Self-pay | Admitting: Cardiology

## 2020-10-08 DIAGNOSIS — M6751 Plica syndrome, right knee: Secondary | ICD-10-CM | POA: Diagnosis not present

## 2020-10-08 NOTE — Telephone Encounter (Signed)
Pt c/o medication issue:  1. Name of Medication: furosemide (LASIX) 20 MG tablet  2. How are you currently taking this medication (dosage and times per day)? 1-2 tablet weekly  3. Are you having a reaction (difficulty breathing--STAT)? Fluid is not going down   4. What is your medication issue? Patient called to say that he has concern b/c the fluid is not decreasing like it should. Would like to know if needs to increase dosage of tablet his is taking. Patient also discuss that he had procedure done today for his knee and the dr prescribe in the Lasix so he is wondering how he need to take this medicine with the furosemid. Please advise

## 2020-10-08 NOTE — Telephone Encounter (Signed)
He can take the Lasix 20 mg daily if he would like and then take as needed.

## 2020-10-08 NOTE — Telephone Encounter (Signed)
Left message for patient to return call.

## 2020-10-08 NOTE — Telephone Encounter (Signed)
Called patient. He reports he has had leg swelling for the past two weeks. The left leg is the worst. He tried his prn lasix only about 2 pills per week but it has not helped. He had knee surgery today and is leg swelling was worse. They have gave him 20 mg of lasix IV today. His surgery was on his right knee. The left leg is where he continued to have swelling and even some redness. He reports the left leg is warm to the touch as well. He did have a ultrasound at [place] two weeks ago of both legs that Dr. Lin Landsman ordered. He wants to know how Dr. Bettina Gavia recommends he proceed to regarding swelling. No shortness of breath, no weight gain.

## 2020-10-08 NOTE — Telephone Encounter (Signed)
Called patient back. Informed him that per Dr. Bettina Gavia he can take lasix 20 mg daily to see if that helps then go back to as needed. Patient understood he will let us know if this doesn't help.

## 2020-10-14 DIAGNOSIS — Z9889 Other specified postprocedural states: Secondary | ICD-10-CM | POA: Insufficient documentation

## 2020-10-15 DIAGNOSIS — R2689 Other abnormalities of gait and mobility: Secondary | ICD-10-CM | POA: Diagnosis not present

## 2020-10-15 DIAGNOSIS — M62551 Muscle wasting and atrophy, not elsewhere classified, right thigh: Secondary | ICD-10-CM | POA: Diagnosis not present

## 2020-10-15 DIAGNOSIS — M25461 Effusion, right knee: Secondary | ICD-10-CM | POA: Diagnosis not present

## 2020-10-15 DIAGNOSIS — M25561 Pain in right knee: Secondary | ICD-10-CM | POA: Diagnosis not present

## 2020-10-15 DIAGNOSIS — M25861 Other specified joint disorders, right knee: Secondary | ICD-10-CM | POA: Diagnosis not present

## 2020-10-15 NOTE — Telephone Encounter (Signed)
Pt called and stated he was to call back in a week if his leg swelling did not go down.  He stated that his knees has gone down a little better but below his knees are still swollen.  He stated he has been taking the med as he was advised but not much better.   Pt denies any other symptoms at all   Best number [phone]

## 2020-10-15 NOTE — Telephone Encounter (Signed)
I would continue the furosemide he can take an extra tablet in the afternoon every other day

## 2020-10-16 NOTE — Telephone Encounter (Signed)
Spoke to the patient just now and let him know Dr. Munley's recommendations. He verbalizes understanding and thanks me for the call back.  

## 2020-10-16 NOTE — Telephone Encounter (Signed)
Pt is returning call.  

## 2020-10-16 NOTE — Telephone Encounter (Signed)
Left message on patients voicemail to please return our call.   

## 2020-10-17 DIAGNOSIS — R2689 Other abnormalities of gait and mobility: Secondary | ICD-10-CM | POA: Diagnosis not present

## 2020-10-17 DIAGNOSIS — M25561 Pain in right knee: Secondary | ICD-10-CM | POA: Diagnosis not present

## 2020-10-17 DIAGNOSIS — M25861 Other specified joint disorders, right knee: Secondary | ICD-10-CM | POA: Diagnosis not present

## 2020-10-17 DIAGNOSIS — M25461 Effusion, right knee: Secondary | ICD-10-CM | POA: Diagnosis not present

## 2020-10-17 DIAGNOSIS — M62551 Muscle wasting and atrophy, not elsewhere classified, right thigh: Secondary | ICD-10-CM | POA: Diagnosis not present

## 2020-10-20 ENCOUNTER — Telehealth: Payer: Self-pay

## 2020-10-20 ENCOUNTER — Other Ambulatory Visit: Payer: Self-pay

## 2020-10-20 ENCOUNTER — Telehealth: Payer: Self-pay | Admitting: Cardiology

## 2020-10-20 DIAGNOSIS — M25561 Pain in right knee: Secondary | ICD-10-CM | POA: Diagnosis not present

## 2020-10-20 DIAGNOSIS — M25861 Other specified joint disorders, right knee: Secondary | ICD-10-CM | POA: Diagnosis not present

## 2020-10-20 DIAGNOSIS — R2689 Other abnormalities of gait and mobility: Secondary | ICD-10-CM | POA: Diagnosis not present

## 2020-10-20 DIAGNOSIS — M25461 Effusion, right knee: Secondary | ICD-10-CM | POA: Diagnosis not present

## 2020-10-20 DIAGNOSIS — M62551 Muscle wasting and atrophy, not elsewhere classified, right thigh: Secondary | ICD-10-CM | POA: Diagnosis not present

## 2020-10-20 MED ORDER — FUROSEMIDE 20 MG PO TABS: 20.0000 mg | ORAL_TABLET | Freq: Every day | ORAL | 4 refills | Status: DC | PRN

## 2020-10-20 NOTE — Telephone Encounter (Signed)
*  STAT* If patient is at the pharmacy, call can be transferred to refill team.   1. Which medications need to be refilled? (please list name of each medication and dose if known)  furosemide (LASIX) 20 MG tablet [[id number]]    2. Which pharmacy/location (including street and city if local pharmacy) is medication to be sent to? [facility] in [place]   3. Do they need a 30 day or 90 day supply? [facility]

## 2020-10-20 NOTE — Telephone Encounter (Signed)
Refill sent to pharmacy.   

## 2020-10-22 DIAGNOSIS — M25861 Other specified joint disorders, right knee: Secondary | ICD-10-CM | POA: Diagnosis not present

## 2020-10-22 DIAGNOSIS — M25561 Pain in right knee: Secondary | ICD-10-CM | POA: Diagnosis not present

## 2020-10-22 DIAGNOSIS — M62551 Muscle wasting and atrophy, not elsewhere classified, right thigh: Secondary | ICD-10-CM | POA: Diagnosis not present

## 2020-10-22 DIAGNOSIS — M25461 Effusion, right knee: Secondary | ICD-10-CM | POA: Diagnosis not present

## 2020-10-22 DIAGNOSIS — R2689 Other abnormalities of gait and mobility: Secondary | ICD-10-CM | POA: Diagnosis not present

## 2020-10-23 ENCOUNTER — Telehealth: Payer: Self-pay | Admitting: Cardiology

## 2020-10-23 NOTE — Telephone Encounter (Signed)
I would.

## 2020-10-23 NOTE — Telephone Encounter (Signed)
Spoke to the patient just now and let him know Dr. Joya Gaskins recommendations. He verbalizes understanding and thanks me for calling him back.    Encouraged patient to call back with any questions or concerns.

## 2020-10-23 NOTE — Telephone Encounter (Signed)
Pt c/o swelling: STAT is pt has developed SOB within 24 hours  1) How much weight have you gained and in what time span? Pt is unaware  2) If swelling, where is the swelling located? Both legs, worse in left  3) Are you currently taking a fluid pill? Yes  4) Are you currently SOB? no  5) Do you have a log of your daily weights (if so, list)? no  6) Have you gained 3 pounds in a day or 5 pounds in a week? Doesn't know  7) Have you traveled recently? No, not able to  Pt states increase in medication is not helping swelling

## 2020-10-23 NOTE — Telephone Encounter (Signed)
Spoke with patient he states he has been taking the Lasix everyday. Last Thursday he was he told to take an extra dose every other day. He states it has not been doing that and it does not seem to be any better. "My knee has gone down just a little bit but the lower leg is still tight as can be and the top of my foot is still swollen" Patient wants to know "should I still take the lasix or not?"

## 2020-10-27 DIAGNOSIS — M62551 Muscle wasting and atrophy, not elsewhere classified, right thigh: Secondary | ICD-10-CM | POA: Diagnosis not present

## 2020-10-27 DIAGNOSIS — M25861 Other specified joint disorders, right knee: Secondary | ICD-10-CM | POA: Diagnosis not present

## 2020-10-27 DIAGNOSIS — M25561 Pain in right knee: Secondary | ICD-10-CM | POA: Diagnosis not present

## 2020-10-27 DIAGNOSIS — R2689 Other abnormalities of gait and mobility: Secondary | ICD-10-CM | POA: Diagnosis not present

## 2020-10-27 DIAGNOSIS — M25461 Effusion, right knee: Secondary | ICD-10-CM | POA: Diagnosis not present

## 2020-10-29 DIAGNOSIS — M62551 Muscle wasting and atrophy, not elsewhere classified, right thigh: Secondary | ICD-10-CM | POA: Diagnosis not present

## 2020-10-29 DIAGNOSIS — M25461 Effusion, right knee: Secondary | ICD-10-CM | POA: Diagnosis not present

## 2020-10-29 DIAGNOSIS — M25861 Other specified joint disorders, right knee: Secondary | ICD-10-CM | POA: Diagnosis not present

## 2020-10-29 DIAGNOSIS — M25561 Pain in right knee: Secondary | ICD-10-CM | POA: Diagnosis not present

## 2020-10-29 DIAGNOSIS — R2689 Other abnormalities of gait and mobility: Secondary | ICD-10-CM | POA: Diagnosis not present

## 2020-10-30 DIAGNOSIS — E538 Deficiency of other specified B group vitamins: Secondary | ICD-10-CM | POA: Diagnosis not present

## 2020-11-03 ENCOUNTER — Other Ambulatory Visit: Payer: Self-pay | Admitting: Cardiology

## 2020-11-03 DIAGNOSIS — M62551 Muscle wasting and atrophy, not elsewhere classified, right thigh: Secondary | ICD-10-CM | POA: Diagnosis not present

## 2020-11-03 DIAGNOSIS — I10 Essential (primary) hypertension: Secondary | ICD-10-CM

## 2020-11-03 DIAGNOSIS — R2689 Other abnormalities of gait and mobility: Secondary | ICD-10-CM | POA: Diagnosis not present

## 2020-11-03 DIAGNOSIS — M25461 Effusion, right knee: Secondary | ICD-10-CM | POA: Diagnosis not present

## 2020-11-03 DIAGNOSIS — M25861 Other specified joint disorders, right knee: Secondary | ICD-10-CM | POA: Diagnosis not present

## 2020-11-03 DIAGNOSIS — M25561 Pain in right knee: Secondary | ICD-10-CM | POA: Diagnosis not present

## 2020-11-03 NOTE — Telephone Encounter (Signed)
Spironolactone approved and sent

## 2020-11-05 ENCOUNTER — Telehealth: Payer: Self-pay | Admitting: Cardiology

## 2020-11-05 DIAGNOSIS — M25861 Other specified joint disorders, right knee: Secondary | ICD-10-CM | POA: Diagnosis not present

## 2020-11-05 DIAGNOSIS — M25461 Effusion, right knee: Secondary | ICD-10-CM | POA: Diagnosis not present

## 2020-11-05 DIAGNOSIS — R2689 Other abnormalities of gait and mobility: Secondary | ICD-10-CM | POA: Diagnosis not present

## 2020-11-05 DIAGNOSIS — R600 Localized edema: Secondary | ICD-10-CM

## 2020-11-05 DIAGNOSIS — I10 Essential (primary) hypertension: Secondary | ICD-10-CM

## 2020-11-05 DIAGNOSIS — M25561 Pain in right knee: Secondary | ICD-10-CM | POA: Diagnosis not present

## 2020-11-05 DIAGNOSIS — M62551 Muscle wasting and atrophy, not elsewhere classified, right thigh: Secondary | ICD-10-CM | POA: Diagnosis not present

## 2020-11-05 NOTE — Telephone Encounter (Signed)
Patient called and mentioned that he needs to speak with Dr. Bettina Gavia or nurse regarding medication. Patient says that medication is not working. Please call back

## 2020-11-05 NOTE — Telephone Encounter (Signed)
Patient called back returning Morgan's call

## 2020-11-05 NOTE — Telephone Encounter (Signed)
Left message on patients voicemail to please return our call.   

## 2020-11-06 NOTE — Telephone Encounter (Signed)
Spoke to the patient just now and let him know Dr. Joya Gaskins recommendations. He verbalizes understanding but states that he would like to speak to his PCP before being referred anywhere. He states that his PCP can place this referral if needed as well.    Encouraged patient to call back with any questions or concerns.

## 2020-11-06 NOTE — Telephone Encounter (Signed)
Spoke to the patient just now and he let me know that he has been taking his furosemide for 4 weeks and the swelling in his legs has not gotten better. The swelling is from the knee down in both lower legs. He states that it is worse in his left leg compared to the right but they are both swollen.   He also states that there is some redness and it is hot to the touch. I advised that he should schedule an appointment with his family doctor to get this evaluated and that I would see if Dr. Bettina Gavia had any recommendations at this time.

## 2020-11-06 NOTE — Telephone Encounter (Signed)
Lilia Pro he does not have heart failure I would not give any higher doses of diuretic  If this is an ongoing problem causing this much trouble I think he should just see one of the vein and vascular people I recommend fields clear in [place] approaches

## 2020-11-06 NOTE — Telephone Encounter (Signed)
Patient returning call.

## 2020-11-06 NOTE — Telephone Encounter (Signed)
Left message on patients voicemail to please return our call.   

## 2020-11-10 DIAGNOSIS — R600 Localized edema: Secondary | ICD-10-CM | POA: Diagnosis not present

## 2020-11-10 DIAGNOSIS — M7122 Synovial cyst of popliteal space [Baker], left knee: Secondary | ICD-10-CM | POA: Diagnosis not present

## 2020-11-10 DIAGNOSIS — M1990 Unspecified osteoarthritis, unspecified site: Secondary | ICD-10-CM | POA: Diagnosis not present

## 2020-11-10 DIAGNOSIS — Z0000 Encounter for general adult medical examination without abnormal findings: Secondary | ICD-10-CM | POA: Diagnosis not present

## 2020-11-10 DIAGNOSIS — M25551 Pain in right hip: Secondary | ICD-10-CM | POA: Diagnosis not present

## 2020-11-12 DIAGNOSIS — I259 Chronic ischemic heart disease, unspecified: Secondary | ICD-10-CM | POA: Insufficient documentation

## 2020-11-12 DIAGNOSIS — M25551 Pain in right hip: Secondary | ICD-10-CM | POA: Diagnosis not present

## 2020-11-12 DIAGNOSIS — M25561 Pain in right knee: Secondary | ICD-10-CM | POA: Diagnosis not present

## 2020-11-12 DIAGNOSIS — E538 Deficiency of other specified B group vitamins: Secondary | ICD-10-CM | POA: Insufficient documentation

## 2020-11-12 DIAGNOSIS — R351 Nocturia: Secondary | ICD-10-CM | POA: Insufficient documentation

## 2020-11-12 DIAGNOSIS — I872 Venous insufficiency (chronic) (peripheral): Secondary | ICD-10-CM | POA: Insufficient documentation

## 2020-11-12 DIAGNOSIS — R3915 Urgency of urination: Secondary | ICD-10-CM | POA: Insufficient documentation

## 2020-11-12 DIAGNOSIS — M25562 Pain in left knee: Secondary | ICD-10-CM | POA: Diagnosis not present

## 2020-11-12 DIAGNOSIS — I4510 Unspecified right bundle-branch block: Secondary | ICD-10-CM | POA: Insufficient documentation

## 2020-11-12 DIAGNOSIS — I82409 Acute embolism and thrombosis of unspecified deep veins of unspecified lower extremity: Secondary | ICD-10-CM | POA: Insufficient documentation

## 2020-11-12 DIAGNOSIS — R350 Frequency of micturition: Secondary | ICD-10-CM | POA: Insufficient documentation

## 2020-11-12 NOTE — Telephone Encounter (Signed)
Patient calling back to speak with Lilia Pro.

## 2020-11-12 NOTE — Telephone Encounter (Signed)
Spoke to the patient just now and he requested to be seen tomorrow or Friday by any provider in regards to his lower extremity swelling. I advised that Dr. Bettina Gavia is full tomorrow but Dr. Harriet Masson did have several openings if he would like to see her then. He verbalizes understanding and accepts this appointment.

## 2020-11-12 NOTE — Addendum Note (Signed)
Addended by: Resa Miner I on: 11/12/2020 02:38 PM   Modules accepted: Orders

## 2020-11-13 ENCOUNTER — Other Ambulatory Visit: Payer: Self-pay

## 2020-11-13 ENCOUNTER — Ambulatory Visit (INDEPENDENT_AMBULATORY_CARE_PROVIDER_SITE_OTHER): Payer: Medicare Other | Admitting: Cardiology

## 2020-11-13 ENCOUNTER — Encounter: Payer: Self-pay | Admitting: Cardiology

## 2020-11-13 VITALS — BP 110/60 | HR 74 | Ht 69.0 in | Wt 175.0 lb

## 2020-11-13 DIAGNOSIS — I2510 Atherosclerotic heart disease of native coronary artery without angina pectoris: Secondary | ICD-10-CM | POA: Diagnosis not present

## 2020-11-13 DIAGNOSIS — R600 Localized edema: Secondary | ICD-10-CM

## 2020-11-13 DIAGNOSIS — I471 Supraventricular tachycardia: Secondary | ICD-10-CM | POA: Diagnosis not present

## 2020-11-13 MED ORDER — METOLAZONE 2.5 MG PO TABS: 2.5000 mg | ORAL_TABLET | Freq: Every day | ORAL | 3 refills | Status: DC

## 2020-11-13 MED ORDER — FUROSEMIDE 40 MG PO TABS: ORAL_TABLET | ORAL | 3 refills | Status: DC

## 2020-11-13 NOTE — Progress Notes (Signed)
Cardiology Office Note:    Date:  11/13/2020   ID:  Ronald Dunn, DOB 1932-06-14, MRN [id number]  PCP:  Angelina Sheriff, MD  Cardiologist:  Shirlee More, MD  Electrophysiologist:  None   Referring MD: Angelina Sheriff, MD   I have experiencing some leg swelling   History of Present Illness:    Ronald Dunn is a 87 y.o. male with a hx of chronic kidney disease, coronary artery disease status post CABG, hypertension, hyperlipidemia, paroxysmal SVT and PVC - he follows with Dr Bettina Gavia and EP - Dr. Lennie Odor.   He is here today due to worsening leg edema.  Patient tells me over the last several weeks he has been experiencing significant leg edema.  He notes that he was asked to change his diuretics to 20 mg twice daily.  Recently his PCP did put this back.  Past Medical History:  Diagnosis Date  . Abnormal stress echocardiogram 08/16/2015   Overview:  With post exercise LV dilation  . Arthritis 04/14/2017  . Borderline anemia 05/2018   no medication prescribed at this time  . Chronic ischemic heart disease   . CKD (chronic kidney disease) 04/14/2017  . Coronary artery disease involving native coronary artery of native heart 04/14/2017  . Coronary artery disease with stable angina pectoris ([place]) 09/01/2015   pt denies chest pain  . DVT (deep venous thrombosis) ([facility])    Right leg  . Dyspnea 2017  . Essential hypertension 07/22/2015  . Gout 04/14/2017  . Hyperlipidemia 07/22/2015  . Incomplete right bundle branch block (RBBB)    Noted on EKG 01/31/2018  . 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 Leukocytosis 04/14/2017  . Nocturia   . Obesity 04/14/2017  . On amiodarone therapy 09/01/2015  . PAF (paroxysmal atrial fibrillation) ([place]) 09/01/2015   pt unaware  . Pleural effusion 09/22/2015   Overview:  Left, post CABG with US guided thoracentesis  . Premature ventricular contractions (PVCs) (VPCs) 04/14/2017  . S/P CABG (coronary artery bypass graft) 04/14/2017  . Skin cancer  (melanoma) ([place]) 04/14/2017  . Urinary frequency   . Urinary urgency   . Venous insufficiency   . Vitamin B 12 deficiency     Past Surgical History:  Procedure Laterality Date  . CARDIAC CATHETERIZATION    . COLONOSCOPY    . CORNEAL TRANSPLANT Bilateral   . CORONARY ARTERY BYPASS GRAFT  08/26/2015  . FOOT SURGERY Right   . TOTAL KNEE ARTHROPLASTY Right 08/14/2018   Procedure: TOTAL KNEE ARTHROPLASTY;  Surgeon: Vickey Huger, MD;  Location: [facility];  Service: Orthopedics;  Laterality: Right;    Current Medications: Current Meds  Medication Sig  . allopurinol (ZYLOPRIM) 100 MG tablet Take 100 mg by mouth 2 (two) times daily.   Marland Kitchen aspirin EC 81 MG tablet Take 81 mg by mouth daily.  Marland Kitchen ezetimibe (ZETIA) 10 MG tablet Take 1 tablet by mouth once daily  . furosemide (LASIX) 40 MG tablet Lasix 40 mg (one tablet) daily for 7 days then Lasix 20 mg (half a tablet) daily  . loteprednol (LOTEMAX) 0.5 % ophthalmic suspension Place 1 drop into both eyes every morning.   . metolazone (ZAROXOLYN) 2.5 MG tablet Take 1 tablet (2.5 mg total) by mouth daily.  . Multiple Vitamin (MULTIVITAMIN) capsule Take 1 capsule by mouth daily.   Marland Kitchen spironolactone (ALDACTONE) 25 MG tablet Take 1 tablet by mouth once daily  . [DISCONTINUED] furosemide (LASIX) 20 MG tablet Take 1 tablet (20 mg total)  by mouth daily as needed.     Allergies:   Patient has no known allergies.   Social History   Socioeconomic History  . Marital status: Married    Spouse name: Not on file  . Number of children: Not on file  . Years of education: Not on file  . Highest education level: Not on file  Occupational History  . Not on file  Tobacco Use  . Smoking status: Never Smoker  . Smokeless tobacco: Never Used  Vaping Use  . Vaping Use: Never used  Substance and Sexual Activity  . Alcohol use: Yes    Alcohol/week: 1.0 standard drink    Types: 1 Glasses of wine per week    Comment: 5 days per week   . Drug use: No  . Sexual  activity: Not on file  Other Topics Concern  . Not on file  Social History Narrative  . Not on file   Social Determinants of Health   Financial Resource Strain: Not on file  Food Insecurity: Not on file  Transportation Needs: Not on file  Physical Activity: Not on file  Stress: Not on file  Social Connections: Not on file     Family History: The patient's family history includes Heart disease in his father; Heart failure in his father.  ROS:   Review of Systems  Constitution: Negative for decreased appetite, fever and weight gain.  HENT: Negative for congestion, ear discharge, hoarse voice and sore throat.   Eyes: Negative for discharge, redness, vision loss in right eye and visual halos.  Cardiovascular: Negative for chest pain, dyspnea on exertion, leg swelling, orthopnea and palpitations.  Respiratory: Negative for cough, hemoptysis, shortness of breath and snoring.   Endocrine: Negative for heat intolerance and polyphagia.  Hematologic/Lymphatic: Negative for bleeding problem. Does not bruise/bleed easily.  Skin: Negative for flushing, nail changes, rash and suspicious lesions.  Musculoskeletal: Negative for arthritis, joint pain, muscle cramps, myalgias, neck pain and stiffness.  Gastrointestinal: Negative for abdominal pain, bowel incontinence, diarrhea and excessive appetite.  Genitourinary: Negative for decreased libido, genital sores and incomplete emptying.  Neurological: Negative for brief paralysis, focal weakness, headaches and loss of balance.  Psychiatric/Behavioral: Negative for altered mental status, depression and suicidal ideas.  Allergic/Immunologic: Negative for HIV exposure and persistent infections.    EKGs/Labs/Other Studies Reviewed:    The following studies were reviewed today:   EKG:  None today  Recent Labs: 06/23/2020: ALT 19; BUN 19; Creatinine, Ser 1.23; Potassium 5.2; Sodium 138  Recent Lipid Panel    Component Value Date/Time   CHOL  176 06/23/2020 1008   TRIG 112 06/23/2020 1008   HDL 70 06/23/2020 1008   CHOLHDL 2.5 06/23/2020 1008   LDLCALC 86 06/23/2020 1008    Physical Exam:    VS:  BP 110/60   Pulse 74   Ht 5\' 9"  (1.753 m)   Wt 175 lb (79.4 kg)   SpO2 96%   BMI 25.84 kg/m     Wt Readings from Last 3 Encounters:  11/13/20 175 lb (79.4 kg)  08/21/20 167 lb 9.6 oz (76 kg)  06/23/20 166 lb (75.3 kg)     GEN: Well nourished, well developed in no acute distress HEENT: Normal NECK: No JVD; No carotid bruits LYMPHATICS: No lymphadenopathy CARDIAC: S1S2 noted,RRR, no murmurs, rubs, gallops RESPIRATORY:  Clear to auscultation without rales, wheezing or rhonchi  ABDOMEN: Soft, non-tender, non-distended, +bowel sounds, no guarding. EXTREMITIES:pitting edema, No cyanosis, no clubbing MUSCULOSKELETAL:  No deformity  SKIN: Warm and dry NEUROLOGIC:  Alert and oriented x 3, non-focal PSYCHIATRIC:  Normal affect, good insight  ASSESSMENT:    1. Coronary artery disease, unspecified vessel or lesion type, unspecified whether angina present, unspecified whether native or transplanted heart   2. Atrial tachycardia ([place])   3. Bilateral leg edema    PLAN:    Significant pitting edema.  This could be multifactorial his chronic kidney disease, diastolic heart failure as well as insufficiency.  With his chronic kidney disease on low-dose diuretics did not improve the his edema.  I like to change the patient with 1 week of high-dose diuretics to see if that  Will make a difference in his lower extremity edema. I would also like to reassess his LV function - therefore will order an echocardiogram.  The patient is in agreement with the above plan. The patient left the office in stable condition.  The patient will follow up in   Medication Adjustments/Labs and Tests Ordered: Current medicines are reviewed at length with the patient today.  Concerns regarding medicines are outlined above.  Orders Placed This Encounter   Procedures  . ECHOCARDIOGRAM COMPLETE   Meds ordered this encounter  Medications  . metolazone (ZAROXOLYN) 2.5 MG tablet    Sig: Take 1 tablet (2.5 mg total) by mouth daily.    Dispense:  90 tablet    Refill:  3  . furosemide (LASIX) 40 MG tablet    Sig: Lasix 40 mg (one tablet) daily for 7 days then Lasix 20 mg (half a tablet) daily    Dispense:  90 tablet    Refill:  3    Patient Instructions   Medication Instructions:  Your physician has recommended you make the following change in your medication: START: Metalozone 2.5 mg three times daily START: Lasix 40 mg (one tablet) daily for 7 days then Lasix 20 mg (half a tablet) daily *If you need a refill on your cardiac medications before your next appointment, please call your pharmacy*   Lab Work: None If you have labs (blood work) drawn today and your tests are completely normal, you will receive your results only by: Marland Kitchen MyChart Message (if you have MyChart) OR . A paper copy in the mail If you have any lab test that is abnormal or we need to change your treatment, we will call you to review the results.   Testing/Procedures: Your physician has requested that you have an echocardiogram. Echocardiography is a painless test that uses sound waves to create images of your heart. It provides your doctor with information about the size and shape of your heart and how well your heart's chambers and valves are working. This procedure takes approximately one hour. There are no restrictions for this procedure.   Follow-Up: At [facility], you and your health needs are our priority.  As part of our continuing mission to provide you with exceptional heart care, we have created designated Provider Care Teams.  These Care Teams include your primary Cardiologist (physician) and Advanced Practice Providers (APPs -  Physician Assistants and Nurse Practitioners) who all work together to provide you with the care you need, when you need  it.  We recommend signing up for the patient portal called "MyChart".  Sign up information is provided on this After Visit Summary.  MyChart is used to connect with patients for Virtual Visits (Telemedicine).  Patients are able to view lab/test results, encounter notes, upcoming appointments, etc.  Non-urgent messages can be sent to  your provider as well.   To learn more about what you can do with MyChart, go to [email or web address].    Your next appointment:   Keep your appointment with Dr. Bettina Gavia (stated by you)  The format for your next appointment:   In Person  Provider:   Shirlee More, MD   Other Instructions  Echocardiogram An echocardiogram is a test that uses sound waves (ultrasound) to produce images of the heart. Images from an echocardiogram can provide important information about:  Heart size and shape.  The size and thickness and movement of your heart's walls.  Heart muscle function and strength.  Heart valve function or if you have stenosis. Stenosis is when the heart valves are too narrow.  If blood is flowing backward through the heart valves (regurgitation).  A tumor or infectious growth around the heart valves.  Areas of heart muscle that are not working well because of poor blood flow or injury from a heart attack.  Aneurysm detection. An aneurysm is a weak or damaged part of an artery wall. The wall bulges out from the normal force of blood pumping through the body. Tell a health care provider about:  Any allergies you have.  All medicines you are taking, including vitamins, herbs, eye drops, creams, and over-the-counter medicines.  Any blood disorders you have.  Any surgeries you have had.  Any medical conditions you have.  Whether you are pregnant or may be pregnant. What are the risks? Generally, this is a safe test. However, problems may occur, including an allergic reaction to dye (contrast) that may be used during the test. What happens  before the test? No specific preparation is needed. You may eat and drink normally. What happens during the test?  You will take off your clothes from the waist up and put on a hospital gown.  Electrodes or electrocardiogram (ECG)patches may be placed on your chest. The electrodes or patches are then connected to a device that monitors your heart rate and rhythm.  You will lie down on a table for an ultrasound exam. A gel will be applied to your chest to help sound waves pass through your skin.  A handheld device, called a transducer, will be pressed against your chest and moved over your heart. The transducer produces sound waves that travel to your heart and bounce back (or "echo" back) to the transducer. These sound waves will be captured in real-time and changed into images of your heart that can be viewed on a video monitor. The images will be recorded on a computer and reviewed by your health care provider.  You may be asked to change positions or hold your breath for a short time. This makes it easier to get different views or better views of your heart.  In some cases, you may receive contrast through an IV in one of your veins. This can improve the quality of the pictures from your heart. The procedure may vary among health care providers and hospitals.   What can I expect after the test? You may return to your normal, everyday life, including diet, activities, and medicines, unless your health care provider tells you not to do that. Follow these instructions at home:  It is up to you to get the results of your test. Ask your health care provider, or the department that is doing the test, when your results will be ready.  Keep all follow-up visits. This is important. Summary  An echocardiogram is a test that  uses sound waves (ultrasound) to produce images of the heart.  Images from an echocardiogram can provide important information about the size and shape of your heart, heart  muscle function, heart valve function, and other possible heart problems.  You do not need to do anything to prepare before this test. You may eat and drink normally.  After the echocardiogram is completed, you may return to your normal, everyday life, unless your health care provider tells you not to do that. This information is not intended to replace advice given to you by your health care provider. Make sure you discuss any questions you have with your health care provider. Document Revised: 03/04/2020 Document Reviewed: 03/04/2020 Elsevier Patient Education  2021 [employer].      Adopting a Healthy Lifestyle.  Know what a healthy weight is for you (roughly BMI <25) and aim to maintain this   Aim for 7+ servings of fruits and vegetables daily   65-80+ fluid ounces of water or unsweet tea for healthy kidneys   Limit to max 1 drink of alcohol per day; avoid smoking/tobacco   Limit animal fats in diet for cholesterol and heart health - choose grass fed whenever available   Avoid highly processed foods, and foods high in saturated/trans fats   Aim for low stress - take time to unwind and care for your mental health   Aim for 150 min of moderate intensity exercise weekly for heart health, and weights twice weekly for bone health   Aim for 7-9 hours of sleep daily   When it comes to diets, agreement about the perfect plan isnt easy to find, even among the experts. Experts at the [employer] developed an idea known as the Healthy Eating Plate. Just imagine a plate divided into logical, healthy portions.   The emphasis is on diet quality:   Load up on vegetables and fruits - one-half of your plate: Aim for color and variety, and remember that potatoes dont count.   Go for whole grains - one-quarter of your plate: Whole wheat, barley, wheat berries, quinoa, oats, brown rice, and foods made with them. If you want pasta, go with whole wheat pasta.   Protein  power - one-quarter of your plate: Fish, chicken, beans, and nuts are all healthy, versatile protein sources. Limit red meat.   The diet, however, does go beyond the plate, offering a few other suggestions.   Use healthy plant oils, such as olive, canola, soy, corn, sunflower and peanut. Check the labels, and avoid partially hydrogenated oil, which have unhealthy trans fats.   If youre thirsty, drink water. Coffee and tea are good in moderation, but skip sugary drinks and limit milk and dairy products to one or two daily servings.   The type of carbohydrate in the diet is more important than the amount. Some sources of carbohydrates, such as vegetables, fruits, whole grains, and beans-are healthier than others.   Finally, stay active  Signed, Berniece Salines, DO  11/13/2020 6:15 PM    [facility]

## 2020-11-13 NOTE — Patient Instructions (Signed)
Medication Instructions:  Your physician has recommended you make the following change in your medication: START: Metalozone 2.5 mg three times daily START: Lasix 40 mg (one tablet) daily for 7 days then Lasix 20 mg (half a tablet) daily *If you need a refill on your cardiac medications before your next appointment, please call your pharmacy*   Lab Work: None If you have labs (blood work) drawn today and your tests are completely normal, you will receive your results only by: Marland Kitchen MyChart Message (if you have MyChart) OR . A paper copy in the mail If you have any lab test that is abnormal or we need to change your treatment, we will call you to review the results.   Testing/Procedures: Your physician has requested that you have an echocardiogram. Echocardiography is a painless test that uses sound waves to create images of your heart. It provides your doctor with information about the size and shape of your heart and how well your heart's chambers and valves are working. This procedure takes approximately one hour. There are no restrictions for this procedure.   Follow-Up: At [facility], you and your health needs are our priority.  As part of our continuing mission to provide you with exceptional heart care, we have created designated Provider Care Teams.  These Care Teams include your primary Cardiologist (physician) and Advanced Practice Providers (APPs -  Physician Assistants and Nurse Practitioners) who all work together to provide you with the care you need, when you need it.  We recommend signing up for the patient portal called "MyChart".  Sign up information is provided on this After Visit Summary.  MyChart is used to connect with patients for Virtual Visits (Telemedicine).  Patients are able to view lab/test results, encounter notes, upcoming appointments, etc.  Non-urgent messages can be sent to your provider as well.   To learn more about what you can do with MyChart, go to  [email or web address].    Your next appointment:   Keep your appointment with Dr. Bettina Gavia (stated by you)  The format for your next appointment:   In Person  Provider:   Shirlee More, MD   Other Instructions  Echocardiogram An echocardiogram is a test that uses sound waves (ultrasound) to produce images of the heart. Images from an echocardiogram can provide important information about:  Heart size and shape.  The size and thickness and movement of your heart's walls.  Heart muscle function and strength.  Heart valve function or if you have stenosis. Stenosis is when the heart valves are too narrow.  If blood is flowing backward through the heart valves (regurgitation).  A tumor or infectious growth around the heart valves.  Areas of heart muscle that are not working well because of poor blood flow or injury from a heart attack.  Aneurysm detection. An aneurysm is a weak or damaged part of an artery wall. The wall bulges out from the normal force of blood pumping through the body. Tell a health care provider about:  Any allergies you have.  All medicines you are taking, including vitamins, herbs, eye drops, creams, and over-the-counter medicines.  Any blood disorders you have.  Any surgeries you have had.  Any medical conditions you have.  Whether you are pregnant or may be pregnant. What are the risks? Generally, this is a safe test. However, problems may occur, including an allergic reaction to dye (contrast) that may be used during the test. What happens before the test? No specific preparation is needed.  You may eat and drink normally. What happens during the test?  You will take off your clothes from the waist up and put on a hospital gown.  Electrodes or electrocardiogram (ECG)patches may be placed on your chest. The electrodes or patches are then connected to a device that monitors your heart rate and rhythm.  You will lie down on a table for an  ultrasound exam. A gel will be applied to your chest to help sound waves pass through your skin.  A handheld device, called a transducer, will be pressed against your chest and moved over your heart. The transducer produces sound waves that travel to your heart and bounce back (or "echo" back) to the transducer. These sound waves will be captured in real-time and changed into images of your heart that can be viewed on a video monitor. The images will be recorded on a computer and reviewed by your health care provider.  You may be asked to change positions or hold your breath for a short time. This makes it easier to get different views or better views of your heart.  In some cases, you may receive contrast through an IV in one of your veins. This can improve the quality of the pictures from your heart. The procedure may vary among health care providers and hospitals.   What can I expect after the test? You may return to your normal, everyday life, including diet, activities, and medicines, unless your health care provider tells you not to do that. Follow these instructions at home:  It is up to you to get the results of your test. Ask your health care provider, or the department that is doing the test, when your results will be ready.  Keep all follow-up visits. This is important. Summary  An echocardiogram is a test that uses sound waves (ultrasound) to produce images of the heart.  Images from an echocardiogram can provide important information about the size and shape of your heart, heart muscle function, heart valve function, and other possible heart problems.  You do not need to do anything to prepare before this test. You may eat and drink normally.  After the echocardiogram is completed, you may return to your normal, everyday life, unless your health care provider tells you not to do that. This information is not intended to replace advice given to you by your health care provider.  Make sure you discuss any questions you have with your health care provider. Document Revised: 03/04/2020 Document Reviewed: 03/04/2020 Elsevier Patient Education  2021 [employer].

## 2020-11-14 ENCOUNTER — Telehealth: Payer: Self-pay | Admitting: Cardiology

## 2020-11-14 ENCOUNTER — Other Ambulatory Visit: Payer: Self-pay

## 2020-11-14 DIAGNOSIS — L814 Other melanin hyperpigmentation: Secondary | ICD-10-CM | POA: Diagnosis not present

## 2020-11-14 DIAGNOSIS — L918 Other hypertrophic disorders of the skin: Secondary | ICD-10-CM | POA: Diagnosis not present

## 2020-11-14 DIAGNOSIS — L821 Other seborrheic keratosis: Secondary | ICD-10-CM | POA: Diagnosis not present

## 2020-11-14 DIAGNOSIS — Z85820 Personal history of malignant melanoma of skin: Secondary | ICD-10-CM | POA: Diagnosis not present

## 2020-11-14 MED ORDER — METOLAZONE 2.5 MG PO TABS: ORAL_TABLET | ORAL | 0 refills | Status: DC

## 2020-11-14 NOTE — Telephone Encounter (Signed)
Spoke with patient, see chart.    

## 2020-11-14 NOTE — Telephone Encounter (Signed)
Patient called in to get clarification on how often and when he is supposed to take metolazone (ZAROXOLYN) 2.5 MG tablet. He said what the dr told him and what the actual prescription says is to different things. Please advise

## 2020-11-14 NOTE — Telephone Encounter (Signed)
Patient is following up, still requesting clarification on Metolazone instructions. Please advise.

## 2020-11-14 NOTE — Progress Notes (Signed)
Spoke with patient about taking Metolazone 2.5 mg for only 3 days, 30 minutes before taking his first dose of Lasix. Clarified his Lasix directions as well. Patient verbalizes understanding. No further questions or concerns at this time.

## 2020-11-22 DIAGNOSIS — M1990 Unspecified osteoarthritis, unspecified site: Secondary | ICD-10-CM | POA: Diagnosis not present

## 2020-11-22 DIAGNOSIS — I10 Essential (primary) hypertension: Secondary | ICD-10-CM | POA: Diagnosis not present

## 2020-11-22 DIAGNOSIS — I4892 Unspecified atrial flutter: Secondary | ICD-10-CM | POA: Diagnosis not present

## 2020-11-23 NOTE — Progress Notes (Signed)
Electrophysiology Office Note   Date:  11/24/2020   ID:  Ronald Dunn, Ronald Dunn Jul 05, 1932, MRN [id number]  PCP:  Angelina Sheriff, MD  Cardiologist: Bettina Gavia Primary Electrophysiologist:  Edin Kon Meredith Leeds, MD    Chief Complaint: Tachycardia   History of Present Illness: Ronald Dunn is a 87 y.o. male who is being seen today for the evaluation of tachycardia at the request of Angelina Sheriff, MD. Presenting today for electrophysiology evaluation.  He has a history of CKD, coronary artery disease status post CABG, hypertension, hyperlipidemia.  He was at the gym working on the elliptical when he noted that his heart rate would jump to the 140s at times.  His heart rates are usually in the 120s.  He wore a cardiac monitor that showed episodes of SVT.  He is unaware of these episodes and only knew about them while monitoring his heart on the elliptical machine.  Today, denies symptoms of palpitations, chest pain, shortness of breath, orthopnea, PND, claudication, dizziness, presyncope, syncope, bleeding, or neurologic sequela. The patient is tolerating medications without difficulties.  He is currently feeling well other than hip pain and lower extremity edema.  He has been taking Lasix for his lower extremity edema, though a physician in [facility] decreased his dose to 20 mg.  He has plan for hip surgery coming up, though they want his edema to improve.   Past Medical History:  Diagnosis Date  . Abnormal stress echocardiogram 08/16/2015   Overview:  With post exercise LV dilation  . Arthritis 04/14/2017  . Borderline anemia 05/2018   no medication prescribed at this time  . Chronic ischemic heart disease   . CKD (chronic kidney disease) 04/14/2017  . Coronary artery disease involving native coronary artery of native heart 04/14/2017  . Coronary artery disease with stable angina pectoris ([place]) 09/01/2015   pt denies chest pain  . DVT (deep venous thrombosis) (HCC)    Right  leg  . Dyspnea 2017  . Essential hypertension 07/22/2015  . Gout 04/14/2017  . Hyperlipidemia 07/22/2015  . Incomplete right bundle branch block (RBBB)    Noted on EKG 01/31/2018  . 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 Leukocytosis 04/14/2017  . Nocturia   . Obesity 04/14/2017  . On amiodarone therapy 09/01/2015  . PAF (paroxysmal atrial fibrillation) ([place]) 09/01/2015   pt unaware  . Pleural effusion 09/22/2015   Overview:  Left, post CABG with US guided thoracentesis  . Premature ventricular contractions (PVCs) (VPCs) 04/14/2017  . S/P CABG (coronary artery bypass graft) 04/14/2017  . Skin cancer (melanoma) ([place]) 04/14/2017  . Urinary frequency   . Urinary urgency   . Venous insufficiency   . Vitamin B 12 deficiency    Past Surgical History:  Procedure Laterality Date  . CARDIAC CATHETERIZATION    . COLONOSCOPY    . CORNEAL TRANSPLANT Bilateral   . CORONARY ARTERY BYPASS GRAFT  08/26/2015  . FOOT SURGERY Right   . TOTAL KNEE ARTHROPLASTY Right 08/14/2018   Procedure: TOTAL KNEE ARTHROPLASTY;  Surgeon: Vickey Huger, MD;  Location: [facility];  Service: Orthopedics;  Laterality: Right;     Current Outpatient Medications  Medication Sig Dispense Refill  . allopurinol (ZYLOPRIM) 100 MG tablet Take 100 mg by mouth 2 (two) times daily.     Marland Kitchen aspirin EC 81 MG tablet Take 81 mg by mouth daily.    Marland Kitchen ezetimibe (ZETIA) 10 MG tablet Take 1 tablet by mouth once daily 90 tablet 1  .  furosemide (LASIX) 40 MG tablet Lasix 40 mg (one tablet) daily for 7 days then Lasix 20 mg (half a tablet) daily 90 tablet 3  . loteprednol (LOTEMAX) 0.5 % ophthalmic suspension Place 1 drop into both eyes every morning.     . Multiple Vitamin (MULTIVITAMIN) capsule Take 1 capsule by mouth daily.     Marland Kitchen spironolactone (ALDACTONE) 25 MG tablet Take 1 tablet by mouth once daily 90 tablet 0   No current facility-administered medications for this visit.    Allergies:   Patient has no known allergies.   Social History:   The patient  reports that he has never smoked. He has never used smokeless tobacco. He reports current alcohol use of about 1.0 standard drink of alcohol per week. He reports that he does not use drugs.   Family History:  The patient's family history includes Heart disease in his father; Heart failure in his father.   ROS:  Please see the history of present illness.   Otherwise, review of systems is positive for none.   All other systems are reviewed and negative.   PHYSICAL EXAM: VS:  BP 126/64   Pulse 75   Ht 5\' 9"  (1.753 m)   Wt 167 lb (75.8 kg)   SpO2 99%   BMI 24.66 kg/m  , BMI Body mass index is 24.66 kg/m. GEN: Well nourished, well developed, in no acute distress  HEENT: normal  Neck: no JVD, carotid bruits, or masses Cardiac: RRR; no murmurs, rubs, or gallops, 2+ edema  Respiratory:  clear to auscultation bilaterally, normal work of breathing GI: soft, nontender, nondistended, + BS MS: no deformity or atrophy  Skin: warm and dry Neuro:  Strength and sensation are intact Psych: euthymic mood, full affect  EKG:  EKG is not ordered today. Personal review of the ekg ordered 08/21/20 shows sinus rhythm, right bundle branch block  Recent Labs: 06/23/2020: ALT 19; BUN 19; Creatinine, Ser 1.23; Potassium 5.2; Sodium 138    Lipid Panel     Component Value Date/Time   CHOL 176 06/23/2020 1008   TRIG 112 06/23/2020 1008   HDL 70 06/23/2020 1008   CHOLHDL 2.5 06/23/2020 1008   LDLCALC 86 06/23/2020 1008     Wt Readings from Last 3 Encounters:  11/24/20 167 lb (75.8 kg)  11/13/20 175 lb (79.4 kg)  08/21/20 167 lb 9.6 oz (76 kg)      Other studies Reviewed: Additional studies/ records that were reviewed today include: Cardiac monitor 07/14/2020 personally reviewed Review of the above records today demonstrates:  Occasional PVCs with couplets and triplets, occasional supraventricular ectopy with frequent episodes of atrial tachycardia.    ASSESSMENT AND PLAN:  1.   SVT: Has had multiple episodes of SVT though the patient is unaware.  As he is unaware, we Delfino Friesen continue to hold off on further therapy.  No changes.  I Evva Din see him back as needed as he is asymptomatic.  2.  Coronary artery disease status post CABG: No current chest pain.  Plan per primary cardiology.  3.  Hypertension: Currently well controlled.  He does have some lower extremity edema.  I have told him to take Lasix 40 mg a day for the next week.   Current medicines are reviewed at length with the patient today.   The patient does not have concerns regarding his medicines.  The following changes were made today: None  Labs/ tests ordered today include:  No orders of the defined types were placed  in this encounter.    Disposition:   FU with Rosina Cressler as needed months  Signed, Amitai Delaughter Meredith Leeds, MD  11/24/2020 10:54 AM     [facility] [address] [address] [phone] (office) [phone] (fax)

## 2020-11-24 ENCOUNTER — Other Ambulatory Visit: Payer: Self-pay

## 2020-11-24 ENCOUNTER — Encounter: Payer: Self-pay | Admitting: Cardiology

## 2020-11-24 ENCOUNTER — Ambulatory Visit (INDEPENDENT_AMBULATORY_CARE_PROVIDER_SITE_OTHER): Payer: Medicare Other | Admitting: Cardiology

## 2020-11-24 VITALS — BP 126/64 | HR 75 | Ht 69.0 in | Wt 167.0 lb

## 2020-11-24 DIAGNOSIS — I471 Supraventricular tachycardia: Secondary | ICD-10-CM

## 2020-11-24 DIAGNOSIS — I2510 Atherosclerotic heart disease of native coronary artery without angina pectoris: Secondary | ICD-10-CM | POA: Diagnosis not present

## 2020-11-24 NOTE — Patient Instructions (Signed)
Medication Instructions:  Your physician has recommended you make the following change in your medication:  1. INCREASE Lasix to 40 mg daily for one week, then return to normal dosing.  *If you need a refill on your cardiac medications before your next appointment, please call your pharmacy*   Lab Work: None ordered   Testing/Procedures: None ordered   Follow-Up: At [facility], you and your health needs are our priority.  As part of our continuing mission to provide you with exceptional heart care, we have created designated Provider Care Teams.  These Care Teams include your primary Cardiologist (physician) and Advanced Practice Providers (APPs -  Physician Assistants and Nurse Practitioners) who all work together to provide you with the care you need, when you need it.   Your next appointment:    as needed  The format for your next appointment:   In Person  Provider:   Allegra Lai, MD    Thank you for choosing [facility]!!   Trinidad Curet, RN [phone]

## 2020-11-26 DIAGNOSIS — M7122 Synovial cyst of popliteal space [Baker], left knee: Secondary | ICD-10-CM | POA: Diagnosis not present

## 2020-11-26 DIAGNOSIS — M25562 Pain in left knee: Secondary | ICD-10-CM | POA: Insufficient documentation

## 2020-11-27 DIAGNOSIS — D519 Vitamin B12 deficiency anemia, unspecified: Secondary | ICD-10-CM | POA: Diagnosis not present

## 2020-12-12 ENCOUNTER — Telehealth: Payer: Self-pay

## 2020-12-12 ENCOUNTER — Ambulatory Visit (INDEPENDENT_AMBULATORY_CARE_PROVIDER_SITE_OTHER): Payer: Medicare Other

## 2020-12-12 ENCOUNTER — Other Ambulatory Visit: Payer: Self-pay

## 2020-12-12 ENCOUNTER — Other Ambulatory Visit: Payer: Self-pay | Admitting: *Deleted

## 2020-12-12 DIAGNOSIS — R600 Localized edema: Secondary | ICD-10-CM

## 2020-12-12 DIAGNOSIS — M25561 Pain in right knee: Secondary | ICD-10-CM

## 2020-12-12 DIAGNOSIS — M25562 Pain in left knee: Secondary | ICD-10-CM

## 2020-12-12 LAB — ECHOCARDIOGRAM COMPLETE
Area-P 1/2: 3.34 cm2
S' Lateral: 2.9 cm

## 2020-12-12 MED ORDER — FUROSEMIDE 40 MG PO TABS
40.0000 mg | ORAL_TABLET | Freq: Every day | ORAL | 3 refills | Status: DC
Start: 2020-12-12 — End: 2021-06-23

## 2020-12-12 NOTE — Telephone Encounter (Signed)
Spoke with patient regarding results and recommendation.  Patient verbalizes understanding and is agreeable to plan of care. Advised patient to call back with any issues or concerns.  

## 2020-12-12 NOTE — Telephone Encounter (Signed)
Spoke to the patient just now and let him know Dr. Munley's recommendations. He verbalizes understanding and thanks me for the call back.  

## 2020-12-12 NOTE — Addendum Note (Signed)
Addended by: Resa Miner I on: 12/12/2020 04:21 PM   Modules accepted: Orders

## 2020-12-12 NOTE — Telephone Encounter (Signed)
-----   Message from Brian J Munley, MD sent at 12/12/2020  2:31 PM EDT ----- Good result no findings of congestive heart failure 

## 2020-12-12 NOTE — Telephone Encounter (Signed)
-----   Message from Richardo Priest, MD sent at 12/12/2020  2:31 PM EDT ----- Good result no findings of congestive heart failure

## 2020-12-12 NOTE — Telephone Encounter (Signed)
The problem is not congestive heart failure Spoke with his family doctor and I thought he was going to go see the vascular surgeons regarding his leg swelling If the furosemide was helpful he can take 40 mg a day but is solely to relieve symptoms.

## 2020-12-12 NOTE — Telephone Encounter (Signed)
Spoke to the patient just now and he let me know that he is still having significant swelling in his left leg. He states that when he saw Dr. Curt Bears he increased his furosemide to 40 mg daily for one week but he is now back to taking 20 mg daily. He states that the swelling in his right leg is much better but the swelling on the left is not. He would like recommendations from Dr. Bettina Gavia at this time.

## 2020-12-12 NOTE — Progress Notes (Signed)
Complete echocardiogram performed.  Jimmy Genova Kiner RDCS, RVT  

## 2020-12-16 ENCOUNTER — Other Ambulatory Visit: Payer: Self-pay | Admitting: Cardiology

## 2020-12-23 ENCOUNTER — Other Ambulatory Visit: Payer: Self-pay

## 2020-12-23 ENCOUNTER — Ambulatory Visit (INDEPENDENT_AMBULATORY_CARE_PROVIDER_SITE_OTHER): Payer: Medicare Other | Admitting: Cardiology

## 2020-12-23 ENCOUNTER — Ambulatory Visit: Payer: Medicare Other | Admitting: Cardiology

## 2020-12-23 ENCOUNTER — Encounter: Payer: Self-pay | Admitting: Cardiology

## 2020-12-23 VITALS — BP 143/69 | HR 80 | Ht 69.0 in | Wt 166.8 lb

## 2020-12-23 DIAGNOSIS — I471 Supraventricular tachycardia: Secondary | ICD-10-CM | POA: Diagnosis not present

## 2020-12-23 DIAGNOSIS — E785 Hyperlipidemia, unspecified: Secondary | ICD-10-CM | POA: Diagnosis not present

## 2020-12-23 DIAGNOSIS — R600 Localized edema: Secondary | ICD-10-CM

## 2020-12-23 DIAGNOSIS — I10 Essential (primary) hypertension: Secondary | ICD-10-CM | POA: Diagnosis not present

## 2020-12-23 DIAGNOSIS — I2510 Atherosclerotic heart disease of native coronary artery without angina pectoris: Secondary | ICD-10-CM | POA: Diagnosis not present

## 2020-12-23 NOTE — Progress Notes (Signed)
Cardiology Office Note:    Date:  12/23/2020   ID:  Ronald Dunn, DOB 1932/02/03, MRN [id number]  PCP:  Angelina Sheriff, MD  Cardiologist:  Shirlee More, MD    Referring MD: Angelina Sheriff, MD    ASSESSMENT:    1. Coronary artery disease, unspecified vessel or lesion type, unspecified whether angina present, unspecified whether native or transplanted heart   2. Atrial tachycardia ([place])   3. Essential hypertension   4. Hyperlipidemia, unspecified hyperlipidemia type   5. Lower extremity edema    PLAN:    In order of problems listed above:  1. CAD following CABG optimized for his planned orthopedic surgery 2. Stable asymptomatic presently not on suppressive therapy 3. At target continue current treatment 4. Stable continue with statin 5. Improved I think this is predominantly venous insufficiency support hose are beneficial he wears them during the day we will continue his current diuretics recheck renal function   Next appointment: 6 months   Medication Adjustments/Labs and Tests Ordered: Current medicines are reviewed at length with the patient today.  Concerns regarding medicines are outlined above.  No orders of the defined types were placed in this encounter.  No orders of the defined types were placed in this encounter.   Chief Complaint  Patient presents with  . Follow-up  . Coronary Artery Disease    History of Present Illness:    Ronald Dunn is a 87 y.o. male with CAD and history of CABG hypertension hyperlipidemia CKD lower extremity edema pending evaluation by vascular surgery and SVT recently captured on event monitor and seen in EP Dr. Curt Bears 11/24/2020.  He was asymptomatic and was not felt to require suppressive treatment Compliance with diet, lifestyle and medications: Yes  An echocardiogram performed 12/12/2020 because of persistent lower extremity edema and concerns of heart failure EF was normal 60 to 65% mild concentric LVH  normal right ventricular function no evidence of pulmonary hypertension venous pressures were normal and diastolic function was normal.  Taking both loop diuretic and MRA.  This assessed 2020 was normal.  Take a higher dose of diuretic and support as it is reduce his edema especially on the right lower extremity. He anticipates total hip arthroplasty on the right. No chest pain palpitation shortness of breath or syncope. From my perspective he is optimized for his planned surgical procedure. Past Medical History:  Diagnosis Date  . Abnormal stress echocardiogram 08/16/2015   Overview:  With post exercise LV dilation  . Arthritis 04/14/2017  . Borderline anemia 05/2018   no medication prescribed at this time  . Chronic ischemic heart disease   . CKD (chronic kidney disease) 04/14/2017  . Coronary artery disease involving native coronary artery of native heart 04/14/2017  . Coronary artery disease with stable angina pectoris ([place]) 09/01/2015   pt denies chest pain  . DVT (deep venous thrombosis) (HCC)    Right leg  . Dyspnea 2017  . Essential hypertension 07/22/2015  . Gout 04/14/2017  . Hyperlipidemia 07/22/2015  . Incomplete right bundle branch block (RBBB)    Noted on EKG 01/31/2018  . 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 Leukocytosis 04/14/2017  . Nocturia   . Obesity 04/14/2017  . On amiodarone therapy 09/01/2015  . PAF (paroxysmal atrial fibrillation) ([place]) 09/01/2015   pt unaware  . Pleural effusion 09/22/2015   Overview:  Left, post CABG with US guided thoracentesis  . Premature ventricular contractions (PVCs) (VPCs) 04/14/2017  . S/P CABG (coronary  artery bypass graft) 04/14/2017  . Skin cancer (melanoma) ([place]) 04/14/2017  . Urinary frequency   . Urinary urgency   . Venous insufficiency   . Vitamin B 12 deficiency     Past Surgical History:  Procedure Laterality Date  . CARDIAC CATHETERIZATION    . COLONOSCOPY    . CORNEAL TRANSPLANT Bilateral   . CORONARY ARTERY BYPASS GRAFT   08/26/2015  . FOOT SURGERY Right   . TOTAL KNEE ARTHROPLASTY Right 08/14/2018   Procedure: TOTAL KNEE ARTHROPLASTY;  Surgeon: Vickey Huger, MD;  Location: [facility];  Service: Orthopedics;  Laterality: Right;    Current Medications: Current Meds  Medication Sig  . allopurinol (ZYLOPRIM) 100 MG tablet Take 100 mg by mouth 2 (two) times daily.   Marland Kitchen aspirin EC 81 MG tablet Take 81 mg by mouth daily.  Marland Kitchen ezetimibe (ZETIA) 10 MG tablet Take 1 tablet by mouth once daily  . furosemide (LASIX) 40 MG tablet Take 1 tablet (40 mg total) by mouth daily.  Marland Kitchen loteprednol (LOTEMAX) 0.5 % ophthalmic suspension Place 1 drop into both eyes every morning.   . Multiple Vitamin (MULTIVITAMIN) capsule Take 1 capsule by mouth daily.   Marland Kitchen spironolactone (ALDACTONE) 25 MG tablet Take 1 tablet by mouth once daily     Allergies:   Patient has no known allergies.   Social History   Socioeconomic History  . Marital status: Married    Spouse name: Not on file  . Number of children: Not on file  . Years of education: Not on file  . Highest education level: Not on file  Occupational History  . Not on file  Tobacco Use  . Smoking status: Never Smoker  . Smokeless tobacco: Never Used  Vaping Use  . Vaping Use: Never used  Substance and Sexual Activity  . Alcohol use: Yes    Alcohol/week: 1.0 standard drink    Types: 1 Glasses of wine per week    Comment: 5 days per week   . Drug use: No  . Sexual activity: Not on file  Other Topics Concern  . Not on file  Social History Narrative  . Not on file   Social Determinants of Health   Financial Resource Strain: Not on file  Food Insecurity: Not on file  Transportation Needs: Not on file  Physical Activity: Not on file  Stress: Not on file  Social Connections: Not on file     Family History: The patient's family history includes Heart disease in his father; Heart failure in his father. ROS:   Please see the history of present illness.    All other systems  reviewed and are negative.  EKGs/Labs/Other Studies Reviewed:    The following studies were reviewed today: Recent Labs: 06/23/2020: ALT 19; BUN 19; Creatinine, Ser 1.23; Potassium 5.2; Sodium 138  Recent Lipid Panel    Component Value Date/Time   CHOL 176 06/23/2020 1008   TRIG 112 06/23/2020 1008   HDL 70 06/23/2020 1008   CHOLHDL 2.5 06/23/2020 1008   LDLCALC 86 06/23/2020 1008    Physical Exam:    VS:  BP (!) 143/69   Pulse 80   Ht 5\' 9"  (1.753 m)   Wt 166 lb 12.8 oz (75.7 kg)   SpO2 98%   BMI 24.63 kg/m     Wt Readings from Last 3 Encounters:  12/23/20 166 lb 12.8 oz (75.7 kg)  11/24/20 167 lb (75.8 kg)  11/13/20 175 lb (79.4 kg)  GEN:  Well nourished, well developed in no acute distress HEENT: Normal NECK: No JVD; No carotid bruits LYMPHATICS: No lymphadenopathy CARDIAC: RRR, no murmurs, rubs, gallops RESPIRATORY:  Clear to auscultation without rales, wheezing or rhonchi  ABDOMEN: Soft, non-tender, non-distended MUSCULOSKELETAL: Right lower extremity trace to 1+, left lower extremity 2+ edema; No deformity  SKIN: Warm and dry NEUROLOGIC:  Alert and oriented x 3 PSYCHIATRIC:  Normal affect    Signed, Shirlee More, MD  12/23/2020 9:52 AM    [place]

## 2020-12-23 NOTE — Patient Instructions (Signed)
Medication Instructions:  Your physician recommends that you continue on your current medications as directed. Please refer to the Current Medication list given to you today.  *If you need a refill on your cardiac medications before your next appointment, please call your pharmacy*   Lab Work: Your physician recommends that you return for lab work in: TODAY BMP, ProBNP If you have labs (blood work) drawn today and your tests are completely normal, you will receive your results only by: . MyChart Message (if you have MyChart) OR . A paper copy in the mail If you have any lab test that is abnormal or we need to change your treatment, we will call you to review the results.   Testing/Procedures: None   Follow-Up: At [facility], you and your health needs are our priority.  As part of our continuing mission to provide you with exceptional heart care, we have created designated Provider Care Teams.  These Care Teams include your primary Cardiologist (physician) and Advanced Practice Providers (APPs -  Physician Assistants and Nurse Practitioners) who all work together to provide you with the care you need, when you need it.  We recommend signing up for the patient portal called "MyChart".  Sign up information is provided on this After Visit Summary.  MyChart is used to connect with patients for Virtual Visits (Telemedicine).  Patients are able to view lab/test results, encounter notes, upcoming appointments, etc.  Non-urgent messages can be sent to your provider as well.   To learn more about what you can do with MyChart, go to [email or web address].    Your next appointment:   6 month(s)  The format for your next appointment:   In Person  Provider:   Brian Munley, MD   Other Instructions   

## 2020-12-24 ENCOUNTER — Telehealth: Payer: Self-pay

## 2020-12-24 ENCOUNTER — Telehealth: Payer: Self-pay | Admitting: Cardiology

## 2020-12-24 DIAGNOSIS — D519 Vitamin B12 deficiency anemia, unspecified: Secondary | ICD-10-CM | POA: Diagnosis not present

## 2020-12-24 LAB — COMPREHENSIVE METABOLIC PANEL
ALT: 18 IU/L (ref 0–44)
AST: 23 IU/L (ref 0–40)
Albumin/Globulin Ratio: 1.3 (ref 1.2–2.2)
Albumin: 4.1 g/dL (ref 3.6–4.6)
Alkaline Phosphatase: 101 IU/L (ref 44–121)
BUN/Creatinine Ratio: 20 (ref 10–24)
BUN: 24 mg/dL (ref 8–27)
Bilirubin Total: 0.3 mg/dL (ref 0.0–1.2)
CO2: 25 mmol/L (ref 20–29)
Calcium: 9.6 mg/dL (ref 8.6–10.2)
Chloride: 98 mmol/L (ref 96–106)
Creatinine, Ser: 1.2 mg/dL (ref 0.76–1.27)
Globulin, Total: 3.1 g/dL (ref 1.5–4.5)
Glucose: 92 mg/dL (ref 65–99)
Potassium: 5.4 mmol/L — ABNORMAL HIGH (ref 3.5–5.2)
Sodium: 136 mmol/L (ref 134–144)
Total Protein: 7.2 g/dL (ref 6.0–8.5)
eGFR: 59 mL/min/{1.73_m2} — ABNORMAL LOW (ref >59)

## 2020-12-24 LAB — PRO B NATRIURETIC PEPTIDE: NT-Pro BNP: 265 pg/mL (ref 0–486)

## 2020-12-24 NOTE — Telephone Encounter (Signed)
-----   Message from Richardo Priest, MD sent at 12/24/2020  7:46 AM EDT ----- Stable results no changes

## 2020-12-24 NOTE — Telephone Encounter (Signed)
Spoke with patient regarding results and recommendation.  Patient verbalizes understanding and is agreeable to plan of care. Advised patient to call back with any issues or concerns.  

## 2020-12-24 NOTE — Telephone Encounter (Signed)
Left message on patients voicemail to please return our call.   

## 2020-12-24 NOTE — Telephone Encounter (Signed)
Follow Up:    Returning [facility]'s call, concerning his lab results.

## 2020-12-24 NOTE — Telephone Encounter (Signed)
Returned patient call about lab results.  Spoke with patient and let him know your labs are stable per Dr. Bettina Gavia and to continue the current plan.

## 2020-12-25 DIAGNOSIS — M25551 Pain in right hip: Secondary | ICD-10-CM | POA: Diagnosis not present

## 2020-12-25 DIAGNOSIS — M1611 Unilateral primary osteoarthritis, right hip: Secondary | ICD-10-CM | POA: Diagnosis not present

## 2020-12-25 DIAGNOSIS — M25562 Pain in left knee: Secondary | ICD-10-CM | POA: Diagnosis not present

## 2021-01-05 DIAGNOSIS — E785 Hyperlipidemia, unspecified: Secondary | ICD-10-CM | POA: Diagnosis not present

## 2021-01-05 DIAGNOSIS — Z01818 Encounter for other preprocedural examination: Secondary | ICD-10-CM | POA: Diagnosis not present

## 2021-01-05 DIAGNOSIS — Z79899 Other long term (current) drug therapy: Secondary | ICD-10-CM | POA: Diagnosis not present

## 2021-01-09 NOTE — Patient Instructions (Signed)
DUE TO COVID-19 ONLY ONE VISITOR IS ALLOWED TO COME WITH YOU AND [facility] IN THE WAITING ROOM ONLY DURING PRE OP AND PROCEDURE DAY OF SURGERY. THE 2 VISITORS  MAY VISIT WITH YOU AFTER SURGERY IN YOUR PRIVATE ROOM DURING VISITING HOURS ONLY!  YOU NEED TO HAVE A COVID 19 TEST ON__6/20_____ @__1 :05 pm_____, THIS TEST MUST BE DONE BEFORE SURGERY,  COVID TESTING SITE [facility] [id number], IT IS ON THE RIGHT GOING OUT [address] APPROXIMATELY  2 MINUTES PAST [employer] ON THE RIGHT. ONCE YOUR COVID TEST IS COMPLETED,  PLEASE BEGIN THE QUARANTINE INSTRUCTIONS AS OUTLINED IN YOUR HANDOUT.                [facility]     Your procedure is scheduled on: 01/14/21   Report to [facility] Main  Entrance   Report to admitting at  12:25 PM     Call this number if you have problems the morning of surgery [facility], NO CHEWING GUM [facility].    No food after midnight.    You may have clear liquid until 11:30 AM.    CLEAR LIQUID DIET   Foods Allowed                                                                     Foods Excluded  Coffee and tea, regular and decaf                             liquids that you cannot  Plain Jell-O any favor except red or purple                                           see through such as: Fruit ices (not with fruit pulp)                                     milk, soups, orange juice  Iced Popsicles                                    All solid food Carbonated beverages, regular and diet                                    Cranberry, grape and apple juices Sports drinks like Gatorade Lightly seasoned clear broth or consume(fat free) Sugar, honey syrup  _____________________________________________________________________       At 11:00 AM drink pre surgery drink.   Nothing by mouth after 11:30 AM.    Take these medicines the morning of  surgery with A SIP OF WATER: Allopurinol, use your eye drops  You may not have any metal on your body including              piercings  Do not wear jewelry, lotions, powders or deodorant                         Men may shave face and neck.   Do not bring valuables to the [facility]. [facility].  Contacts, dentures or bridgework may not be worn into surgery.        Name and phone number of your driver:  Special Instructions: N/A              Please read over the following fact sheets you were given: _____________________________________________________________________             [facility] - Preparing for Surgery Before surgery, you can play an important role.  Because skin is not sterile, your skin needs to be as free of germs as possible.  You can reduce the number of germs on your skin by washing with CHG (chlorahexidine gluconate) soap before surgery.  CHG is an antiseptic cleaner which kills germs and bonds with the skin to continue killing germs even after washing. Please DO NOT use if you have an allergy to CHG or antibacterial soaps.  If your skin becomes reddened/irritated stop using the CHG and inform your nurse when you arrive at [facility].  You may shave your face/neck.  Please follow these instructions carefully:  1.  Shower with CHG Soap the night before surgery and the  morning of Surgery.  2.  If you choose to wash your hair, wash your hair first as usual with your  normal  shampoo.  3.  After you shampoo, rinse your hair and body thoroughly to remove the  shampoo.                                       4.  Use CHG as you would any other liquid soap.  You can apply chg directly  to the skin and wash                       Gently with a scrungie or clean washcloth.  5.  Apply the CHG Soap to your body ONLY FROM THE NECK DOWN.   Do not use on face/ open                           Wound or open  sores. Avoid contact with eyes, ears mouth and genitals (private parts).                       Wash face,  Genitals (private parts) with your normal soap.             6.  Wash thoroughly, paying special attention to the area where your surgery  will be performed.  7.  Thoroughly rinse your body with warm water from the neck down.  8.  DO NOT shower/wash with your normal soap after using and rinsing off  the CHG Soap.             9.  Pat yourself dry with a clean  towel.            10.  Wear clean pajamas.            11.  Place clean sheets on your bed the night of your first shower and do not  sleep with pets. Day of Surgery : Do not apply any lotions/deodorants the morning of surgery.  Please wear clean clothes to the [facility]/surgery center.  FAILURE TO FOLLOW THESE INSTRUCTIONS MAY RESULT IN THE CANCELLATION OF YOUR SURGERY PATIENT SIGNATURE_________________________________  NURSE SIGNATURE__________________________________  ________________________________________________________________________   [name]  An incentive spirometer is a tool that can help keep your lungs clear and active. This tool measures how well you are filling your lungs with each breath. Taking long deep breaths may help reverse or decrease the chance of developing breathing (pulmonary) problems (especially infection) following: A long period of time when you are unable to move or be active. BEFORE THE PROCEDURE  If the spirometer includes an indicator to show your best effort, your nurse or respiratory therapist will set it to a desired goal. If possible, sit up straight or lean slightly forward. Try not to slouch. Hold the incentive spirometer in an upright position. INSTRUCTIONS FOR USE  Sit on the edge of your bed if possible, or sit up as far as you can in bed or on a chair. Hold the incentive spirometer in an upright position. Breathe out normally. Place the mouthpiece in your mouth and seal  your lips tightly around it. Breathe in slowly and as deeply as possible, raising the piston or the ball toward the top of the column. Hold your breath for 3-5 seconds or for as long as possible. Allow the piston or ball to fall to the bottom of the column. Remove the mouthpiece from your mouth and breathe out normally. Rest for a few seconds and repeat Steps 1 through 7 at least 10 times every 1-2 hours when you are awake. Take your time and take a few normal breaths between deep breaths. The spirometer may include an indicator to show your best effort. Use the indicator as a goal to work toward during each repetition. After each set of 10 deep breaths, practice coughing to be sure your lungs are clear. If you have an incision (the cut made at the time of surgery), support your incision when coughing by placing a pillow or rolled up towels firmly against it. Once you are able to get out of bed, walk around indoors and cough well. You may stop using the incentive spirometer when instructed by your caregiver.  RISKS AND COMPLICATIONS Take your time so you do not get dizzy or light-headed. If you are in pain, you may need to take or ask for pain medication before doing incentive spirometry. It is harder to take a deep breath if you are having pain. AFTER USE Rest and breathe slowly and easily. It can be helpful to keep track of a log of your progress. Your caregiver can provide you with a simple table to help with this. If you are using the spirometer at home, follow these instructions: [place] IF:  You are having difficultly using the spirometer. You have trouble using the spirometer as often as instructed. Your pain medication is not giving enough relief while using the spirometer. You develop fever of 100.5 F (38.1 C) or higher. SEEK IMMEDIATE MEDICAL CARE IF:  You cough up bloody sputum that had not been present before. You develop fever of 102  F (38.9 C) or greater. You  develop worsening pain at or near the incision site. MAKE SURE YOU:  Understand these instructions. Will watch your condition. Will get help right away if you are not doing well or get worse. Document Released: 11/22/2006 Document Revised: 10/04/2011 Document Reviewed: 01/23/2007 [facility] Patient Information 2014 [place], [place].   ________________________________________________________________________

## 2021-01-12 ENCOUNTER — Other Ambulatory Visit: Payer: Self-pay

## 2021-01-12 ENCOUNTER — Encounter (HOSPITAL_COMMUNITY)
Admission: RE | Admit: 2021-01-12 | Discharge: 2021-01-12 | Disposition: A | Discharge status: Home / Self Care | Payer: Medicare Other | Source: Ambulatory Visit | Attending: Orthopedic Surgery | Admitting: Orthopedic Surgery

## 2021-01-12 ENCOUNTER — Encounter (HOSPITAL_COMMUNITY): Payer: Self-pay

## 2021-01-12 ENCOUNTER — Other Ambulatory Visit (HOSPITAL_COMMUNITY)
Admission: RE | Admit: 2021-01-12 | Discharge: 2021-01-12 | Disposition: A | Discharge status: Home / Self Care | Payer: Medicare Other | Source: Ambulatory Visit | Attending: Orthopedic Surgery | Admitting: Orthopedic Surgery

## 2021-01-12 DIAGNOSIS — Z20822 Contact with and (suspected) exposure to covid-19: Secondary | ICD-10-CM | POA: Insufficient documentation

## 2021-01-12 DIAGNOSIS — I129 Hypertensive chronic kidney disease with stage 1 through stage 4 chronic kidney disease, or unspecified chronic kidney disease: Secondary | ICD-10-CM | POA: Insufficient documentation

## 2021-01-12 DIAGNOSIS — I2510 Atherosclerotic heart disease of native coronary artery without angina pectoris: Secondary | ICD-10-CM | POA: Diagnosis not present

## 2021-01-12 DIAGNOSIS — Z7982 Long term (current) use of aspirin: Secondary | ICD-10-CM | POA: Diagnosis not present

## 2021-01-12 DIAGNOSIS — M1611 Unilateral primary osteoarthritis, right hip: Secondary | ICD-10-CM | POA: Insufficient documentation

## 2021-01-12 DIAGNOSIS — Z951 Presence of aortocoronary bypass graft: Secondary | ICD-10-CM | POA: Insufficient documentation

## 2021-01-12 DIAGNOSIS — Z86718 Personal history of other venous thrombosis and embolism: Secondary | ICD-10-CM | POA: Insufficient documentation

## 2021-01-12 DIAGNOSIS — Z01812 Encounter for preprocedural laboratory examination: Secondary | ICD-10-CM | POA: Insufficient documentation

## 2021-01-12 DIAGNOSIS — Z79899 Other long term (current) drug therapy: Secondary | ICD-10-CM | POA: Insufficient documentation

## 2021-01-12 DIAGNOSIS — N189 Chronic kidney disease, unspecified: Secondary | ICD-10-CM | POA: Diagnosis not present

## 2021-01-12 LAB — SURGICAL PCR SCREEN
MRSA, PCR: NEGATIVE
Staphylococcus aureus: NEGATIVE

## 2021-01-12 LAB — COMPREHENSIVE METABOLIC PANEL
ALT: 18 U/L (ref 0–44)
AST: 22 U/L (ref 15–41)
Albumin: 3.9 g/dL (ref 3.5–5.0)
Alkaline Phosphatase: 82 U/L (ref 38–126)
Anion gap: 7 (ref 5–15)
BUN: 29 mg/dL — ABNORMAL HIGH (ref 8–23)
CO2: 26 mmol/L (ref 22–32)
Calcium: 9.4 mg/dL (ref 8.9–10.3)
Chloride: 100 mmol/L (ref 98–111)
Creatinine, Ser: 1.2 mg/dL (ref 0.61–1.24)
GFR, Estimated: 59 mL/min — ABNORMAL LOW (ref >60)
Glucose, Bld: 90 mg/dL (ref 70–99)
Potassium: 4.7 mmol/L (ref 3.5–5.1)
Sodium: 133 mmol/L — ABNORMAL LOW (ref 135–145)
Total Bilirubin: 0.5 mg/dL (ref 0.3–1.2)
Total Protein: 7.6 g/dL (ref 6.5–8.1)

## 2021-01-12 LAB — PROTIME-INR
INR: 1.1 (ref 0.8–1.2)
Prothrombin Time: 13.7 seconds (ref 11.4–15.2)

## 2021-01-12 LAB — CBC
HCT: 37.7 % — ABNORMAL LOW (ref 39.0–52.0)
Hemoglobin: 12.4 g/dL — ABNORMAL LOW (ref 13.0–17.0)
MCH: 31.3 pg (ref 26.0–34.0)
MCHC: 32.9 g/dL (ref 30.0–36.0)
MCV: 95.2 fL (ref 80.0–100.0)
Platelets: 182 10*3/uL (ref 150–400)
RBC: 3.96 MIL/uL — ABNORMAL LOW (ref 4.22–5.81)
RDW: 14.6 % (ref 11.5–15.5)
WBC: 7.5 10*3/uL (ref 4.0–10.5)
nRBC: 0 % (ref 0.0–0.2)

## 2021-01-12 LAB — SARS CORONAVIRUS 2 (TAT 6-24 HRS): SARS Coronavirus 2: NEGATIVE

## 2021-01-12 NOTE — Progress Notes (Signed)
COVID Vaccine Completed:Yes Date COVID Vaccine completed:09/11/20-booster 05/23/20 COVID vaccine manufacturer: Moderna    PCP - Dr. Sheffield Slider LOV 01/05/21 Cardiologist - Dr. Rinaldo Cloud LOV 12/23/20  Chest x-ray - no EKG - 08/21/20-epic Stress Test - no ECHO - 5/0/22-epic Cardiac Cath - 08/26/15- CABG 08/26/15 Pacemaker/ICD device last checked:NA  Sleep Study - no CPAP -   Fasting Blood Sugar - NA Checks Blood Sugar _____ times a day  Blood Thinner Instructions:ASA 81/ Dr. Bettina Gavia Aspirin Instructions:Stop 7 days prior to DOS/ Dr. Wynelle Link Last Dose:01/06/21  Anesthesia review:   Patient denies shortness of breath, fever, cough and chest pain at PAT appointment  Yes. Pt climbs stairs slowly and he reports no SOB doing house work or with ADLs  Patient verbalized understanding of instructions that were given to them at the PAT appointment. Patient was also instructed that they will need to review over the PAT instructions again at home before surgery. Yes

## 2021-01-13 NOTE — Anesthesia Preprocedure Evaluation (Addendum)
Anesthesia Evaluation  Patient identified by MRN, date of birth, ID band Patient awake    Reviewed: Allergy & Precautions, NPO status , Patient's Chart, lab work & pertinent test results  Airway Mallampati: II  TM Distance: >3 FB Neck ROM: Full    Dental no notable dental hx.    Pulmonary neg pulmonary ROS,    Pulmonary exam normal breath sounds clear to auscultation       Cardiovascular hypertension, + CAD  Normal cardiovascular exam Rhythm:Regular Rate:Normal     Neuro/Psych negative neurological ROS  negative psych ROS   GI/Hepatic negative GI ROS, Neg liver ROS,   Endo/Other  negative endocrine ROS  Renal/GU Renal InsufficiencyRenal disease  negative genitourinary   Musculoskeletal  (+) Arthritis , Osteoarthritis,    Abdominal   Peds negative pediatric ROS (+)  Hematology negative hematology ROS (+)   Anesthesia Other Findings   Reproductive/Obstetrics negative OB ROS                            Anesthesia Physical Anesthesia Plan  ASA: 3  Anesthesia Plan: Spinal   Post-op Pain Management:    Induction: Intravenous  PONV Risk Score and Plan: 1 and Ondansetron and Treatment may vary due to age or medical condition  Airway Management Planned: Simple Face Mask  Additional Equipment:   Intra-op Plan:   Post-operative Plan:   Informed Consent: I have reviewed the patients History and Physical, chart, labs and discussed the procedure including the risks, benefits and alternatives for the proposed anesthesia with the patient or authorized representative who has indicated his/her understanding and acceptance.     Dental advisory given  Plan Discussed with: CRNA  Anesthesia Plan Comments: (See PAT note 01/12/2021, Konrad Felix, PA-C)       Anesthesia Quick Evaluation

## 2021-01-13 NOTE — Progress Notes (Signed)
Anesthesia Chart Review   Case: [id number] Date/Time: 01/14/21 1345   Procedure: TOTAL HIP ARTHROPLASTY ANTERIOR APPROACH (Right: Hip)   Anesthesia type: Choice   Pre-op diagnosis: Right hip osteoarthritis   Location: [facility] ROOM 09 / [facility]   Surgeons: Gaynelle Arabian, MD       DISCUSSION:87 y.o. never smoker with h/o HTN, CKD, CAD (CABG), DVT, SVT, right hip OA scheduled for above procedure 01/14/21 with Dr. Gaynelle Arabian.   Pt last seen by cardiology 12/23/2020. Per OV note, "He anticipates total hip arthroplasty on the right. No chest pain palpitation shortness of breath or syncope. From my perspective he is optimized for his planned surgical procedure."  Anticipate pt can proceed with planned procedure barring acute status change.   VS: BP 133/75   Pulse 74   Temp 36.7 C (Oral)   Resp 20   Ht 5\' 9"  (1.753 m)   Wt 74.4 kg   SpO2 100%   BMI 24.22 kg/m   PROVIDERS: Angelina Sheriff, MD is PCP   Shirlee More, MD is Cardiologist  LABS: Labs reviewed: Acceptable for surgery. (all labs ordered are listed, but only abnormal results are displayed)  Labs Reviewed  CBC - Abnormal; Notable for the following components:      Result Value   RBC 3.96 (*)    Hemoglobin 12.4 (*)    HCT 37.7 (*)    All other components within normal limits  COMPREHENSIVE METABOLIC PANEL - Abnormal; Notable for the following components:   Sodium 133 (*)    BUN 29 (*)    GFR, Estimated 59 (*)    All other components within normal limits  SURGICAL PCR SCREEN  PROTIME-INR  TYPE AND SCREEN     IMAGES:   EKG:   CV: Echo 12/12/2020  1. Left ventricular ejection fraction, by estimation, is 60 to 65%. The  left ventricle has normal function. The left ventricle has no regional  wall motion abnormalities. There is mild left ventricular hypertrophy.   2. Right ventricular systolic function is normal. The right ventricular  size is normal. There is normal pulmonary artery systolic pressure.   3.  The mitral valve is normal in structure. Mild mitral valve  regurgitation. No evidence of mitral stenosis.   4. The aortic valve is normal in structure. Aortic valve regurgitation is  not visualized. Mild aortic valve sclerosis is present, with no evidence  of aortic valve stenosis.   5. Descending aorta 2.3 cm.   6. The inferior vena cava is normal in size with greater than 50%  respiratory variability, suggesting right atrial pressure of 3 mmHg.  Past Medical History:  Diagnosis Date   Abnormal stress echocardiogram 08/16/2015   Overview:  With post exercise LV dilation   Arthritis 04/14/2017   Chronic ischemic heart disease    CKD (chronic kidney disease) 04/14/2017   Coronary artery disease involving native coronary artery of native heart 04/14/2017   Coronary artery disease with stable angina pectoris ([place]) 09/01/2015   pt denies chest pain   DVT (deep venous thrombosis) ([place])    Right leg   Dyspnea 2017   Essential hypertension 07/22/2015   Gout 04/14/2017   Hyperlipidemia 07/22/2015   Incomplete right bundle branch block (RBBB)    Noted on EKG 01/31/2018   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   Leukocytosis 04/14/2017   Nocturia    Obesity 04/14/2017   On amiodarone therapy 09/01/2015   PAF (paroxysmal atrial fibrillation) ([place]) 09/01/2015   pt  unaware   Pleural effusion 09/22/2015   Overview:  Left, post CABG with US guided thoracentesis   Premature ventricular contractions (PVCs) (VPCs) 04/14/2017   S/P CABG (coronary artery bypass graft) 04/14/2017   Skin cancer (melanoma) ([place]) 04/14/2017   Urinary frequency    Urinary urgency    Venous insufficiency    Vitamin B 12 deficiency     Past Surgical History:  Procedure Laterality Date   CARDIAC CATHETERIZATION  09/2015   COLONOSCOPY     CORNEAL TRANSPLANT Bilateral    CORONARY ARTERY BYPASS GRAFT  08/26/2015   FOOT SURGERY Right    TOTAL KNEE ARTHROPLASTY Right 08/14/2018   Procedure: TOTAL KNEE ARTHROPLASTY;   Surgeon: Vickey Huger, MD;  Location: [facility];  Service: Orthopedics;  Laterality: Right;    MEDICATIONS:  allopurinol (ZYLOPRIM) 100 MG tablet   aspirin EC 81 MG tablet   ezetimibe (ZETIA) 10 MG tablet   furosemide (LASIX) 40 MG tablet   loteprednol (LOTEMAX) 0.5 % ophthalmic suspension   Multiple Vitamin (MULTIVITAMIN) capsule   spironolactone (ALDACTONE) 25 MG tablet   No current facility-administered medications for this encounter.    Konrad Felix, PA-C [facility]Surgical Testing [phone]

## 2021-01-13 NOTE — Progress Notes (Signed)
Spoke with pts daughter Nolene Bernheim in regards to pt arriving at [facility] admitting at 11:30am on 01/14/2021 for scheduled surgical procedure. No food after midnight; clear liquids from midnight till 11 am consuming entire presurgery drink by 11:00 am then nothing by mouth.

## 2021-01-14 ENCOUNTER — Encounter (HOSPITAL_COMMUNITY): Payer: Self-pay | Admitting: Orthopedic Surgery

## 2021-01-14 ENCOUNTER — Ambulatory Visit (HOSPITAL_COMMUNITY): Payer: Medicare Other

## 2021-01-14 ENCOUNTER — Ambulatory Visit (HOSPITAL_COMMUNITY): Payer: Medicare Other | Admitting: Certified Registered"

## 2021-01-14 ENCOUNTER — Ambulatory Visit (HOSPITAL_COMMUNITY): Payer: Medicare Other | Admitting: Physician Assistant

## 2021-01-14 ENCOUNTER — Observation Stay (HOSPITAL_COMMUNITY): Payer: Medicare Other

## 2021-01-14 ENCOUNTER — Observation Stay (HOSPITAL_COMMUNITY)
Admission: RE | Admit: 2021-01-14 | Discharge: 2021-01-15 | Disposition: A | Discharge status: Home / Self Care | Payer: Medicare Other | Attending: Orthopedic Surgery | Admitting: Orthopedic Surgery

## 2021-01-14 ENCOUNTER — Encounter (HOSPITAL_COMMUNITY)
Admission: RE | Disposition: A | Discharge status: Home / Self Care | Payer: Self-pay | Source: Home / Self Care | Attending: Orthopedic Surgery

## 2021-01-14 DIAGNOSIS — Z96641 Presence of right artificial hip joint: Secondary | ICD-10-CM | POA: Diagnosis not present

## 2021-01-14 DIAGNOSIS — Z79899 Other long term (current) drug therapy: Secondary | ICD-10-CM | POA: Insufficient documentation

## 2021-01-14 DIAGNOSIS — I2510 Atherosclerotic heart disease of native coronary artery without angina pectoris: Secondary | ICD-10-CM | POA: Diagnosis not present

## 2021-01-14 DIAGNOSIS — E785 Hyperlipidemia, unspecified: Secondary | ICD-10-CM | POA: Diagnosis not present

## 2021-01-14 DIAGNOSIS — I493 Ventricular premature depolarization: Secondary | ICD-10-CM | POA: Diagnosis not present

## 2021-01-14 DIAGNOSIS — S72041A Displaced fracture of base of neck of right femur, initial encounter for closed fracture: Secondary | ICD-10-CM

## 2021-01-14 DIAGNOSIS — M1611 Unilateral primary osteoarthritis, right hip: Principal | ICD-10-CM | POA: Diagnosis present

## 2021-01-14 DIAGNOSIS — Z951 Presence of aortocoronary bypass graft: Secondary | ICD-10-CM | POA: Insufficient documentation

## 2021-01-14 DIAGNOSIS — Z7982 Long term (current) use of aspirin: Secondary | ICD-10-CM | POA: Insufficient documentation

## 2021-01-14 DIAGNOSIS — Z96651 Presence of right artificial knee joint: Secondary | ICD-10-CM | POA: Diagnosis not present

## 2021-01-14 DIAGNOSIS — Z85828 Personal history of other malignant neoplasm of skin: Secondary | ICD-10-CM | POA: Insufficient documentation

## 2021-01-14 DIAGNOSIS — N189 Chronic kidney disease, unspecified: Secondary | ICD-10-CM | POA: Insufficient documentation

## 2021-01-14 DIAGNOSIS — Z471 Aftercare following joint replacement surgery: Secondary | ICD-10-CM | POA: Diagnosis not present

## 2021-01-14 DIAGNOSIS — I480 Paroxysmal atrial fibrillation: Secondary | ICD-10-CM | POA: Diagnosis not present

## 2021-01-14 DIAGNOSIS — Z96649 Presence of unspecified artificial hip joint: Secondary | ICD-10-CM

## 2021-01-14 DIAGNOSIS — M169 Osteoarthritis of hip, unspecified: Secondary | ICD-10-CM | POA: Diagnosis present

## 2021-01-14 DIAGNOSIS — I129 Hypertensive chronic kidney disease with stage 1 through stage 4 chronic kidney disease, or unspecified chronic kidney disease: Secondary | ICD-10-CM | POA: Diagnosis not present

## 2021-01-14 HISTORY — PX: TOTAL HIP ARTHROPLASTY: SHX124

## 2021-01-14 LAB — ABO/RH: ABO/RH(D): A POS

## 2021-01-14 LAB — TYPE AND SCREEN
ABO/RH(D): A POS
Antibody Screen: NEGATIVE

## 2021-01-14 SURGERY — ARTHROPLASTY, HIP, TOTAL, ANTERIOR APPROACH
Anesthesia: Spinal | Site: Hip | Laterality: Right

## 2021-01-14 MED ORDER — BUPIVACAINE-EPINEPHRINE (PF) 0.25% -1:200000 IJ SOLN
INTRAMUSCULAR | Status: AC
  Filled 2021-01-14: qty 30

## 2021-01-14 MED ORDER — LACTATED RINGERS IV SOLN: INTRAVENOUS | Status: DC

## 2021-01-14 MED ORDER — PHENOL 1.4 % MT LIQD: 1.0000 | OROMUCOSAL | Status: DC | PRN

## 2021-01-14 MED ORDER — TRAMADOL HCL 50 MG PO TABS
50.0000 mg | ORAL_TABLET | Freq: Four times a day (QID) | ORAL | Status: DC | PRN
  Administered 2021-01-15: 100 mg via ORAL
  Filled 2021-01-14: qty 2

## 2021-01-14 MED ORDER — DOCUSATE SODIUM 100 MG PO CAPS
100.0000 mg | ORAL_CAPSULE | Freq: Two times a day (BID) | ORAL | Status: DC
  Administered 2021-01-14 – 2021-01-15 (×2): 100 mg via ORAL
  Filled 2021-01-14 (×2): qty 1

## 2021-01-14 MED ORDER — BUPIVACAINE-EPINEPHRINE (PF) 0.25% -1:200000 IJ SOLN
INTRAMUSCULAR | Status: DC | PRN
  Administered 2021-01-14: 30 mL

## 2021-01-14 MED ORDER — METHOCARBAMOL 1000 MG/10ML IJ SOLN
500.0000 mg | Freq: Four times a day (QID) | INTRAVENOUS | Status: DC | PRN
  Filled 2021-01-14: qty 5

## 2021-01-14 MED ORDER — ONDANSETRON HCL 4 MG PO TABS: 4.0000 mg | ORAL_TABLET | Freq: Four times a day (QID) | ORAL | Status: DC | PRN

## 2021-01-14 MED ORDER — BISACODYL 10 MG RE SUPP: 10.0000 mg | Freq: Every day | RECTAL | Status: DC | PRN

## 2021-01-14 MED ORDER — POLYETHYLENE GLYCOL 3350 17 G PO PACK: 17.0000 g | PACK | Freq: Every day | ORAL | Status: DC | PRN

## 2021-01-14 MED ORDER — EPHEDRINE 5 MG/ML INJ
INTRAVENOUS | Status: AC
  Filled 2021-01-14: qty 10

## 2021-01-14 MED ORDER — ACETAMINOPHEN 325 MG PO TABS: 325.0000 mg | ORAL_TABLET | Freq: Four times a day (QID) | ORAL | Status: DC | PRN

## 2021-01-14 MED ORDER — PROPOFOL 500 MG/50ML IV EMUL
INTRAVENOUS | Status: AC
  Filled 2021-01-14: qty 50

## 2021-01-14 MED ORDER — PHENYLEPHRINE 40 MCG/ML (10ML) SYRINGE FOR IV PUSH (FOR BLOOD PRESSURE SUPPORT)
PREFILLED_SYRINGE | INTRAVENOUS | Status: AC
  Filled 2021-01-14: qty 10

## 2021-01-14 MED ORDER — HYDROCODONE-ACETAMINOPHEN 5-325 MG PO TABS
1.0000 | ORAL_TABLET | ORAL | Status: DC | PRN
  Administered 2021-01-14 – 2021-01-15 (×4): 2 via ORAL
  Filled 2021-01-14 (×4): qty 2

## 2021-01-14 MED ORDER — ORAL CARE MOUTH RINSE: 15.0000 mL | Freq: Once | OROMUCOSAL | Status: AC

## 2021-01-14 MED ORDER — PROPOFOL 10 MG/ML IV BOLUS
INTRAVENOUS | Status: DC | PRN
  Administered 2021-01-14: 30 mg via INTRAVENOUS

## 2021-01-14 MED ORDER — WATER FOR IRRIGATION, STERILE IR SOLN
Status: DC | PRN
  Administered 2021-01-14: 2000 mL

## 2021-01-14 MED ORDER — TRANEXAMIC ACID 1000 MG/10ML IV SOLN
2000.0000 mg | Freq: Once | INTRAVENOUS | Status: DC
  Filled 2021-01-14: qty 20

## 2021-01-14 MED ORDER — METOCLOPRAMIDE HCL 5 MG/ML IJ SOLN: 5.0000 mg | Freq: Three times a day (TID) | INTRAMUSCULAR | Status: DC | PRN

## 2021-01-14 MED ORDER — MORPHINE SULFATE (PF) 2 MG/ML IV SOLN
0.5000 mg | INTRAVENOUS | Status: DC | PRN
  Administered 2021-01-14: 1 mg via INTRAVENOUS
  Filled 2021-01-14: qty 1

## 2021-01-14 MED ORDER — BUPIVACAINE IN DEXTROSE 0.75-8.25 % IT SOLN
INTRATHECAL | Status: DC | PRN
  Administered 2021-01-14: 1.8 mL via INTRATHECAL

## 2021-01-14 MED ORDER — LOTEPREDNOL ETABONATE 0.5 % OP SUSP
1.0000 [drp] | Freq: Every morning | OPHTHALMIC | Status: DC
  Filled 2021-01-14: qty 5

## 2021-01-14 MED ORDER — CEFAZOLIN SODIUM-DEXTROSE 2-4 GM/100ML-% IV SOLN
2.0000 g | Freq: Four times a day (QID) | INTRAVENOUS | Status: AC
  Administered 2021-01-14 – 2021-01-15 (×2): 2 g via INTRAVENOUS
  Filled 2021-01-14 (×2): qty 100

## 2021-01-14 MED ORDER — OXYCODONE HCL 5 MG/5ML PO SOLN: 5.0000 mg | Freq: Once | ORAL | Status: DC | PRN

## 2021-01-14 MED ORDER — SODIUM CHLORIDE 0.9 % IV SOLN: INTRAVENOUS | Status: DC

## 2021-01-14 MED ORDER — FENTANYL CITRATE (PF) 100 MCG/2ML IJ SOLN
INTRAMUSCULAR | Status: DC | PRN
  Administered 2021-01-14 (×2): 50 ug via INTRAVENOUS

## 2021-01-14 MED ORDER — MENTHOL 3 MG MT LOZG: 1.0000 | LOZENGE | OROMUCOSAL | Status: DC | PRN

## 2021-01-14 MED ORDER — PROMETHAZINE HCL 25 MG/ML IJ SOLN: 6.2500 mg | INTRAMUSCULAR | Status: DC | PRN

## 2021-01-14 MED ORDER — 0.9 % SODIUM CHLORIDE (POUR BTL) OPTIME
TOPICAL | Status: DC | PRN
  Administered 2021-01-14: 1000 mL

## 2021-01-14 MED ORDER — POVIDONE-IODINE 10 % EX SWAB
2.0000 "application " | Freq: Once | CUTANEOUS | Status: AC
  Administered 2021-01-14: 2 via TOPICAL

## 2021-01-14 MED ORDER — DEXAMETHASONE SODIUM PHOSPHATE 10 MG/ML IJ SOLN
10.0000 mg | Freq: Once | INTRAMUSCULAR | Status: AC
  Administered 2021-01-15: 10 mg via INTRAVENOUS
  Filled 2021-01-14: qty 1

## 2021-01-14 MED ORDER — EZETIMIBE 10 MG PO TABS: 10.0000 mg | ORAL_TABLET | Freq: Every day | ORAL | Status: DC

## 2021-01-14 MED ORDER — MAGNESIUM CITRATE PO SOLN: 1.0000 | Freq: Once | ORAL | Status: DC | PRN

## 2021-01-14 MED ORDER — PHENYLEPHRINE 40 MCG/ML (10ML) SYRINGE FOR IV PUSH (FOR BLOOD PRESSURE SUPPORT)
PREFILLED_SYRINGE | INTRAVENOUS | Status: DC | PRN
  Administered 2021-01-14 (×3): 80 ug via INTRAVENOUS

## 2021-01-14 MED ORDER — FUROSEMIDE 40 MG PO TABS
40.0000 mg | ORAL_TABLET | Freq: Every day | ORAL | Status: DC
  Administered 2021-01-15: 40 mg via ORAL
  Filled 2021-01-14: qty 1

## 2021-01-14 MED ORDER — PROPOFOL 500 MG/50ML IV EMUL
INTRAVENOUS | Status: DC | PRN
  Administered 2021-01-14: 75 ug/kg/min via INTRAVENOUS

## 2021-01-14 MED ORDER — ONDANSETRON HCL 4 MG/2ML IJ SOLN
INTRAMUSCULAR | Status: AC
  Filled 2021-01-14: qty 2

## 2021-01-14 MED ORDER — METOCLOPRAMIDE HCL 5 MG PO TABS: 5.0000 mg | ORAL_TABLET | Freq: Three times a day (TID) | ORAL | Status: DC | PRN

## 2021-01-14 MED ORDER — RIVAROXABAN 10 MG PO TABS
10.0000 mg | ORAL_TABLET | Freq: Every day | ORAL | Status: DC
  Administered 2021-01-15: 10 mg via ORAL
  Filled 2021-01-14: qty 1

## 2021-01-14 MED ORDER — DEXAMETHASONE SODIUM PHOSPHATE 10 MG/ML IJ SOLN
8.0000 mg | Freq: Once | INTRAMUSCULAR | Status: AC
  Administered 2021-01-14: 8 mg via INTRAVENOUS

## 2021-01-14 MED ORDER — FENTANYL CITRATE (PF) 100 MCG/2ML IJ SOLN
INTRAMUSCULAR | Status: AC
  Filled 2021-01-14: qty 2

## 2021-01-14 MED ORDER — SPIRONOLACTONE 25 MG PO TABS
25.0000 mg | ORAL_TABLET | Freq: Every day | ORAL | Status: DC
  Administered 2021-01-15: 25 mg via ORAL
  Filled 2021-01-14: qty 1

## 2021-01-14 MED ORDER — ONDANSETRON HCL 4 MG/2ML IJ SOLN
INTRAMUSCULAR | Status: DC | PRN
  Administered 2021-01-14: 4 mg via INTRAVENOUS

## 2021-01-14 MED ORDER — ONDANSETRON HCL 4 MG/2ML IJ SOLN: 4.0000 mg | Freq: Four times a day (QID) | INTRAMUSCULAR | Status: DC | PRN

## 2021-01-14 MED ORDER — ALLOPURINOL 100 MG PO TABS
100.0000 mg | ORAL_TABLET | Freq: Two times a day (BID) | ORAL | Status: DC
  Administered 2021-01-14 – 2021-01-15 (×2): 100 mg via ORAL
  Filled 2021-01-14 (×2): qty 1

## 2021-01-14 MED ORDER — TRANEXAMIC ACID 1000 MG/10ML IV SOLN: 1000.0000 mg | Freq: Once | INTRAVENOUS | Status: DC

## 2021-01-14 MED ORDER — DEXAMETHASONE SODIUM PHOSPHATE 10 MG/ML IJ SOLN
INTRAMUSCULAR | Status: AC
  Filled 2021-01-14: qty 1

## 2021-01-14 MED ORDER — ACETAMINOPHEN 10 MG/ML IV SOLN
1000.0000 mg | Freq: Four times a day (QID) | INTRAVENOUS | Status: DC
  Administered 2021-01-14: 1000 mg via INTRAVENOUS
  Filled 2021-01-14: qty 100

## 2021-01-14 MED ORDER — HYDROMORPHONE HCL 1 MG/ML IJ SOLN: 0.2500 mg | INTRAMUSCULAR | Status: DC | PRN

## 2021-01-14 MED ORDER — OXYCODONE HCL 5 MG PO TABS: 5.0000 mg | ORAL_TABLET | Freq: Once | ORAL | Status: DC | PRN

## 2021-01-14 MED ORDER — METHOCARBAMOL 500 MG PO TABS
500.0000 mg | ORAL_TABLET | Freq: Four times a day (QID) | ORAL | Status: DC | PRN
  Administered 2021-01-15: 500 mg via ORAL
  Filled 2021-01-14: qty 1

## 2021-01-14 MED ORDER — CEFAZOLIN SODIUM-DEXTROSE 2-4 GM/100ML-% IV SOLN
2.0000 g | INTRAVENOUS | Status: AC
  Administered 2021-01-14: 2 g via INTRAVENOUS
  Filled 2021-01-14: qty 100

## 2021-01-14 MED ORDER — CHLORHEXIDINE GLUCONATE 0.12 % MT SOLN
15.0000 mL | Freq: Once | OROMUCOSAL | Status: AC
  Administered 2021-01-14: 15 mL via OROMUCOSAL

## 2021-01-14 MED ORDER — EPHEDRINE SULFATE-NACL 50-0.9 MG/10ML-% IV SOSY
PREFILLED_SYRINGE | INTRAVENOUS | Status: DC | PRN
  Administered 2021-01-14 (×3): 10 mg via INTRAVENOUS

## 2021-01-14 SURGICAL SUPPLY — 42 items
BAG DECANTER FOR FLEXI CONT (MISCELLANEOUS) IMPLANT
BAG ZIPLOCK 12X15 (MISCELLANEOUS) IMPLANT
BLADE SAG 18X100X1.27 (BLADE) ×2 IMPLANT
CLSR STERI-STRIP ANTIMIC 1/2X4 (GAUZE/BANDAGES/DRESSINGS) ×2 IMPLANT
COVER PERINEAL POST (MISCELLANEOUS) ×2 IMPLANT
COVER SURGICAL LIGHT HANDLE (MISCELLANEOUS) ×2 IMPLANT
COVER WAND RF STERILE (DRAPES) IMPLANT
CUP ACETBLR 52 OD PINNACLE (Hips) ×2 IMPLANT
DECANTER SPIKE VIAL GLASS SM (MISCELLANEOUS) IMPLANT
DRAPE FOOT SWITCH (DRAPES) ×2 IMPLANT
DRAPE STERI IOBAN 125X83 (DRAPES) ×2 IMPLANT
DRAPE U-SHAPE 47X51 STRL (DRAPES) ×4 IMPLANT
DRSG AQUACEL AG ADV 3.5X10 (GAUZE/BANDAGES/DRESSINGS) ×2 IMPLANT
DURAPREP 26ML APPLICATOR (WOUND CARE) ×2 IMPLANT
ELECT REM PT RETURN 15FT ADLT (MISCELLANEOUS) ×2 IMPLANT
GLOVE SRG 8 PF TXTR STRL LF DI (GLOVE) ×1 IMPLANT
GLOVE SURG ENC MOIS LTX SZ6.5 (GLOVE) ×2 IMPLANT
GLOVE SURG ENC MOIS LTX SZ7 (GLOVE) ×2 IMPLANT
GLOVE SURG ENC MOIS LTX SZ8 (GLOVE) ×4 IMPLANT
GLOVE SURG UNDER POLY LF SZ7 (GLOVE) ×2 IMPLANT
GLOVE SURG UNDER POLY LF SZ8 (GLOVE) ×1
GLOVE SURG UNDER POLY LF SZ8.5 (GLOVE) IMPLANT
GOWN STRL REUS W/TWL LRG LVL3 (GOWN DISPOSABLE) ×4 IMPLANT
GOWN STRL REUS W/TWL XL LVL3 (GOWN DISPOSABLE) IMPLANT
HEAD FEM STD 32X+1 STRL (Hips) ×2 IMPLANT
HOLDER FOLEY CATH W/STRAP (MISCELLANEOUS) ×2 IMPLANT
KIT TURNOVER KIT A (KITS) ×2 IMPLANT
LINER MARATHON NEUT +4X52X32 (Hips) ×2 IMPLANT
MANIFOLD NEPTUNE II (INSTRUMENTS) ×2 IMPLANT
PACK ANTERIOR HIP CUSTOM (KITS) ×2 IMPLANT
PENCIL SMOKE EVACUATOR COATED (MISCELLANEOUS) ×2 IMPLANT
STEM FEMORAL SZ 6MM STD ACTIS (Stem) ×2 IMPLANT
STRIP CLOSURE SKIN 1/2X4 (GAUZE/BANDAGES/DRESSINGS) ×4 IMPLANT
SUT ETHIBOND NAB CT1 #1 30IN (SUTURE) ×2 IMPLANT
SUT MNCRL AB 4-0 PS2 18 (SUTURE) ×2 IMPLANT
SUT STRATAFIX 0 PDS 27 VIOLET (SUTURE) ×2
SUT VIC AB 2-0 CT1 27 (SUTURE) ×2
SUT VIC AB 2-0 CT1 TAPERPNT 27 (SUTURE) ×2 IMPLANT
SUTURE STRATFX 0 PDS 27 VIOLET (SUTURE) ×1 IMPLANT
SYR 50ML LL SCALE MARK (SYRINGE) IMPLANT
TRAY FOLEY MTR SLVR 16FR STAT (SET/KITS/TRAYS/PACK) ×2 IMPLANT
TUBE SUCTION HIGH CAP CLEAR NV (SUCTIONS) ×2 IMPLANT

## 2021-01-14 NOTE — Interval H&P Note (Signed)
History and Physical Interval Note:  01/14/2021 12:57 PM  [facility]  has presented today for surgery, with the diagnosis of Right hip osteoarthritis.  The various methods of treatment have been discussed with the patient and family. After consideration of risks, benefits and other options for treatment, the patient has consented to  Procedure(s): TOTAL HIP ARTHROPLASTY ANTERIOR APPROACH (Right) as a surgical intervention.  The patient's history has been reviewed, patient examined, no change in status, stable for surgery.  I have reviewed the patient's chart and labs.  Questions were answered to the patient's satisfaction.     Pilar Plate [name]

## 2021-01-14 NOTE — Anesthesia Procedure Notes (Signed)
Spinal  Patient location during procedure: OR End time: 01/14/2021 2:07 PM Reason for block: surgical anesthesia Staffing Performed: anesthesiologist  Resident/CRNA: Noralyn Pick D, CRNA Preanesthetic Checklist Completed: patient identified, IV checked, site marked, risks and benefits discussed, surgical consent, monitors and equipment checked, pre-op evaluation and timeout performed Spinal Block Patient position: sitting Prep: DuraPrep Patient monitoring: heart rate, continuous pulse ox and blood pressure Approach: midline Location: L3-4 Injection technique: single-shot Needle Needle type: Sprotte  Needle gauge: 24 G Needle length: 9 cm Assessment Sensory level: T6 Events: CSF return Additional Notes Expiration date of kit checked and confirmed. Patient tolerated procedure well, without complications.

## 2021-01-14 NOTE — H&P (Signed)
TOTAL HIP ADMISSION H&P  Patient is admitted for right total hip arthroplasty.  Subjective:  Chief Complaint: Right hip pain  HPI: Ronald Dunn, 87 y.o. male, has a history of pain and functional disability in the right hip due to arthritis and avascular necrosis  and patient has failed non-surgical conservative treatments for greater than 12 weeks to include NSAID's and/or analgesics and activity modification. Onset of symptoms was gradual, starting  several  years ago with gradually worsening course since that time. The patient noted no past surgery on the right hip. Patient currently rates pain in the right hip at 6 out of 10 with activity. Patient has worsening of pain with activity and weight bearing and pain that interfers with activities of daily living. Patient has evidence of periarticular osteophytes and joint space narrowing by imaging studies. This condition presents safety issues increasing the risk of falls. There is no current active infection.  Patient Active Problem List   Diagnosis Date Noted   Vitamin B 12 deficiency    Urinary frequency    Urinary urgency    Venous insufficiency    Nocturia    Incomplete right bundle branch block (RBBB)    DVT (deep venous thrombosis) (HCC)    Chronic ischemic heart disease    S/P right knee arthroscopy 10/14/2020   Patellar clunk syndrome of right knee 09/30/2020   Tachycardia 06/23/2020   Lower extremity edema 09/27/2018   Status post total right knee replacement 08/14/2018   Borderline anemia 05/2018   Chronic pain of both knees 05/16/2018   Coronary artery disease involving native coronary artery of native heart 04/14/2017   Leukocytosis 04/14/2017   S/P CABG (coronary artery bypass graft) 04/14/2017   CKD (chronic kidney disease) 04/14/2017   Arthritis 04/14/2017   Skin cancer (melanoma) ([place]) 04/14/2017   Gout 04/14/2017   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   Obesity 04/14/2017   Premature ventricular contractions  (PVCs) (VPCs) 04/14/2017   Pleural effusion 09/22/2015   Coronary artery disease with stable angina pectoris ([place]) 09/01/2015   On amiodarone therapy 09/01/2015   PAF (paroxysmal atrial fibrillation) ([place]) 09/01/2015   Abnormal stress echocardiogram 08/16/2015   Dyspnea 2017   Essential hypertension 07/22/2015   Hyperlipidemia 07/22/2015    Past Medical History:  Diagnosis Date   Abnormal stress echocardiogram 08/16/2015   Overview:  With post exercise LV dilation   Arthritis 04/14/2017   Chronic ischemic heart disease    CKD (chronic kidney disease) 04/14/2017   Coronary artery disease involving native coronary artery of native heart 04/14/2017   Coronary artery disease with stable angina pectoris ([place]) 09/01/2015   pt denies chest pain   DVT (deep venous thrombosis) (HCC)    Right leg   Dyspnea 2017   Essential hypertension 07/22/2015   Gout 04/14/2017   Hyperlipidemia 07/22/2015   Incomplete right bundle branch block (RBBB)    Noted on EKG 01/31/2018   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   Leukocytosis 04/14/2017   Nocturia    Obesity 04/14/2017   On amiodarone therapy 09/01/2015   PAF (paroxysmal atrial fibrillation) ([place]) 09/01/2015   pt unaware   Pleural effusion 09/22/2015   Overview:  Left, post CABG with US guided thoracentesis   Premature ventricular contractions (PVCs) (VPCs) 04/14/2017   S/P CABG (coronary artery bypass graft) 04/14/2017   Skin cancer (melanoma) ([place]) 04/14/2017   Urinary frequency    Urinary urgency    Venous insufficiency    Vitamin B 12 deficiency  Past Surgical History:  Procedure Laterality Date   CARDIAC CATHETERIZATION  09/2015   COLONOSCOPY     CORNEAL TRANSPLANT Bilateral    CORONARY ARTERY BYPASS GRAFT  08/26/2015   FOOT SURGERY Right    TOTAL KNEE ARTHROPLASTY Right 08/14/2018   Procedure: TOTAL KNEE ARTHROPLASTY;  Surgeon: Vickey Huger, MD;  Location: [facility];  Service: Orthopedics;  Laterality: Right;    Prior to  Admission medications   Medication Sig Start Date End Date Taking? Authorizing Provider  allopurinol (ZYLOPRIM) 100 MG tablet Take 100 mg by mouth 2 (two) times daily.    Yes [provider]  aspirin EC 81 MG tablet Take 81 mg by mouth daily.   Yes [provider]  ezetimibe (ZETIA) 10 MG tablet Take 1 tablet by mouth once daily Patient taking differently: Take 10 mg by mouth daily. 12/16/20  Yes Tobb, Kardie, DO  furosemide (LASIX) 40 MG tablet Take 1 tablet (40 mg total) by mouth daily. 12/12/20  Yes Richardo Priest, MD  loteprednol (LOTEMAX) 0.5 % ophthalmic suspension Place 1 drop into both eyes every morning.    Yes [provider]  Multiple Vitamin (MULTIVITAMIN) capsule Take 1 capsule by mouth daily.    Yes [provider]  spironolactone (ALDACTONE) 25 MG tablet Take 1 tablet by mouth once daily Patient taking differently: Take 25 mg by mouth daily. 11/03/20  Yes Richardo Priest, MD    No Known Allergies  Social History   Socioeconomic History   Marital status: Married    Spouse name: Not on file   Number of children: Not on file   Years of education: Not on file   Highest education level: Not on file  Occupational History   Not on file  Tobacco Use   Smoking status: Never   Smokeless tobacco: Never  Vaping Use   Vaping Use: Never used  Substance and Sexual Activity   Alcohol use: Not Currently    Alcohol/week: 1.0 standard drink    Types: 1 Glasses of wine per week   Drug use: No   Sexual activity: Not on file  Other Topics Concern   Not on file  Social History Narrative   Not on file   Social Determinants of Health   Financial Resource Strain: Not on file  Food Insecurity: Not on file  Transportation Needs: Not on file  Physical Activity: Not on file  Stress: Not on file  Social Connections: Not on file  Intimate Partner Violence: Not on file    Tobacco Use: Low Risk    Smoking Tobacco Use: Never   Smokeless Tobacco Use:  Never   Social History   Substance and Sexual Activity  Alcohol Use Not Currently   Alcohol/week: 1.0 standard drink   Types: 1 Glasses of wine per week    Family History  Problem Relation Age of Onset   Heart failure Father    Heart disease Father     ROS: Constitutional: no fever, no chills, no night sweats, no significant weight loss Cardiovascular: no chest pain, no palpitations Respiratory: no cough, no shortness of breath, No COPD Gastrointestinal: no vomiting, no nausea Musculoskeletal: no swelling in Joints, Joint Pain Neurologic: no numbness, no tingling, no difficulty with balance    Objective:  Physical Exam: General: Alert and oriented x3, cooperative and pleasant, no acute distress.  Head: normocephalic, atraumatic, neck supple.  Eyes: EOMI.  Respiratory: breath sounds clear in all fields, no wheezing, rales, or rhonchi. Cardiovascular: Regular  rate and rhythm, no murmurs, gallops or rubs.  Abdomen: non-tender to palpation and soft, normoactive bowel sounds. Musculoskeletal:  The patient is sitting in a chair in no apparent distress.   Right Hip Exam:   The range of motion: Flexion to 100 degrees, Internal Rotation is minimal, External Rotation to 10 to 20 degrees, and abduction to 20 degrees with audible crepitus on range of motion of the hip.   There is no tenderness over the greater trochanteric bursa.   Right Knee Exam:   Well-healed scar from a previous right total knee arthroplasty.   No effusion present. No swelling present.   The range of motion is: 0 to 125 degrees.   No crepitus on range of motion of the knee.   No medial joint line tenderness.   No lateral joint line tenderness.   The knee is stable.   Calves soft and nontender. Motor function intact in LE. Strength 5/5 LE bilaterally. Neuro: Distal pulses 2+. Sensation to light touch intact in LE.  Vital signs in last 24 hours:    Imaging Review Radiographs- AP pelvis, AP and lateral  of the right hip from last month demonstrate severe end-stage osteoarthritis of the right hip with collapse of the femoral head and almost has an appearance of avascular necrosis. He has mild arthritis in the left hip.  Assessment/Plan:  End stage arthritis, right hip  The patient history, physical examination, clinical judgement of the provider and imaging studies are consistent with end stage degenerative joint disease of the right hip and total hip arthroplasty is deemed medically necessary. The treatment options including medical management, injection therapy, arthroscopy and arthroplasty were discussed at length. The risks and benefits of total hip arthroplasty were presented and reviewed. The risks due to aseptic loosening, infection, stiffness, dislocation/subluxation, thromboembolic complications and other imponderables were discussed. The patient acknowledged the explanation, agreed to proceed with the plan and consent was signed. Patient is being admitted for inpatient treatment for surgery, pain control, PT, OT, prophylactic antibiotics, VTE prophylaxis, progressive ambulation and ADLs and discharge planning.The patient is planning to be discharged  home .   Patient's anticipated LOS is less than 2 midnights, meeting these requirements: - Lives within 1 hour of care - Has a competent adult at home to recover with post-op recovery - NO history of  - Chronic pain requiring opioids  - Diabetes  - Coronary Artery Disease  - Heart failure  - Heart attack  - Stroke  - DVT/VTE  - Cardiac arrhythmia  - Respiratory Failure/COPD  - Renal failure  - Anemia  - Advanced Liver disease   Therapy Plans: HEP Disposition: Home with Wife Planned DVT Prophylaxis: Xarelto 10mg  (History of Blood Clot) DME Needed: None PCP: Dr. Lin Landsman  Cardiology: Dr. Bettina Gavia (clearance received) TXA: Topical Allergies: NKDA Anesthesia Concerns: None BMI: 25.3 Last HgbA1c: N/A  Pharmacy: [facility] in [place]  - Patient was instructed on what medications to stop prior to surgery. - Follow-up visit in 2 weeks with Dr. Wynelle Link - Begin physical therapy following surgery - Pre-operative lab work as pre-surgical testing - Prescriptions will be provided in hospital at time of discharge  Fenton Foy, [facility], PA-C Orthopedic Surgery [facility] [place]

## 2021-01-14 NOTE — Anesthesia Postprocedure Evaluation (Signed)
Anesthesia Post Note  Patient: Ronald Dunn  Procedure(s) Performed: TOTAL HIP ARTHROPLASTY ANTERIOR APPROACH (Right: Hip)     Patient location during evaluation: PACU Anesthesia Type: Spinal Level of consciousness: awake and alert Pain management: pain level controlled Vital Signs Assessment: post-procedure vital signs reviewed and stable Respiratory status: spontaneous breathing, nonlabored ventilation and respiratory function stable Cardiovascular status: blood pressure returned to baseline and stable Postop Assessment: no apparent nausea or vomiting Anesthetic complications: no   No notable events documented.  Last Vitals:  Vitals:   01/14/21 1133  BP: (!) 150/66  Pulse: 81  Resp: 17  Temp: 36.6 C  SpO2: 99%    Last Pain:  Vitals:   01/14/21 1200  TempSrc:   PainSc: 0-No pain                 Lynda Rainwater

## 2021-01-14 NOTE — Care Plan (Signed)
Ortho Bundle Case Management Note  Patient Details  Name: Ronald Dunn MRN: [id number] Date of Birth: Mar 11, 1932  R THA on 01-14-21 DCP:  Home with wife.  2 story home with 1 ste. DME:  No needs.  Has a RW and 3-in-1. PT:  HEP                   DME Arranged:  N/A DME Agency:  NA  HH Arranged:  NA HH Agency:  NA  Additional Comments: Please contact me with any questions of if this plan should need to change.  Marianne Sofia, RN,CCM [facility]  [phone] 01/14/2021, 5:03 PM

## 2021-01-14 NOTE — Discharge Instructions (Addendum)
Ronald Arabian, MD Total Joint Specialist [facility] [address], [address] [place], [place] [address] [phone]  ANTERIOR APPROACH TOTAL HIP REPLACEMENT POSTOPERATIVE DIRECTIONS     Hip Rehabilitation, Guidelines Following Surgery  The results of a hip operation are greatly improved after range of motion and muscle strengthening exercises. Follow all safety measures which are given to protect your hip. If any of these exercises cause increased pain or swelling in your joint, decrease the amount until you are comfortable again. Then slowly increase the exercises. Call your caregiver if you have problems or questions.   BLOOD CLOT PREVENTION Take a 10 mg Xarelto once a day for three weeks following surgery. Then resume one 81 mg aspirin once a day. You may resume your vitamins/supplements once you have discontinued the Xarelto. Do not take any NSAIDs (Advil, Aleve, Ibuprofen, Meloxicam, etc.) until you have discontinued the Xarelto.   HOME CARE INSTRUCTIONS  Remove items at home which could result in a fall. This includes throw rugs or furniture in walking pathways.  ICE to the affected hip as frequently as 20-30 minutes an hour and then as needed for pain and swelling. Continue to use ice on the hip for pain and swelling from surgery. You may notice swelling that will progress down to the foot and ankle. This is normal after surgery. Elevate the leg when you are not up walking on it.   Continue to use the breathing machine which will help keep your temperature down.  It is common for your temperature to cycle up and down following surgery, especially at night when you are not up moving around and exerting yourself.  The breathing machine keeps your lungs expanded and your temperature down.  DIET You may resume your previous home diet once your are discharged from the hospital.  DRESSING / WOUND CARE / SHOWERING You have an adhesive waterproof bandage over the  incision. Leave this in place until your first follow-up appointment. Once you remove this you will not need to place another bandage.  You may begin showering 3 days following surgery, but do not submerge the incision under water.  ACTIVITY For the first 3-5 days, it is important to rest and keep the operative leg elevated. You should, as a general rule, rest for 50 minutes and walk/stretch for 10 minutes per hour. After 5 days, you may slowly increase activity as tolerated.  Perform the exercises you were provided twice a day for about 15-20 minutes each session. Begin these 2 days following surgery. Walk with your walker as instructed. Use the walker until you are comfortable transitioning to a cane. Walk with the cane in the opposite hand of the operative leg. You may discontinue the cane once you are comfortable and walking steadily. Avoid periods of inactivity such as sitting longer than an hour when not asleep. This helps prevent blood clots.  Do not drive a car for 6 weeks or until released by your surgeon.  Do not drive while taking narcotics.  TED HOSE STOCKINGS Wear the elastic stockings on both legs for three weeks following surgery during the day. You may remove them at night while sleeping.  WEIGHT BEARING Weight bearing as tolerated with assist device (walker, cane, etc) as directed, use it as long as suggested by your surgeon or therapist, typically at least 4-6 weeks.  POSTOPERATIVE CONSTIPATION PROTOCOL Constipation - defined medically as fewer than three stools per week and severe constipation as less than one stool per week.  less than one stool per week.  One of the most common issues patients have following surgery is constipation.  Even if you have a regular bowel pattern at home, your normal regimen is likely to be disrupted due to multiple reasons following surgery.  Combination of anesthesia, postoperative narcotics, change in appetite and fluid intake all can  affect your bowels.  In order to avoid complications following surgery, here are some recommendations in order to help you during your recovery period.  Colace (docusate) - Pick up an over-the-counter form of Colace or another stool softener and take twice a day as long as you are requiring postoperative pain medications.  Take with a full glass of water daily.  If you experience loose stools or diarrhea, hold the colace until you stool forms back up.  If your symptoms do not get better within 1 week or if they get worse, check with your doctor. Dulcolax (bisacodyl) - Pick up over-the-counter and take as directed by the product packaging as needed to assist with the movement of your bowels.  Take with a full glass of water.  Use this product as needed if not relieved by Colace only.  MiraLax (polyethylene glycol) - Pick up over-the-counter to have on hand.  MiraLax is a solution that will increase the amount of water in your bowels to assist with bowel movements.  Take as directed and can mix with a glass of water, juice, soda, coffee, or tea.  Take if you go more than two days without a movement.Do not use MiraLax more than once per day. Call your doctor if you are still constipated or irregular after using this medication for 7 days in a row.  If you continue to have problems with postoperative constipation, please contact the office for further assistance and recommendations.  If you experience "the worst abdominal pain ever" or develop nausea or vomiting, please contact the office immediatly for further recommendations for treatment.  ITCHING  If you experience itching with your medications, try taking only a single pain pill, or even half a pain pill at a time.  You can also use Benadryl over the counter for itching or also to help with sleep.   MEDICATIONS See your medication summary on the "After Visit Summary" that the nursing staff will review with you prior to discharge.  You may have some home  medications which will be placed on hold until you complete the course of blood thinner medication.  It is important for you to complete the blood thinner medication as prescribed by your surgeon.  Continue your approved medications as instructed at time of discharge.  PRECAUTIONS If you experience chest pain or shortness of breath - call 911 immediately for transfer to the hospital emergency department.  If you develop a fever greater that 101 F, purulent drainage from wound, increased redness or drainage from wound, foul odor from the wound/dressing, or calf pain - CONTACT YOUR SURGEON.                                                   FOLLOW-UP APPOINTMENTS Make sure you keep all of your appointments after your operation with your surgeon and caregivers. You should call the office at the above phone number and make an appointment for approximately two weeks after the date of your surgery or on the   date instructed by your surgeon outlined in the "After Visit Summary".  RANGE OF MOTION AND STRENGTHENING EXERCISES  These exercises are designed to help you keep full movement of your hip joint. Follow your caregiver's or physical therapist's instructions. Perform all exercises about fifteen times, three times per day or as directed. Exercise both hips, even if you have had only one joint replacement. These exercises can be done on a training (exercise) mat, on the floor, on a table or on a bed. Use whatever works the best and is most comfortable for you. Use music or television while you are exercising so that the exercises are a pleasant break in your day. This will make your life better with the exercises acting as a break in routine you can look forward to.  Lying on your back, slowly slide your foot toward your buttocks, raising your knee up off the floor. Then slowly slide your foot back down until your leg is straight again.  Lying on your back spread your legs as far apart as you can without causing  discomfort.  Lying on your side, raise your upper leg and foot straight up from the floor as far as is comfortable. Slowly lower the leg and repeat.  Lying on your back, tighten up the muscle in the front of your thigh (quadriceps muscles). You can do this by keeping your leg straight and trying to raise your heel off the floor. This helps strengthen the largest muscle supporting your knee.  Lying on your back, tighten up the muscles of your buttocks both with the legs straight and with the knee bent at a comfortable angle while keeping your heel on the floor.   POST-OPERATIVE OPIOID TAPER INSTRUCTIONS: It is important to wean off of your opioid medication as soon as possible. If you do not need pain medication after your surgery it is ok to stop day one. Opioids include: Codeine, Hydrocodone(Norco, Vicodin), Oxycodone(Percocet, oxycontin) and hydromorphone amongst others.  Long term and even short term use of opiods can cause: Increased pain response Dependence Constipation Depression Respiratory depression And more.  Withdrawal symptoms can include Flu like symptoms Nausea, vomiting And more Techniques to manage these symptoms Hydrate well Eat regular healthy meals Stay active Use relaxation techniques(deep breathing, meditating, yoga) Do Not substitute Alcohol to help with tapering If you have been on opioids for less than two weeks and do not have pain than it is ok to stop all together.  Plan to wean off of opioids This plan should start within one week post op of your joint replacement. Maintain the same interval or time between taking each dose and first decrease the dose.  Cut the total daily intake of opioids by one tablet each day Next start to increase the time between doses. The last dose that should be eliminated is the evening dose.   IF YOU ARE TRANSFERRED TO A SKILLED REHAB FACILITY If the patient is transferred to a skilled rehab facility following release from the  hospital, a list of the current medications will be sent to the facility for the patient to continue.  When discharged from the skilled rehab facility, please have the facility set up the patient's Home Health Physical Therapy prior to being released. Also, the skilled facility will be responsible for providing the patient with their medications at time of release from the facility to include their pain medication, the muscle relaxants, and their blood thinner medication. If the patient is still at the rehab facility   up appointment, the skilled rehab facility will also need to assist the patient in arranging follow up appointment in our office and any transportation needs.  MAKE SURE YOU:  Understand these instructions.  Get help right away if you are not doing well or get worse.    DENTAL ANTIBIOTICS:  In most cases prophylactic antibiotics for Dental procdeures after total joint surgery are not necessary.  Exceptions are as follows:  1. History of prior total joint infection  2. Severely immunocompromised (Organ Transplant, cancer chemotherapy, Rheumatoid biologic meds such as [place])  3. Poorly controlled diabetes (A1C &gt; 8.0, blood glucose over 200)  If you have one of these conditions, contact your surgeon for an antibiotic prescription, prior to your dental procedure.    Pick up stool softner and laxative for home use following surgery while on pain medications. Do not submerge incision under water. Please use good hand washing techniques while changing dressing each day. May shower starting three days after surgery. Please use a clean towel to pat the incision dry following showers. Continue to use ice for pain and swelling after surgery. Do not use any lotions or creams on the incision until instructed by your surgeon.  Information on my medicine - XARELTO (Rivaroxaban)  Why was Xarelto prescribed for you? Xarelto was prescribed for you to reduce the risk of blood clots  forming after orthopedic surgery. The medical term for these abnormal blood clots is venous thromboembolism (VTE).  What do you need to know about xarelto ? Take your Xarelto ONCE DAILY at the same time every day. You may take it either with or without food.  If you have difficulty swallowing the tablet whole, you may crush it and mix in applesauce just prior to taking your dose.  Take Xarelto exactly as prescribed by your doctor and DO NOT stop taking Xarelto without talking to the doctor who prescribed the medication.  Stopping without other VTE prevention medication to take the place of Xarelto may increase your risk of developing a clot.  After discharge, you should have regular check-up appointments with your healthcare provider that is prescribing your Xarelto.    What do you do if you miss a dose? If you miss a dose, take it as soon as you remember on the same day then continue your regularly scheduled once daily regimen the next day. Do not take two doses of Xarelto on the same day.   Important Safety Information A possible side effect of Xarelto is bleeding. You should call your healthcare provider right away if you experience any of the following: Bleeding from an injury or your nose that does not stop. Unusual colored urine (red or dark brown) or unusual colored stools (red or black). Unusual bruising for unknown reasons. A serious fall or if you hit your head (even if there is no bleeding).  Some medicines may interact with Xarelto and might increase your risk of bleeding while on Xarelto. To help avoid this, consult your healthcare provider or pharmacist prior to using any new prescription or non-prescription medications, including herbals, vitamins, non-steroidal anti-inflammatory drugs (NSAIDs) and supplements.  This website has more information on Xarelto: [email or web address]

## 2021-01-14 NOTE — Op Note (Signed)
OPERATIVE REPORT- TOTAL HIP ARTHROPLASTY   PREOPERATIVE DIAGNOSIS: Osteoarthritis of the Right hip.   POSTOPERATIVE DIAGNOSIS: Osteoarthritis of the Right  hip.   PROCEDURE: Right total hip arthroplasty, anterior approach.   SURGEON: Gaynelle Arabian, MD   ASSISTANT: Fenton Foy, PA-C  ANESTHESIA:  Spinal  ESTIMATED BLOOD LOSS:-450 mL    DRAINS: Hemovac x1.   COMPLICATIONS: None   CONDITION: PACU - hemodynamically stable.   BRIEF CLINICAL NOTE: Ronald Dunn is a 87 y.o. male who has advanced end-  stage arthritis of their Right  hip with progressively worsening pain and  dysfunction.The patient has failed nonoperative management and presents for  total hip arthroplasty.   PROCEDURE IN DETAIL: After successful administration of spinal  anesthetic, the traction boots for the [facility] bed were placed on both  feet and the patient was placed onto the [facility] bed, boots placed into the leg  holders. The Right hip was then isolated from the perineum with plastic  drapes and prepped and draped in the usual sterile fashion. ASIS and  greater trochanter were marked and a oblique incision was made, starting  at about 1 cm lateral and 2 cm distal to the ASIS and coursing towards  the anterior cortex of the femur. The skin was cut with a 10 blade  through subcutaneous tissue to the level of the fascia overlying the  tensor fascia lata muscle. The fascia was then incised in line with the  incision at the junction of the anterior third and posterior 2/3rd. The  muscle was teased off the fascia and then the interval between the TFL  and the rectus was developed. The Hohmann retractor was then placed at  the top of the femoral neck over the capsule. The vessels overlying the  capsule were cauterized and the fat on top of the capsule was removed.  A Hohmann retractor was then placed anterior underneath the rectus  femoris to give exposure to the entire anterior capsule. A T-shaped   capsulotomy was performed. The edges were tagged and the femoral head  was identified.       Osteophytes are removed off the superior acetabulum.  The femoral neck was then cut in situ with an oscillating saw. Traction  was then applied to the left lower extremity utilizing the [facility]  traction. The femoral head was then removed. Retractors were placed  around the acetabulum and then circumferential removal of the labrum was  performed. Osteophytes were also removed. Reaming starts at 47 mm to  medialize and  Increased in 2 mm increments to 51 mm. We reamed in  approximately 40 degrees of abduction, 20 degrees anteversion. A 52 mm  pinnacle acetabular shell was then impacted in anatomic position under  fluoroscopic guidance with excellent purchase. We did not need to place  any additional dome screws. A 32 mm neutral + 4 marathon liner was then  placed into the acetabular shell.       The femoral lift was then placed along the lateral aspect of the femur  just distal to the vastus ridge. The leg was  externally rotated and capsule  was stripped off the inferior aspect of the femoral neck down to the  level of the lesser trochanter, this was done with electrocautery. The femur was lifted after this was performed. The  leg was then placed in an extended and adducted position essentially delivering the femur. We also removed the capsule superiorly and the piriformis from the piriformis  fossa to gain excellent exposure of the  proximal femur. Rongeur was used to remove some cancellous bone to get  into the lateral portion of the proximal femur for placement of the  initial starter reamer. The starter broaches was placed  the starter broach  and was shown to go down the center of the canal. Broaching  with the Actis system was then performed starting at size 0  coursing  Up to size 6. A size 6 had excellent torsional and rotational  and axial stability. The trial standard offset neck was then  placed  with a 32 + 1 trial head. The hip was then reduced. We confirmed that  the stem was in the canal both on AP and lateral x-rays. It also has excellent sizing. The hip was reduced with outstanding stability through full extension and full external rotation.. AP pelvis was taken and the leg lengths were measured and found to be equal. Hip was then dislocated again and the femoral head and neck removed. The  femoral broach was removed. Size 6 Actis stem with a standard offset  neck was then impacted into the femur following native anteversion. Has  excellent purchase in the canal. Excellent torsional and rotational and  axial stability. It is confirmed to be in the canal on AP and lateral  fluoroscopic views. The 32 + 1 metal head was placed and the hip  reduced with outstanding stability. Again AP pelvis was taken and it  confirmed that the leg lengths were equal. The wound was then copiously  irrigated with saline solution and the capsule reattached and repaired  with Ethibond suture. 30 ml of .25% Bupivicaine was  injected into the capsule and into the edge of the tensor fascia lata as well as subcutaneous tissue. The fascia overlying the tensor fascia lata was then closed with a running #1 V-Loc. Subcu was closed with interrupted 2-0 Vicryl and subcuticular running 4-0 Monocryl. Incision was cleaned  and dried. Steri-Strips and a bulky sterile dressing applied. The patient was awakened and transported to  recovery in stable condition.        Please note that a surgical assistant was a medical necessity for this procedure to perform it in a safe and expeditious manner. Assistant was necessary to provide appropriate retraction of vital neurovascular structures and to prevent femoral fracture and allow for anatomic placement of the prosthesis.  Gaynelle Arabian, M.D.

## 2021-01-14 NOTE — Transfer of Care (Signed)
Immediate Anesthesia Transfer of Care Note  Patient: Ronald Dunn  Procedure(s) Performed: TOTAL HIP ARTHROPLASTY ANTERIOR APPROACH (Right: Hip)  Patient Location: PACU  Anesthesia Type:Spinal  Level of Consciousness: awake, alert  and oriented  Airway & Oxygen Therapy: Patient Spontanous Breathing and Patient connected to face mask oxygen  Post-op Assessment: Report given to RN and Post -op Vital signs reviewed and stable  Post vital signs: Reviewed and stable  Last Vitals:  Vitals Value Taken Time  BP 105/53 01/14/21 1554  Temp    Pulse 66 01/14/21 1556  Resp 10 01/14/21 1556  SpO2 100 % 01/14/21 1556  Vitals shown include unvalidated device data.  Last Pain:  Vitals:   01/14/21 1200  TempSrc:   PainSc: 0-No pain      Patients Stated Pain Goal: 3 (53/79/43 2761)  Complications: No notable events documented.

## 2021-01-15 ENCOUNTER — Other Ambulatory Visit: Payer: Self-pay

## 2021-01-15 ENCOUNTER — Encounter (HOSPITAL_COMMUNITY): Payer: Self-pay | Admitting: Orthopedic Surgery

## 2021-01-15 DIAGNOSIS — I2510 Atherosclerotic heart disease of native coronary artery without angina pectoris: Secondary | ICD-10-CM | POA: Diagnosis not present

## 2021-01-15 DIAGNOSIS — N189 Chronic kidney disease, unspecified: Secondary | ICD-10-CM | POA: Diagnosis not present

## 2021-01-15 DIAGNOSIS — M1611 Unilateral primary osteoarthritis, right hip: Secondary | ICD-10-CM | POA: Diagnosis not present

## 2021-01-15 DIAGNOSIS — I129 Hypertensive chronic kidney disease with stage 1 through stage 4 chronic kidney disease, or unspecified chronic kidney disease: Secondary | ICD-10-CM | POA: Diagnosis not present

## 2021-01-15 DIAGNOSIS — Z85828 Personal history of other malignant neoplasm of skin: Secondary | ICD-10-CM | POA: Diagnosis not present

## 2021-01-15 DIAGNOSIS — Z951 Presence of aortocoronary bypass graft: Secondary | ICD-10-CM | POA: Diagnosis not present

## 2021-01-15 LAB — BASIC METABOLIC PANEL
Anion gap: 7 (ref 5–15)
BUN: 27 mg/dL — ABNORMAL HIGH (ref 8–23)
CO2: 26 mmol/L (ref 22–32)
Calcium: 9 mg/dL (ref 8.9–10.3)
Chloride: 104 mmol/L (ref 98–111)
Creatinine, Ser: 1.07 mg/dL (ref 0.61–1.24)
GFR, Estimated: >60 mL/min (ref >60)
Glucose, Bld: 159 mg/dL — ABNORMAL HIGH (ref 70–99)
Potassium: 4.3 mmol/L (ref 3.5–5.1)
Sodium: 137 mmol/L (ref 135–145)

## 2021-01-15 LAB — CBC
HCT: 32.2 % — ABNORMAL LOW (ref 39.0–52.0)
Hemoglobin: 10.4 g/dL — ABNORMAL LOW (ref 13.0–17.0)
MCH: 30.8 pg (ref 26.0–34.0)
MCHC: 32.3 g/dL (ref 30.0–36.0)
MCV: 95.3 fL (ref 80.0–100.0)
Platelets: 150 10*3/uL (ref 150–400)
RBC: 3.38 MIL/uL — ABNORMAL LOW (ref 4.22–5.81)
RDW: 14.6 % (ref 11.5–15.5)
WBC: 12.2 10*3/uL — ABNORMAL HIGH (ref 4.0–10.5)
nRBC: 0 % (ref 0.0–0.2)

## 2021-01-15 MED ORDER — RIVAROXABAN 10 MG PO TABS: 10.0000 mg | ORAL_TABLET | Freq: Every day | ORAL | 0 refills | Status: DC

## 2021-01-15 MED ORDER — HYDROCODONE-ACETAMINOPHEN 5-325 MG PO TABS: 1.0000 | ORAL_TABLET | Freq: Four times a day (QID) | ORAL | 0 refills | Status: DC | PRN

## 2021-01-15 MED ORDER — METHOCARBAMOL 500 MG PO TABS: 500.0000 mg | ORAL_TABLET | Freq: Four times a day (QID) | ORAL | 0 refills | Status: DC | PRN

## 2021-01-15 MED ORDER — ASPIRIN EC 81 MG PO TBEC: 81.0000 mg | DELAYED_RELEASE_TABLET | Freq: Every day | ORAL | 11 refills | Status: DC

## 2021-01-15 MED ORDER — TRAMADOL HCL 50 MG PO TABS: 50.0000 mg | ORAL_TABLET | Freq: Four times a day (QID) | ORAL | 0 refills | Status: DC | PRN

## 2021-01-15 NOTE — Progress Notes (Signed)
Physical Therapy Treatment Patient Details Name: Ronald Dunn MRN: [id number] DOB: [birth year] Today's Date: 01/15/2021    History of Present Illness Pt is an 87 year old male s/p Rt direct anterior THA on 01/15/21.  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  Pt reports understanding and feels ready for d/c home today.  Pt also provided with HEP and reviewed verbally.      Follow Up Recommendations  Follow surgeon's recommendation for DC plan and follow-up therapies     Equipment Recommendations  None recommended by PT    Recommendations for Other Services       Precautions / Restrictions Precautions Precautions: Fall Restrictions Weight Bearing Restrictions: No    Mobility  Bed Mobility   Bed Mobility: Supine to Sit           General bed mobility comments: pt in recliner    Transfers Overall transfer level: Needs assistance Equipment used: Rolling walker (2 wheeled) Transfers: Sit to/from Stand Sit to Stand: Min guard         General transfer comment: verbal cues for UE and LE positioning  Ambulation/Gait Ambulation/Gait assistance: Min guard Gait Distance (Feet): 250 Feet Assistive device: Rolling walker (2 wheeled) Gait Pattern/deviations: Step-to pattern;Decreased stance time - right;Antalgic;Step-through pattern     General Gait Details: cues for sequencing, RW positioning, posture and step length; improved to step through pattern   Stairs Stairs: Yes Stairs assistance: Min guard Stair Management: Step to pattern;Forwards;With walker Number of Stairs: 1 General stair comments: pt performed once step (to enter home) and then 3 steps for practicing; pt utilized bil rails for 3 steps and only RW for one step; pt reports understanding   Wheelchair Mobility    Modified Rankin (Stroke Patients Only)       Balance                                             Cognition Arousal/Alertness: Awake/alert Behavior During Therapy: WFL for tasks assessed/performed Overall Cognitive Status: Within Functional Limits for tasks assessed                                        Exercises      General Comments        Pertinent Vitals/Pain Pain Assessment: 0-10 Pain Score: 3  Pain Location: Rt Hip Pain Descriptors / Indicators: Sore;Aching;Discomfort Pain Intervention(s): Monitored during session;Repositioned    Home Living                      Prior Function            PT Goals (current goals can now be found in the care plan section) Progress towards PT goals: Progressing toward goals    Frequency    7X/week      PT Plan Current plan remains appropriate    Co-evaluation              AM-PAC PT "6 Clicks" Mobility   Outcome Measure  Help needed turning from your back to your side while in a flat bed without using bedrails?: A Little Help needed moving from lying on  your back to sitting on the side of a flat bed without using bedrails?: A Little Help needed moving to and from a bed to a chair (including a wheelchair)?: A Little Help needed standing up from a chair using your arms (e.g., wheelchair or bedside chair)?: A Little Help needed to walk in hospital room?: A Little Help needed climbing 3-5 steps with a railing? : A Little 6 Click Score: 18    End of Session Equipment Utilized During Treatment: Gait belt Activity Tolerance: Patient tolerated treatment well Patient left: in chair;with call bell/phone within reach;with chair alarm set;with family/visitor present Nurse Communication: Mobility status PT Visit Diagnosis: Other abnormalities of gait and mobility (R26.89)     Time: 4514-6047 PT Time Calculation (min) (ACUTE ONLY): 21 min  Charges:  $Gait Training: 8-22 mins                     Arlyce Dice, DPT Acute Rehabilitation Services Pager:  [phone] Office: [place] 01/15/2021, 4:49 PM

## 2021-01-15 NOTE — TOC Transition Note (Signed)
Transition of Care [facility]) - CM/SW Discharge Note  Patient Details  Name: Ronald Dunn MRN: [id number] Date of Birth: [birth year]  Transition of Care [facility]) CM/SW Contact:  Sherie Don, LCSW Phone Number: 01/15/2021, 10:46 AM  Clinical Narrative: Patient is expected to discharge home after working with PT. CSW met with patient to confirm discharge plan. Patient will go home with a home exercise program (HEP). Patient has a rolling walker and 3N1 at home, so there are no DME needs at this time. TOC signing off.  Final next level of care: Home/Self Care Barriers to Discharge: No Barriers Identified  Patient Goals and CMS Choice Patient states their goals for this hospitalization and ongoing recovery are:: Discharge home with HEP CMS Medicare.gov Compare Post Acute Care list provided to:: Patient Choice offered to / list presented to : NA  Discharge Plan and Services         DME Arranged: N/A DME Agency: NA HH Arranged: NA [place] Agency: NA  Readmission Risk Interventions No flowsheet data found.

## 2021-01-15 NOTE — Evaluation (Signed)
Physical Therapy Evaluation Patient Details Name: Ronald Dunn MRN: [id number] DOB: [birth year] Today's Date: 01/15/2021   History of Present Illness  Pt is an 87 year old male s/p Rt direct anterior THA on 01/15/21.  PMHx: R TKA 07/2018, CAD s/p CABG, PVCs, PAF, HLD, HTN, DVT, incomplete RBBB  Clinical Impression  Pt is s/p THA resulting in the deficits listed below (see PT Problem List). Pt will benefit from skilled PT to increase their independence and safety with mobility to allow discharge to the venue listed below.  Pt assisted with ambulating in hallway and performing LE exercises.  Pt plans to practice step this afternoon and hopefully d/c home with spouse later today.      Follow Up Recommendations Follow surgeon's recommendation for DC plan and follow-up therapies (plan is for HEP)    Equipment Recommendations  None recommended by PT    Recommendations for Other Services       Precautions / Restrictions Precautions Precautions: Fall Restrictions Weight Bearing Restrictions: No      Mobility  Bed Mobility Overal bed mobility: Needs Assistance Bed Mobility: Supine to Sit     Supine to sit: Min guard     General bed mobility comments: verbal cues for self assist    Transfers Overall transfer level: Needs assistance Equipment used: Rolling walker (2 wheeled) Transfers: Sit to/from Stand Sit to Stand: Min guard         General transfer comment: verbal cues for UE and LE positioning  Ambulation/Gait Ambulation/Gait assistance: Min guard Gait Distance (Feet): 160 Feet Assistive device: Rolling walker (2 wheeled) Gait Pattern/deviations: Step-to pattern;Decreased stance time - right;Antalgic     General Gait Details: max cues for sequencing, RW positioning, posture and step length  Stairs            Wheelchair Mobility    Modified Rankin (Stroke Patients Only)       Balance                                              Pertinent Vitals/Pain Pain Assessment: 0-10 Pain Score: 4  Pain Location: Rt Hip Pain Descriptors / Indicators: Sore;Aching;Discomfort Pain Intervention(s): Repositioned;Monitored during session;Ice applied    Home Living Family/patient expects to be discharged to:: Private residence Living Arrangements: Spouse/significant other   Type of Home: House Home Access: Stairs to enter Entrance Stairs-Rails: None Entrance Stairs-Number of Steps: 1 Home Layout: Able to live on main level with bedroom/bathroom Home Equipment: Environmental consultant - 2 wheels      Prior Function Level of Independence: Independent               Hand Dominance        Extremity/Trunk Assessment        Lower Extremity Assessment Lower Extremity Assessment: RLE deficits/detail RLE Deficits / Details: anticipated post op hip weakness, grossly hip strength 2+/5 throughout       Communication   Communication: No difficulties  Cognition Arousal/Alertness: Awake/alert Behavior During Therapy: WFL for tasks assessed/performed Overall Cognitive Status: Within Functional Limits for tasks assessed                                        General Comments      Exercises Total Joint Exercises Ankle Circles/Pumps: AROM;Both;10  reps Quad Sets: AROM;Right;10 reps Heel Slides: AAROM;Right;10 reps Hip ABduction/ADduction: AROM;Right;Standing;Supine;10 reps Long Arc Quad: AROM;Seated;Right;10 reps Knee Flexion: AROM;Right;Standing;10 reps Marching in Standing: Right;Standing;AROM;10 reps Standing Hip Extension: AROM;Right;Standing;10 reps   Assessment/Plan    PT Assessment Patient needs continued PT services  PT Problem List Decreased range of motion;Decreased strength;Decreased mobility;Decreased knowledge of use of DME;Pain       PT Treatment Interventions Stair training;Gait training;DME instruction;Therapeutic exercise;Balance training;Functional mobility training;Therapeutic  activities;Patient/family education    PT Goals (Current goals can be found in the Care Plan section)  Acute Rehab PT Goals PT Goal Formulation: With patient Time For Goal Achievement: 01/22/21 Potential to Achieve Goals: Good    Frequency 7X/week   Barriers to discharge        Co-evaluation               AM-PAC PT "6 Clicks" Mobility  Outcome Measure Help needed turning from your back to your side while in a flat bed without using bedrails?: A Little Help needed moving from lying on your back to sitting on the side of a flat bed without using bedrails?: A Little Help needed moving to and from a bed to a chair (including a wheelchair)?: A Little Help needed standing up from a chair using your arms (e.g., wheelchair or bedside chair)?: A Little Help needed to walk in hospital room?: A Lot Help needed climbing 3-5 steps with a railing? : A Lot 6 Click Score: 16    End of Session Equipment Utilized During Treatment: Gait belt Activity Tolerance: Patient tolerated treatment well Patient left: in chair;with call bell/phone within reach;with chair alarm set Nurse Communication: Mobility status PT Visit Diagnosis: Other abnormalities of gait and mobility (R26.89)    Time: 9476-5465 PT Time Calculation (min) (ACUTE ONLY): 27 min   Charges:   PT Evaluation $PT Eval Low Complexity: 1 Low PT Treatments $Therapeutic Exercise: 8-22 mins       Jannette Spanner PT, DPT Acute Rehabilitation Services Pager: [phone] Office: [phone]   York Ram E 01/15/2021, 12:24 PM

## 2021-01-15 NOTE — Progress Notes (Signed)
Subjective: 1 Day Post-Op Procedure(s) (LRB): TOTAL HIP ARTHROPLASTY ANTERIOR APPROACH (Right) Patient reports pain as mild.   Patient seen in rounds by Dr. Wynelle Link. Patient is well, and has had no acute complaints or problems. No acute overnight events. Denies SOB, chest pain, or calf pain.  We will begin therapy today.   Objective: Vital signs in last 24 hours: Temp:  [97.7 F (36.5 C)-98 F (36.7 C)] 97.9 F (36.6 C) (06/23 0635) Pulse Rate:  [54-83] 83 (06/23 0635) Resp:  [11-22] 16 (06/23 0635) BP: (99-150)/(55-71) 118/71 (06/23 0635) SpO2:  [92 %-100 %] 97 % (06/23 0635) Weight:  [74.4 kg] 74.4 kg (06/22 2030)  Intake/Output from previous day:  Intake/Output Summary (Last 24 hours) at 01/15/2021 0817 Last data filed at 01/15/2021 7619 Gross per 24 hour  Intake 3155.1 ml  Output 2025 ml  Net 1130.1 ml     Intake/Output this shift: No intake/output data recorded.  Labs: Recent Labs    01/12/21 1221 01/15/21 0334  HGB 12.4* 10.4*   Recent Labs    01/12/21 1221 01/15/21 0334  WBC 7.5 12.2*  RBC 3.96* 3.38*  HCT 37.7* 32.2*  PLT 182 150   Recent Labs    01/12/21 1221 01/15/21 0334  NA 133* 137  K 4.7 4.3  CL 100 104  CO2 26 26  BUN 29* 27*  CREATININE 1.20 1.07  GLUCOSE 90 159*  CALCIUM 9.4 9.0   Recent Labs    01/12/21 1221  INR 1.1    Exam: General - Patient is Alert and Oriented Extremity - Neurologically intact Neurovascular intact Intact pulses distally Dorsiflexion/Plantar flexion intact Dressing - dressing C/D/I Motor Function - intact, moving foot and toes well on exam.   Past Medical History:  Diagnosis Date   Abnormal stress echocardiogram 08/16/2015   Overview:  With post exercise LV dilation   Arthritis 04/14/2017   Chronic ischemic heart disease    CKD (chronic kidney disease) 04/14/2017   Coronary artery disease involving native coronary artery of native heart 04/14/2017   Coronary artery disease with stable angina  pectoris ([place]) 09/01/2015   pt denies chest pain   DVT (deep venous thrombosis) (H[facility])    Right leg   Dyspnea 2017   Essential hypertension 07/22/2015   Gout 04/14/2017   Hyperlipidemia 07/22/2015   Incomplete right bundle branch block (RBBB)    Noted on EKG 01/31/2018   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   Leukocytosis 04/14/2017   Nocturia    Obesity 04/14/2017   On amiodarone therapy 09/01/2015   PAF (paroxysmal atrial fibrillation) ([place]) 09/01/2015   pt unaware   Pleural effusion 09/22/2015   Overview:  Left, post CABG with US guided thoracentesis   Premature ventricular contractions (PVCs) (VPCs) 04/14/2017   S/P CABG (coronary artery bypass graft) 04/14/2017   Skin cancer (melanoma) ([place]) 04/14/2017   Urinary frequency    Urinary urgency    Venous insufficiency    Vitamin B 12 deficiency     Assessment/Plan: 1 Day Post-Op Procedure(s) (LRB): TOTAL HIP ARTHROPLASTY ANTERIOR APPROACH (Right) Principal Problem:   OA (osteoarthritis) of hip Active Problems:   Primary osteoarthritis of right hip  Estimated body mass index is 24.22 kg/m as calculated from the following:   Height as of this encounter: 5\' 9"  (1.753 m).   Weight as of this encounter: 74.4 kg. Advance diet Up with therapy  DVT Prophylaxis - Xarelto Weight bearing as tolerated. Continue therapy.  Plan is to go Home after hospital stay.  Plan for two sessions with PT today, and if meeting goals, will plan for discharge this afternoon.   Patient to follow up in two weeks with Dr. Wynelle Link in clinic.   The PDMP database was reviewed today prior to any opioid medications being prescribed to this patient.   Fenton Foy, [place], PA-C Orthopedic Surgery [phone] 01/15/2021, 8:17 AM

## 2021-01-19 ENCOUNTER — Telehealth: Payer: Self-pay | Admitting: Cardiology

## 2021-01-19 NOTE — Telephone Encounter (Signed)
Patient informed of Dr. Joya Gaskins note.

## 2021-01-19 NOTE — Telephone Encounter (Signed)
    Pt c/o medication issue:  1. Name of Medication:   furosemide (LASIX) 40 MG tablet    2. How are you currently taking this medication (dosage and times per day)? Take 1 tablet (40 mg total) by mouth daily.  3. Are you having a reaction (difficulty breathing--STAT)?   4. What is your medication issue? Pt would like to speak with Dr. Joya Gaskins nurse, he said he have questions about this meds and also to discuss about his recent surgery

## 2021-01-19 NOTE — Telephone Encounter (Signed)
Spoke to patient. He had right hip surgery last week. He is supposed to see vascular soon for left extremity edema. He wanted to know if they would be assessing just the left leg or right leg too. Also since his recent surgery should he postpone the appointment with vascular for any reason. Also he wants to know if he is still to take lasix 40 mg daily and spironolactone 25 mg daily. I informed him we have them both on chart and no notes I see to stop but will check with Dr. Bettina Gavia to be sure.

## 2021-01-22 DIAGNOSIS — I4892 Unspecified atrial flutter: Secondary | ICD-10-CM | POA: Diagnosis not present

## 2021-01-22 DIAGNOSIS — I10 Essential (primary) hypertension: Secondary | ICD-10-CM | POA: Diagnosis not present

## 2021-01-22 DIAGNOSIS — M1990 Unspecified osteoarthritis, unspecified site: Secondary | ICD-10-CM | POA: Diagnosis not present

## 2021-01-28 DIAGNOSIS — D519 Vitamin B12 deficiency anemia, unspecified: Secondary | ICD-10-CM | POA: Diagnosis not present

## 2021-01-28 NOTE — Discharge Summary (Signed)
Physician Discharge Summary   Patient ID: Ronald Dunn MRN: [id number] DOB/AGE: [birth year] 85 y.o.  Admit date: 01/14/2021 Discharge date: [place]  Primary Diagnosis:  s/p Right THA  Admission Diagnoses:  Past Medical History:  Diagnosis Date   Abnormal stress echocardiogram 08/16/2015   Overview:  With post exercise LV dilation   Arthritis 04/14/2017   Chronic ischemic heart disease    CKD (chronic kidney disease) 04/14/2017   Coronary artery disease involving native coronary artery of native heart 04/14/2017   Coronary artery disease with stable angina pectoris ([place]) 09/01/2015   pt denies chest pain   DVT (deep venous thrombosis) (HCC)    Right leg   Dyspnea 2017   Essential hypertension 07/22/2015   Gout 04/14/2017   Hyperlipidemia 07/22/2015   Incomplete right bundle branch block (RBBB)    Noted on EKG 01/31/2018   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   Leukocytosis 04/14/2017   Nocturia    Obesity 04/14/2017   On amiodarone therapy 09/01/2015   PAF (paroxysmal atrial fibrillation) ([place]) 09/01/2015   pt unaware   Pleural effusion 09/22/2015   Overview:  Left, post CABG with US guided thoracentesis   Premature ventricular contractions (PVCs) (VPCs) 04/14/2017   S/P CABG (coronary artery bypass graft) 04/14/2017   Skin cancer (melanoma) ([place]) 04/14/2017   Urinary frequency    Urinary urgency    Venous insufficiency    Vitamin B 12 deficiency    Discharge Diagnoses:   Principal Problem:   OA (osteoarthritis) of hip Active Problems:   Primary osteoarthritis of right hip  Estimated body mass index is 24.22 kg/m as calculated from the following:   Height as of this encounter: 5' 9"  (1.753 m).   Weight as of this encounter: 74.4 kg.  Procedure:  Procedure(s) (LRB): TOTAL HIP ARTHROPLASTY ANTERIOR APPROACH (Right)   Consults: None  HPI: Ronald Dunn is a 87 y.o. male who has advanced end- stage arthritis of their Right  hip with progressively worsening  pain and dysfunction.The patient has failed nonoperative management and presents for total hip arthroplasty.  Laboratory Data: Admission on 01/14/2021, Discharged on 01/15/2021  Component Date Value Ref Range Status   ABO/RH(D) 01/14/2021    Final                   Value:A POS Performed at [facility] [place] [address] [place], [place] [address]    WBC 01/15/2021 12.2 (A) 4.0 - 10.5 K/uL Final   RBC 01/15/2021 3.38 (A) 4.22 - 5.81 MIL/uL Final   Hemoglobin 01/15/2021 10.4 (A) 13.0 - 17.0 g/dL Final   HCT 01/15/2021 32.2 (A) 39.0 - 52.0 % Final   MCV 01/15/2021 95.3  80.0 - 100.0 fL Final   MCH 01/15/2021 30.8  26.0 - 34.0 pg Final   MCHC 01/15/2021 32.3  30.0 - 36.0 g/dL Final   RDW 01/15/2021 14.6  11.5 - 15.5 % Final   Platelets 01/15/2021 150  150 - 400 K/uL Final   nRBC 01/15/2021 0.0  0.0 - 0.2 % Final   Performed at [facility], [place] [address], [place], [place] [address]   Sodium 01/15/2021 137  135 - 145 mmol/L Final   Potassium 01/15/2021 4.3  3.5 - 5.1 mmol/L Final   Chloride 01/15/2021 104  98 - 111 mmol/L Final   CO2 01/15/2021 26  22 - 32 mmol/L Final   Glucose, Bld 01/15/2021 159 (A) 70 - 99 mg/dL Final   Glucose reference range applies  only to samples taken after fasting for at least 8 hours.   BUN 01/15/2021 27 (A) 8 - 23 mg/dL Final   Creatinine, Ser 01/15/2021 1.07  0.61 - 1.24 mg/dL Final   Calcium 01/15/2021 9.0  8.9 - 10.3 mg/dL Final   GFR, Estimated 01/15/2021 >60  >60 mL/min Final   Comment: (NOTE) Calculated using the CKD-EPI Creatinine Equation (2021)    Anion gap 01/15/2021 7  5 - 15 Final   Performed at [facility], [place] [address] [place], [place] [address]  Hospital Outpatient Visit on 01/12/2021  Component Date Value Ref Range Status   SARS Coronavirus 2 01/12/2021 NEGATIVE  NEGATIVE Final   Comment: (NOTE) SARS-CoV-2 target nucleic acids are NOT DETECTED.  The SARS-CoV-2 RNA is  generally detectable in upper and lower respiratory specimens during the acute phase of infection. Negative results do not preclude SARS-CoV-2 infection, do not rule out co-infections with other pathogens, and should not be used as the sole basis for treatment or other patient management decisions. Negative results must be combined with clinical observations, patient history, and epidemiological information. The expected result is Negative.  Fact Sheet for Patients: SugarRoll.be  Fact Sheet for Healthcare Providers: https://www.woods-mathews.com/  This test is not yet approved or cleared by the [place] FDA and  has been authorized for detection and/or diagnosis of SARS-CoV-2 by FDA under an Emergency Use Authorization (EUA). This EUA will remain  in effect (meaning this test can be used) for the duration of the COVID-19 declaration under Se                          ction 564(b)(1) of the Act, 21 U.S.C. section 360bbb-3(b)(1), unless the authorization is terminated or revoked sooner.  Performed at [facility], [place] [address], [place], [place] [address]   Hospital Outpatient Visit on 01/12/2021  Component Date Value Ref Range Status   WBC 01/12/2021 7.5  4.0 - 10.5 K/uL Final   RBC 01/12/2021 3.96 (A) 4.22 - 5.81 MIL/uL Final   Hemoglobin 01/12/2021 12.4 (A) 13.0 - 17.0 g/dL Final   HCT 01/12/2021 37.7 (A) 39.0 - 52.0 % Final   MCV 01/12/2021 95.2  80.0 - 100.0 fL Final   MCH 01/12/2021 31.3  26.0 - 34.0 pg Final   MCHC 01/12/2021 32.9  30.0 - 36.0 g/dL Final   RDW 01/12/2021 14.6  11.5 - 15.5 % Final   Platelets 01/12/2021 182  150 - 400 K/uL Final   nRBC 01/12/2021 0.0  0.0 - 0.2 % Final   Performed at [facility], [place] [address], [place], [place] [address]   Sodium 01/12/2021 133 (A) 135 - 145 mmol/L Final   Potassium 01/12/2021 4.7  3.5 - 5.1 mmol/L Final   Chloride 01/12/2021 100  98 - 111 mmol/L  Final   CO2 01/12/2021 26  22 - 32 mmol/L Final   Glucose, Bld 01/12/2021 90  70 - 99 mg/dL Final   Glucose reference range applies only to samples taken after fasting for at least 8 hours.   BUN 01/12/2021 29 (A) 8 - 23 mg/dL Final   Creatinine, Ser 01/12/2021 1.20  0.61 - 1.24 mg/dL Final   Calcium 01/12/2021 9.4  8.9 - 10.3 mg/dL Final   Total Protein 01/12/2021 7.6  6.5 - 8.1 g/dL Final   Albumin 01/12/2021 3.9  3.5 - 5.0 g/dL Final   AST 01/12/2021 22  15 - 41 U/L  Final   ALT 01/12/2021 18  0 - 44 U/L Final   Alkaline Phosphatase 01/12/2021 82  38 - 126 U/L Final   Total Bilirubin 01/12/2021 0.5  0.3 - 1.2 mg/dL Final   GFR, Estimated 01/12/2021 59 (A) >60 mL/min Final   Comment: (NOTE) Calculated using the CKD-EPI Creatinine Equation (2021)    Anion gap 01/12/2021 7  5 - 15 Final   Performed at [facility], [place] [address] [place], [place] [address]   Prothrombin Time 01/12/2021 13.7  11.4 - 15.2 seconds Final   INR 01/12/2021 1.1  0.8 - 1.2 Final   Comment: (NOTE) INR goal varies based on device and disease states. Performed at [facility], [place] [address] [place], [place] [address]    ABO/RH(D) 01/12/2021 A POS   Final   Antibody Screen 01/12/2021 NEG   Final   Sample Expiration 01/12/2021 01/17/2021,2359   Final   Extend sample reason 01/12/2021    Final                   Value:NO TRANSFUSIONS OR PREGNANCY IN THE PAST 3 MONTHS Performed at [place] [address] [place], [place] [address]    MRSA, PCR 01/12/2021 NEGATIVE  NEGATIVE Final   Staphylococcus aureus 01/12/2021 NEGATIVE  NEGATIVE Final   Comment: (NOTE) The Xpert SA Assay (FDA approved for NASAL specimens in patients 6 years of age and older), is one component of a comprehensive surveillance program. It is not intended to diagnose infection nor to guide or monitor treatment. Performed at [facility], [place] [address], [place], [place] [address]   Office Visit on 12/23/2020  Component Date Value Ref Range Status   Glucose 12/23/2020 92  65 - 99 mg/dL Final   BUN 12/23/2020 24  8 - 27 mg/dL Final   Creatinine, Ser 12/23/2020 1.20  0.76 - 1.27 mg/dL Final   eGFR 12/23/2020 59 (A) >59 mL/min/1.73 Final   BUN/Creatinine Ratio 12/23/2020 20  10 - 24 Final   Sodium 12/23/2020 136  134 - 144 mmol/L Final   Potassium 12/23/2020 5.4 (A) 3.5 - 5.2 mmol/L Final   Chloride 12/23/2020 98  96 - 106 mmol/L Final   CO2 12/23/2020 25  20 - 29 mmol/L Final   Calcium 12/23/2020 9.6  8.6 - 10.2 mg/dL Final   Total Protein 12/23/2020 7.2  6.0 - 8.5 g/dL Final   Albumin 12/23/2020 4.1  3.6 - 4.6 g/dL Final   Globulin, Total 12/23/2020 3.1  1.5 - 4.5 g/dL Final   Albumin/Globulin Ratio 12/23/2020 1.3  1.2 - 2.2 Final   Bilirubin Total 12/23/2020 0.3  0.0 - 1.2 mg/dL Final   Alkaline Phosphatase 12/23/2020 101  44 - 121 IU/L Final   AST 12/23/2020 23  0 - 40 IU/L Final   ALT 12/23/2020 18  0 - 44 IU/L Final   NT-Pro BNP 12/23/2020 265  0 - 486 pg/mL Final   Comment: The following cut-points have been suggested for the use of proBNP for the diagnostic evaluation of heart failure (HF) in patients with acute dyspnea: Modality                     Age           Optimal Cut                            (years)  Point ------------------------------------------------------ Diagnosis (rule in HF)        <50            450 pg/mL                           50 - 75            900 pg/mL                               >75           1800 pg/mL Exclusion (rule out HF)  Age independent     300 pg/mL   Appointment on 12/12/2020  Component Date Value Ref Range Status   S' Lateral 12/12/2020 2.90  cm Final   Area-P 1/2 12/12/2020 3.34  cm2 Final     X-Rays:DG Pelvis Portable  Result Date: 01/14/2021 CLINICAL DATA:  Status post right hip arthroplasty, initial encounter EXAM: PORTABLE PELVIS 1-2 VIEWS COMPARISON:   Intraoperative films from earlier in the same day FINDINGS: Right hip replacement is again seen and stable. No acute bony abnormality is noted. No soft tissue changes are seen. IMPRESSION: Status post right hip replacement. Electronically Signed   By: Inez Catalina M.D.   On: 01/14/2021 16:46   DG C-Arm 1-60 Min-No Report  Result Date: 01/14/2021 Fluoroscopy was utilized by the requesting physician.  No radiographic interpretation.   DG HIP OPERATIVE UNILAT W OR W/O PELVIS RIGHT  Result Date: 01/14/2021 CLINICAL DATA:  Right anterior hip arthroplasty. EXAM: OPERATIVE RIGHT HIP (WITH PELVIS IF PERFORMED) TECHNIQUE: Fluoroscopic spot image(s) were submitted for interpretation post-operatively. COMPARISON:  None. FINDINGS: Two fluoroscopic spot views of the pelvis and right hip obtained in the operating room. Right hip arthroplasty in place. Fluoroscopy time 6 seconds. IMPRESSION: Fluoroscopy for right hip arthroplasty. Electronically Signed   By: Keith Rake M.D.   On: 01/14/2021 15:50    EKG: Orders placed or performed in visit on 08/21/20   EKG 12-Lead     Hospital Course: JAISEN WILTROUT is a 87 y.o. who was admitted to [facility]. They were brought to the operating room on 01/14/2021 and underwent Procedure(s): [place].  Patient tolerated the procedure well and was later transferred to the recovery room and then to the orthopaedic floor for postoperative care. They were given PO and IV analgesics for pain control following their surgery. They were given 24 hours of postoperative antibiotics of  Anti-infectives (From admission, onward)    Start     Dose/Rate Route Frequency Ordered Stop   01/14/21 2000  ceFAZolin (ANCEF) IVPB 2g/100 mL premix        2 g 200 mL/hr over 30 Minutes Intravenous Every 6 hours 01/14/21 1854 01/15/21 0652   01/14/21 1130  ceFAZolin (ANCEF) IVPB 2g/100 mL premix        2 g 200 mL/hr over 30 Minutes Intravenous On call to  O.R. 01/14/21 1124 01/14/21 1410      and started on DVT prophylaxis in the form of Xarelto and TED hose.   PT and OT were ordered for total joint protocol. Discharge planning consulted to help with postop disposition and equipment needs.  Patient had an uneventful night on the evening of surgery. They started to get up OOB with therapy on 01/15/21. Pt was seen during rounds and was ready to go home pending progress  with therapy. He worked with therapy on POD #1 and was meeting goals. Pt was discharged to home later that day in stable condition.  Diet: Regular diet Activity: WBAT Follow-up: in two weeks Disposition: Home Discharged Condition: good   Discharge Instructions     Call MD / Call 911   Complete by: As directed    If you experience chest pain or shortness of breath, CALL 911 and be transported to the hospital emergency room.  If you develope a fever above 101 F, pus (white drainage) or increased drainage or redness at the wound, or calf pain, call your surgeon's office.   Change dressing   Complete by: As directed    You have an adhesive waterproof bandage over the incision. Leave this in place until your first follow-up appointment. Once you remove this you will not need to place another bandage.   Constipation Prevention   Complete by: As directed    Drink plenty of fluids.  Prune juice may be helpful.  You may use a stool softener, such as Colace (over the counter) 100 mg twice a day.  Use MiraLax (over the counter) for constipation as needed.   Diet - low sodium heart healthy   Complete by: As directed    Do not sit on low chairs, stoools or toilet seats, as it may be difficult to get up from low surfaces   Complete by: As directed    Driving restrictions   Complete by: As directed    No driving for two weeks   Post-operative opioid taper instructions:   Complete by: As directed    POST-OPERATIVE OPIOID TAPER INSTRUCTIONS: It is important to wean off of your opioid  medication as soon as possible. If you do not need pain medication after your surgery it is ok to stop day one. Opioids include: Codeine, Hydrocodone(Norco, Vicodin), Oxycodone(Percocet, oxycontin) and hydromorphone amongst others.  Long term and even short term use of opiods can cause: Increased pain response Dependence Constipation Depression Respiratory depression And more.  Withdrawal symptoms can include Flu like symptoms Nausea, vomiting And more Techniques to manage these symptoms Hydrate well Eat regular healthy meals Stay active Use relaxation techniques(deep breathing, meditating, yoga) Do Not substitute Alcohol to help with tapering If you have been on opioids for less than two weeks and do not have pain than it is ok to stop all together.  Plan to wean off of opioids This plan should start within one week post op of your joint replacement. Maintain the same interval or time between taking each dose and first decrease the dose.  Cut the total daily intake of opioids by one tablet each day Next start to increase the time between doses. The last dose that should be eliminated is the evening dose.      TED hose   Complete by: As directed    Use stockings (TED hose) for three weeks on both leg(s).  You may remove them at night for sleeping.   Weight bearing as tolerated   Complete by: As directed       Allergies as of [place]   Not on File      Medication List     STOP taking these medications    multivitamin capsule       TAKE these medications    allopurinol 100 MG tablet Commonly known as: ZYLOPRIM Take 100 mg by mouth 2 (two) times daily.   aspirin EC 81 MG tablet Take 1 tablet (81  mg total) by mouth daily. Do not resume until after completing Xarelto Start taking on: February 05, 2021 What changed:  additional instructions These instructions start on February 05, 2021. If you are unsure what to do until then, ask your doctor or other care  provider.   ezetimibe 10 MG tablet Commonly known as: ZETIA Take 1 tablet by mouth once daily   furosemide 40 MG tablet Commonly known as: LASIX Take 1 tablet (40 mg total) by mouth daily.   HYDROcodone-acetaminophen 5-325 MG tablet Commonly known as: NORCO/VICODIN Take 1-2 tablets by mouth every 6 (six) hours as needed for severe pain.   loteprednol 0.5 % ophthalmic suspension Commonly known as: LOTEMAX Place 1 drop into both eyes every morning.   methocarbamol 500 MG tablet Commonly known as: ROBAXIN Take 1 tablet (500 mg total) by mouth every 6 (six) hours as needed for muscle spasms.   rivaroxaban 10 MG Tabs tablet Commonly known as: XARELTO Take 1 tablet (10 mg total) by mouth daily with breakfast. Then resume 5m Aspirin daily   spironolactone 25 MG tablet Commonly known as: ALDACTONE Take 1 tablet by mouth once daily   traMADol 50 MG tablet Commonly known as: ULTRAM Take 1-2 tablets (50-100 mg total) by mouth every 6 (six) hours as needed for moderate pain.               Discharge Care Instructions  (From admission, onward)           Start     Ordered   01/15/21 0000  Weight bearing as tolerated        01/15/21 0835   01/15/21 0000  Change dressing       Comments: You have an adhesive waterproof bandage over the incision. Leave this in place until your first follow-up appointment. Once you remove this you will not need to place another bandage.   01/15/21 0835            Follow-up Information     AGaynelle Arabian MD. Go on 01/27/2021.   Specialty: Orthopedic Surgery Why: You are scheduled for a post-operative appointment on 01-27-21 at 2:45 pm. Contact information: [phone]                Signed: SFenton Foy MBA, PA-C Orthopedic Surgery 01/28/2021, 4:43 PM

## 2021-01-29 ENCOUNTER — Encounter: Payer: Self-pay | Admitting: Vascular Surgery

## 2021-01-29 ENCOUNTER — Other Ambulatory Visit: Payer: Self-pay

## 2021-01-29 ENCOUNTER — Ambulatory Visit (HOSPITAL_COMMUNITY)
Admission: RE | Admit: 2021-01-29 | Discharge: 2021-01-29 | Disposition: A | Discharge status: Home / Self Care | Payer: Medicare Other | Source: Ambulatory Visit | Attending: Vascular Surgery | Admitting: Vascular Surgery

## 2021-01-29 ENCOUNTER — Ambulatory Visit (INDEPENDENT_AMBULATORY_CARE_PROVIDER_SITE_OTHER): Payer: Medicare Other | Admitting: Vascular Surgery

## 2021-01-29 VITALS — BP 116/60 | HR 80 | Temp 97.7°F | Resp 18 | Ht 69.0 in | Wt 164.0 lb

## 2021-01-29 DIAGNOSIS — R600 Localized edema: Secondary | ICD-10-CM | POA: Insufficient documentation

## 2021-01-29 DIAGNOSIS — M25561 Pain in right knee: Secondary | ICD-10-CM | POA: Insufficient documentation

## 2021-01-29 DIAGNOSIS — I890 Lymphedema, not elsewhere classified: Secondary | ICD-10-CM | POA: Diagnosis not present

## 2021-01-29 DIAGNOSIS — I2510 Atherosclerotic heart disease of native coronary artery without angina pectoris: Secondary | ICD-10-CM

## 2021-01-29 DIAGNOSIS — M25562 Pain in left knee: Secondary | ICD-10-CM | POA: Diagnosis not present

## 2021-01-29 NOTE — Progress Notes (Signed)
ASSESSMENT & PLAN   LYMPHEDEMA/CHRONIC VENOUS INSUFFICIENCY: Although his venous duplex scan showed no significant deep or superficial venous reflux, his physical exam does suggest some underlying chronic venous insufficiency.  He has CEAP C4 venous disease (hyperpigmentation).  In addition I think he has some lymphedema bilaterally especially on the left side.  I explained that when the great saphenous vein is harvested this results and scar tissue formation which interrupts lymphatic flow.  Regardless, the treatment for lymphedema and chronic venous insufficiency are exactly the same.  We have discussed the importance of intermittent leg elevation and the proper positioning for this.  In addition he will continue to wear his compression stockings.  I have recommended knee-high stocking with a gradient of 15 to 20 mmHg.  I have encouraged him to avoid prolonged sitting and standing.  We discussed the importance of exercise specifically walking and water aerobics.  Fortunately he has a healthy weight so this is not an issue.  I encouraged him to make these lifestyle changes part of his daily routine as this will certainly help keep the swelling under control.  I will be happy to see him back at any time if the swelling worsens.  The only other consideration would be a lymphedema pump but I think he is a long ways away from requiring this.  REASON FOR CONSULT:    Bilateral lower extremity swelling.  The consult is requested by Dr. Shirlee More.  HPI:   Ronald Dunn is a 87 y.o. male who was referred by Dr. Bettina Gavia with bilateral lower extremity swelling which is more significant on the left side.  He notes that congestive heart failure has been ruled out.  On my history, the patient has had swelling in both legs but more significantly on the left side for many years.  He underwent coronary revascularization I believe in 2017 and that is when he began having swelling on the left side.  On the right side  has had a hip replacement in June of this year and has had previous knee replacement surgery on the right.  He denies any previous history of DVT or history of phlebitis.  He occasionally gets some aching pain associated with the swelling but for the most part the swelling is asymptomatic.  The swelling has been gradually progressive recently.  Swelling is aggravated by sitting and standing and alleviated somewhat with elevation.  Past Medical History:  Diagnosis Date   Abnormal stress echocardiogram 08/16/2015   Overview:  With post exercise LV dilation   Arthritis 04/14/2017   Chronic ischemic heart disease    CKD (chronic kidney disease) 04/14/2017   Coronary artery disease involving native coronary artery of native heart 04/14/2017   Coronary artery disease with stable angina pectoris ([place]) 09/01/2015   pt denies chest pain   DVT (deep venous thrombosis) ([place])    Right leg   Dyspnea 2017   Essential hypertension 07/22/2015   Gout 04/14/2017   Hyperlipidemia 07/22/2015   Incomplete right bundle branch block (RBBB)    Noted on EKG 01/31/2018   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   Leukocytosis 04/14/2017   Nocturia    Obesity 04/14/2017   On amiodarone therapy 09/01/2015   PAF (paroxysmal atrial fibrillation) ([place]) 09/01/2015   pt unaware   Pleural effusion 09/22/2015   Overview:  Left, post CABG with US guided thoracentesis   Premature ventricular contractions (PVCs) (VPCs) 04/14/2017   S/P CABG (coronary artery bypass graft) 04/14/2017   Skin cancer (  melanoma) ([place]) 04/14/2017   Urinary frequency    Urinary urgency    Venous insufficiency    Vitamin B 12 deficiency     Family History  Problem Relation Age of Onset   Heart failure Father    Heart disease Father     SOCIAL HISTORY: Social History   Tobacco Use   Smoking status: Never   Smokeless tobacco: Never  Substance Use Topics   Alcohol use: Not Currently    Alcohol/week: 1.0 standard drink    Types: 1 Glasses  of wine per week    Not on File  Current Outpatient Medications  Medication Sig Dispense Refill   allopurinol (ZYLOPRIM) 100 MG tablet Take 100 mg by mouth 2 (two) times daily.      [START ON 02/05/2021] aspirin EC 81 MG tablet Take 1 tablet (81 mg total) by mouth daily. Do not resume until after completing Xarelto 30 tablet 11   ezetimibe (ZETIA) 10 MG tablet Take 1 tablet by mouth once daily 90 tablet 2   furosemide (LASIX) 40 MG tablet Take 1 tablet (40 mg total) by mouth daily. 90 tablet 3   loteprednol (LOTEMAX) 0.5 % ophthalmic suspension Place 1 drop into both eyes every morning.      methocarbamol (ROBAXIN) 500 MG tablet Take 1 tablet (500 mg total) by mouth every 6 (six) hours as needed for muscle spasms. 40 tablet 0   rivaroxaban (XARELTO) 10 MG TABS tablet Take 1 tablet (10 mg total) by mouth daily with breakfast. Then resume 81mg  Aspirin daily 20 tablet 0   HYDROcodone-acetaminophen (NORCO/VICODIN) 5-325 MG tablet Take 1-2 tablets by mouth every 6 (six) hours as needed for severe pain. 42 tablet 0   spironolactone (ALDACTONE) 25 MG tablet Take 1 tablet by mouth once daily (Patient not taking: Reported on 01/29/2021) 90 tablet 0   traMADol (ULTRAM) 50 MG tablet Take 1-2 tablets (50-100 mg total) by mouth every 6 (six) hours as needed for moderate pain. 40 tablet 0   No current facility-administered medications for this visit.    REVIEW OF SYSTEMS:  [X]  denotes positive finding, [ ]  denotes negative finding Cardiac  Comments:  Chest pain or chest pressure:    Shortness of breath upon exertion:    Short of breath when lying flat:    Irregular heart rhythm:        Vascular    Pain in calf, thigh, or hip brought on by ambulation:    Pain in feet at night that wakes you up from your sleep:     Blood clot in your veins:    Leg swelling:  x       Pulmonary    Oxygen at home:    Productive cough:     Wheezing:         Neurologic    Sudden weakness in arms or legs:     Sudden  numbness in arms or legs:     Sudden onset of difficulty speaking or slurred speech:    Temporary loss of vision in one eye:     Problems with dizziness:         Gastrointestinal    Blood in stool:     Vomited blood:         Genitourinary    Burning when urinating:     Blood in urine:        Psychiatric    Major depression:         Hematologic  Bleeding problems:    Problems with blood clotting too easily:        Skin    Rashes or ulcers:        Constitutional    Fever or chills:    -  PHYSICAL EXAM:   Vitals:   01/29/21 1322  BP: 116/60  Pulse: 80  Resp: 18  Temp: 97.7 F (36.5 C)  TempSrc: Temporal  SpO2: 99%  Weight: 164 lb (74.4 kg)  Height: 5\' 9"  (1.753 m)   Body mass index is 24.22 kg/m. GENERAL: The patient is a well-nourished male, in no acute distress. The vital signs are documented above. CARDIAC: There is a regular rate and rhythm.  VASCULAR: I do not detect carotid bruits. He has palpable pedal pulses. He has mild bilateral lower extremity swelling which is slightly more significant on the left side. He has some telangiectasias bilaterally and some mild hyperpigmentation.       PULMONARY: There is good air exchange bilaterally without wheezing or rales. ABDOMEN: Soft and non-tender with normal pitched bowel sounds.  MUSCULOSKELETAL: There are no major deformities. NEUROLOGIC: No focal weakness or paresthesias are detected. SKIN: There are no ulcers or rashes noted. PSYCHIATRIC: The patient has a normal affect.  DATA:    VENOUS DUPLEX: I have independently interpreted his venous duplex scan today.  This was of the left lower extremity only.  There was no evidence of DVT or superficial venous thrombosis.  There was no deep venous reflux.  The left great saphenous vein had been previously harvested.  Otherwise there was no significant superficial venous reflux in the small saphenous vein.     Deitra [facility]

## 2021-02-17 DIAGNOSIS — M1712 Unilateral primary osteoarthritis, left knee: Secondary | ICD-10-CM | POA: Diagnosis not present

## 2021-02-17 DIAGNOSIS — Z471 Aftercare following joint replacement surgery: Secondary | ICD-10-CM | POA: Diagnosis not present

## 2021-02-17 DIAGNOSIS — Z4789 Encounter for other orthopedic aftercare: Secondary | ICD-10-CM | POA: Diagnosis not present

## 2021-03-05 DIAGNOSIS — D519 Vitamin B12 deficiency anemia, unspecified: Secondary | ICD-10-CM | POA: Diagnosis not present

## 2021-04-02 DIAGNOSIS — D510 Vitamin B12 deficiency anemia due to intrinsic factor deficiency: Secondary | ICD-10-CM | POA: Diagnosis not present

## 2021-04-24 DIAGNOSIS — I259 Chronic ischemic heart disease, unspecified: Secondary | ICD-10-CM | POA: Diagnosis not present

## 2021-04-24 DIAGNOSIS — I4892 Unspecified atrial flutter: Secondary | ICD-10-CM | POA: Diagnosis not present

## 2021-04-24 DIAGNOSIS — I10 Essential (primary) hypertension: Secondary | ICD-10-CM | POA: Diagnosis not present

## 2021-05-08 DIAGNOSIS — Z23 Encounter for immunization: Secondary | ICD-10-CM | POA: Diagnosis not present

## 2021-05-08 DIAGNOSIS — D519 Vitamin B12 deficiency anemia, unspecified: Secondary | ICD-10-CM | POA: Diagnosis not present

## 2021-05-25 DIAGNOSIS — H43813 Vitreous degeneration, bilateral: Secondary | ICD-10-CM | POA: Diagnosis not present

## 2021-05-26 DIAGNOSIS — M25562 Pain in left knee: Secondary | ICD-10-CM | POA: Diagnosis not present

## 2021-05-26 DIAGNOSIS — M545 Low back pain, unspecified: Secondary | ICD-10-CM | POA: Diagnosis not present

## 2021-05-26 DIAGNOSIS — M48061 Spinal stenosis, lumbar region without neurogenic claudication: Secondary | ICD-10-CM | POA: Diagnosis not present

## 2021-05-27 DIAGNOSIS — M5451 Vertebrogenic low back pain: Secondary | ICD-10-CM | POA: Diagnosis not present

## 2021-05-27 DIAGNOSIS — M79672 Pain in left foot: Secondary | ICD-10-CM | POA: Diagnosis not present

## 2021-06-01 DIAGNOSIS — M5451 Vertebrogenic low back pain: Secondary | ICD-10-CM | POA: Diagnosis not present

## 2021-06-11 DIAGNOSIS — D519 Vitamin B12 deficiency anemia, unspecified: Secondary | ICD-10-CM | POA: Diagnosis not present

## 2021-06-21 NOTE — Progress Notes (Signed)
Cardiology Office Note:    Date:  06/23/2021   ID:  Trilby Drummer, DOB 08-23-1931, MRN [id number]  PCP:  Angelina Sheriff, MD  Cardiologist:  Shirlee More, MD    Referring MD: Angelina Sheriff, MD    ASSESSMENT:    1. Coronary artery disease, unspecified vessel or lesion type, unspecified whether angina present, unspecified whether native or transplanted heart   2. Essential hypertension   3. Hyperlipidemia, unspecified hyperlipidemia type   4. Lower extremity edema   5. RBBB with left anterior fascicular block    PLAN:    In order of problems listed above:  Stable CAD following CABG on current treatment having no angina continue aspirin and nonstatin lipid-lowering. BP at target continue his MRA check renal function potassium Statin intolerant continue Zetia recheck lipid profile second agent as needed consider PCSK9 inhibitor Marked improvement with resolution with conservative measures   Next appointment: 1 year   Medication Adjustments/Labs and Tests Ordered: Current medicines are reviewed at length with the patient today.  Concerns regarding medicines are outlined above.  Orders Placed This Encounter  Procedures   Comprehensive metabolic panel   Lipid panel   EKG 12-Lead    No orders of the defined types were placed in this encounter.   Chief Complaint  Patient presents with   Follow-up   Coronary Artery Disease    History of Present Illness:    Ronald Dunn is a 87 y.o. male with a hx of CAD and CABG 2017 hypertension with CKD hyperlipidemia SVT and chronic lower extremity edema last seen 12/23/2020.  He has been seen by vascular surgery for his lymphedema and chronic venous insufficiency.An echocardiogram performed 12/12/2020 because of persistent lower extremity edema and concerns of heart failure EF was normal 60 to 65% mild concentric LVH normal right ventricular function no evidence of pulmonary hypertension venous pressures were normal and  diastolic function was normal.   Compliance with diet, lifestyle and medications: Yes  Since seeing vascular surgery during echocardiogram ablations peripheral edema has cleared normal recheck sodium diuretic From a cardiology perspective doing well no edema shortness of breath chest pain palpitations or syncope. Unfortunately is bothered by low back pain radiating down the legs. Past Medical History:  Diagnosis Date   Abnormal stress echocardiogram 08/16/2015   Overview:  With post exercise LV dilation   Arthritis 04/14/2017   Chronic ischemic heart disease    CKD (chronic kidney disease) 04/14/2017   Coronary artery disease involving native coronary artery of native heart 04/14/2017   Coronary artery disease with stable angina pectoris ([place]) 09/01/2015   pt denies chest pain   DVT (deep venous thrombosis) ([facility])    Right leg   Dyspnea 2017   Essential hypertension 07/22/2015   Gout 04/14/2017   Hyperlipidemia 07/22/2015   Incomplete right bundle branch block (RBBB)    Noted on EKG 01/31/2018   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   Leukocytosis 04/14/2017   Nocturia    Obesity 04/14/2017   On amiodarone therapy 09/01/2015   PAF (paroxysmal atrial fibrillation) ([place]) 09/01/2015   pt unaware   Pleural effusion 09/22/2015   Overview:  Left, post CABG with US guided thoracentesis   Premature ventricular contractions (PVCs) (VPCs) 04/14/2017   S/P CABG (coronary artery bypass graft) 04/14/2017   Skin cancer (melanoma) ([place]) 04/14/2017   Urinary frequency    Urinary urgency    Venous insufficiency    Vitamin B 12 deficiency     Past  Surgical History:  Procedure Laterality Date   CARDIAC CATHETERIZATION  09/2015   COLONOSCOPY     CORNEAL TRANSPLANT Bilateral    CORONARY ARTERY BYPASS GRAFT  08/26/2015   FOOT SURGERY Right    TOTAL HIP ARTHROPLASTY Right 01/14/2021   Procedure: TOTAL HIP ARTHROPLASTY ANTERIOR APPROACH;  Surgeon: Gaynelle Arabian, MD;  Location: [facility];  Service:  Orthopedics;  Laterality: Right;   TOTAL KNEE ARTHROPLASTY Right 08/14/2018   Procedure: TOTAL KNEE ARTHROPLASTY;  Surgeon: Vickey Huger, MD;  Location: [facility];  Service: Orthopedics;  Laterality: Right;    Current Medications: Current Meds  Medication Sig   allopurinol (ZYLOPRIM) 100 MG tablet Take 100 mg by mouth 2 (two) times daily.    aspirin EC 81 MG tablet Take 1 tablet (81 mg total) by mouth daily. Do not resume until after completing Xarelto   ezetimibe (ZETIA) 10 MG tablet Take 1 tablet by mouth once daily   Ibuprofen 200 MG CAPS Take 200 mg by mouth 3 (three) times daily.   loteprednol (LOTEMAX) 0.5 % ophthalmic suspension Place 1 drop into both eyes every morning.    spironolactone (ALDACTONE) 25 MG tablet Take 1 tablet by mouth once daily     Allergies:   Patient has no known allergies.   Social History   Socioeconomic History   Marital status: Married    Spouse name: Not on file   Number of children: Not on file   Years of education: Not on file   Highest education level: Not on file  Occupational History   Not on file  Tobacco Use   Smoking status: Never   Smokeless tobacco: Never  Vaping Use   Vaping Use: Never used  Substance and Sexual Activity   Alcohol use: Not Currently    Alcohol/week: 1.0 standard drink    Types: 1 Glasses of wine per week   Drug use: No   Sexual activity: Not on file  Other Topics Concern   Not on file  Social History Narrative   Not on file   Social Determinants of Health   Financial Resource Strain: Not on file  Food Insecurity: Not on file  Transportation Needs: Not on file  Physical Activity: Not on file  Stress: Not on file  Social Connections: Not on file     Family History: The patient's family history includes Heart disease in his father; Heart failure in his father. ROS:   Please see the history of present illness.    All other systems reviewed and are negative.  EKGs/Labs/Other Studies Reviewed:    The  following studies were reviewed today:  EKG:  EKG ordered today and personally reviewed.  The ekg ordered today demonstrates sinus rhythm right bundle branch block left anterior hemiblock normal PR interval unchanged January 2022  Recent Labs: 12/23/2020: NT-Pro BNP 265 01/12/2021: ALT 18 01/15/2021: BUN 27; Creatinine, Ser 1.07; Hemoglobin 10.4; Platelets 150; Potassium 4.3; Sodium 137  Recent Lipid Panel    Component Value Date/Time   CHOL 176 06/23/2020 1008   TRIG 112 06/23/2020 1008   HDL 70 06/23/2020 1008   CHOLHDL 2.5 06/23/2020 1008   LDLCALC 86 06/23/2020 1008    Physical Exam:    VS:  BP 132/68   Pulse 92   Ht 5\' 9"  (1.753 m)   Wt 166 lb (75.3 kg)   SpO2 97%   BMI 24.51 kg/m     Wt Readings from Last 3 Encounters:  06/23/21 166 lb (75.3 kg)  01/29/21  164 lb (74.4 kg)  01/14/21 164 lb 0.4 oz (74.4 kg)     GEN:  Well nourished, well developed in no acute distress HEENT: Normal NECK: No JVD; No carotid bruits LYMPHATICS: No lymphadenopathy CARDIAC: RRR, no murmurs, rubs, gallops RESPIRATORY:  Clear to auscultation without rales, wheezing or rhonchi  ABDOMEN: Soft, non-tender, non-distended MUSCULOSKELETAL:  No edema; No deformity  SKIN: Warm and dry NEUROLOGIC:  Alert and oriented x 3 PSYCHIATRIC:  Normal affect    Signed, Shirlee More, MD  06/23/2021 3:44 PM    [facility]

## 2021-06-23 ENCOUNTER — Ambulatory Visit (INDEPENDENT_AMBULATORY_CARE_PROVIDER_SITE_OTHER): Payer: Medicare Other | Admitting: Cardiology

## 2021-06-23 ENCOUNTER — Encounter: Payer: Self-pay | Admitting: Cardiology

## 2021-06-23 ENCOUNTER — Other Ambulatory Visit: Payer: Self-pay

## 2021-06-23 VITALS — BP 132/68 | HR 92 | Ht 69.0 in | Wt 166.0 lb

## 2021-06-23 DIAGNOSIS — R600 Localized edema: Secondary | ICD-10-CM | POA: Diagnosis not present

## 2021-06-23 DIAGNOSIS — I10 Essential (primary) hypertension: Secondary | ICD-10-CM | POA: Diagnosis not present

## 2021-06-23 DIAGNOSIS — E785 Hyperlipidemia, unspecified: Secondary | ICD-10-CM | POA: Diagnosis not present

## 2021-06-23 DIAGNOSIS — I2510 Atherosclerotic heart disease of native coronary artery without angina pectoris: Secondary | ICD-10-CM

## 2021-06-23 DIAGNOSIS — I452 Bifascicular block: Secondary | ICD-10-CM

## 2021-06-23 NOTE — Patient Instructions (Signed)

## 2021-06-24 ENCOUNTER — Telehealth: Payer: Self-pay

## 2021-06-24 LAB — LIPID PANEL
Chol/HDL Ratio: 2.5 ratio (ref 0.0–5.0)
Cholesterol, Total: 181 mg/dL (ref 100–199)
HDL: 71 mg/dL (ref >39)
LDL Chol Calc (NIH): 94 mg/dL (ref 0–99)
Triglycerides: 86 mg/dL (ref 0–149)
VLDL Cholesterol Cal: 16 mg/dL (ref 5–40)

## 2021-06-24 LAB — COMPREHENSIVE METABOLIC PANEL
ALT: 18 IU/L (ref 0–44)
AST: 22 IU/L (ref 0–40)
Albumin/Globulin Ratio: 1.8 (ref 1.2–2.2)
Albumin: 4.2 g/dL (ref 3.6–4.6)
Alkaline Phosphatase: 110 IU/L (ref 44–121)
BUN/Creatinine Ratio: 19 (ref 10–24)
BUN: 22 mg/dL (ref 8–27)
Bilirubin Total: 0.5 mg/dL (ref 0.0–1.2)
CO2: 28 mmol/L (ref 20–29)
Calcium: 9.7 mg/dL (ref 8.6–10.2)
Chloride: 104 mmol/L (ref 96–106)
Creatinine, Ser: 1.16 mg/dL (ref 0.76–1.27)
Globulin, Total: 2.4 g/dL (ref 1.5–4.5)
Glucose: 97 mg/dL (ref 70–99)
Potassium: 4.4 mmol/L (ref 3.5–5.2)
Sodium: 142 mmol/L (ref 134–144)
Total Protein: 6.6 g/dL (ref 6.0–8.5)
eGFR: 61 mL/min/{1.73_m2} (ref >59)

## 2021-06-24 NOTE — Telephone Encounter (Signed)
Spoke with patient regarding results and recommendation.  Patient verbalizes understanding and is agreeable to plan of care. Advised patient to call back with any issues or concerns.  

## 2021-06-24 NOTE — Telephone Encounter (Signed)
-----   Message from Richardo Priest, MD sent at 06/24/2021 10:34 AM EST ----- Normal or stable result  No changes

## 2021-07-09 DIAGNOSIS — D519 Vitamin B12 deficiency anemia, unspecified: Secondary | ICD-10-CM | POA: Diagnosis not present

## 2021-07-13 DIAGNOSIS — M5451 Vertebrogenic low back pain: Secondary | ICD-10-CM | POA: Diagnosis not present

## 2021-07-15 DIAGNOSIS — M5416 Radiculopathy, lumbar region: Secondary | ICD-10-CM | POA: Insufficient documentation

## 2021-07-16 DIAGNOSIS — M5451 Vertebrogenic low back pain: Secondary | ICD-10-CM | POA: Diagnosis not present

## 2021-07-16 DIAGNOSIS — M5416 Radiculopathy, lumbar region: Secondary | ICD-10-CM | POA: Diagnosis not present

## 2021-07-21 DIAGNOSIS — M5416 Radiculopathy, lumbar region: Secondary | ICD-10-CM | POA: Diagnosis not present

## 2021-07-21 DIAGNOSIS — M5451 Vertebrogenic low back pain: Secondary | ICD-10-CM | POA: Diagnosis not present

## 2021-07-23 DIAGNOSIS — M5416 Radiculopathy, lumbar region: Secondary | ICD-10-CM | POA: Diagnosis not present

## 2021-07-29 DIAGNOSIS — M5416 Radiculopathy, lumbar region: Secondary | ICD-10-CM | POA: Diagnosis not present

## 2021-07-29 DIAGNOSIS — M5451 Vertebrogenic low back pain: Secondary | ICD-10-CM | POA: Diagnosis not present

## 2021-07-31 DIAGNOSIS — M5451 Vertebrogenic low back pain: Secondary | ICD-10-CM | POA: Diagnosis not present

## 2021-07-31 DIAGNOSIS — M5416 Radiculopathy, lumbar region: Secondary | ICD-10-CM | POA: Diagnosis not present

## 2021-08-03 DIAGNOSIS — M5451 Vertebrogenic low back pain: Secondary | ICD-10-CM | POA: Diagnosis not present

## 2021-08-03 DIAGNOSIS — M5416 Radiculopathy, lumbar region: Secondary | ICD-10-CM | POA: Diagnosis not present

## 2021-08-06 DIAGNOSIS — M5416 Radiculopathy, lumbar region: Secondary | ICD-10-CM | POA: Diagnosis not present

## 2021-08-06 DIAGNOSIS — M5451 Vertebrogenic low back pain: Secondary | ICD-10-CM | POA: Diagnosis not present

## 2021-08-10 DIAGNOSIS — M5451 Vertebrogenic low back pain: Secondary | ICD-10-CM | POA: Diagnosis not present

## 2021-08-10 DIAGNOSIS — M5416 Radiculopathy, lumbar region: Secondary | ICD-10-CM | POA: Diagnosis not present

## 2021-08-11 DIAGNOSIS — M5451 Vertebrogenic low back pain: Secondary | ICD-10-CM | POA: Diagnosis not present

## 2021-08-11 DIAGNOSIS — M5136 Other intervertebral disc degeneration, lumbar region: Secondary | ICD-10-CM | POA: Diagnosis not present

## 2021-08-13 DIAGNOSIS — M545 Low back pain, unspecified: Secondary | ICD-10-CM | POA: Insufficient documentation

## 2021-08-13 DIAGNOSIS — M5451 Vertebrogenic low back pain: Secondary | ICD-10-CM | POA: Diagnosis not present

## 2021-08-13 DIAGNOSIS — M5416 Radiculopathy, lumbar region: Secondary | ICD-10-CM | POA: Diagnosis not present

## 2021-08-24 DIAGNOSIS — D519 Vitamin B12 deficiency anemia, unspecified: Secondary | ICD-10-CM | POA: Diagnosis not present

## 2021-09-22 ENCOUNTER — Other Ambulatory Visit: Payer: Self-pay | Admitting: Cardiology

## 2021-10-12 DIAGNOSIS — D519 Vitamin B12 deficiency anemia, unspecified: Secondary | ICD-10-CM | POA: Diagnosis not present

## 2021-11-17 DIAGNOSIS — D519 Vitamin B12 deficiency anemia, unspecified: Secondary | ICD-10-CM | POA: Diagnosis not present

## 2021-11-17 DIAGNOSIS — L570 Actinic keratosis: Secondary | ICD-10-CM | POA: Diagnosis not present

## 2021-11-17 DIAGNOSIS — D2239 Melanocytic nevi of other parts of face: Secondary | ICD-10-CM | POA: Diagnosis not present

## 2021-11-17 DIAGNOSIS — L821 Other seborrheic keratosis: Secondary | ICD-10-CM | POA: Diagnosis not present

## 2021-11-17 DIAGNOSIS — D225 Melanocytic nevi of trunk: Secondary | ICD-10-CM | POA: Diagnosis not present

## 2021-12-14 DIAGNOSIS — Z6824 Body mass index (BMI) 24.0-24.9, adult: Secondary | ICD-10-CM | POA: Diagnosis not present

## 2021-12-14 DIAGNOSIS — R202 Paresthesia of skin: Secondary | ICD-10-CM | POA: Diagnosis not present

## 2021-12-14 DIAGNOSIS — M5416 Radiculopathy, lumbar region: Secondary | ICD-10-CM | POA: Diagnosis not present

## 2021-12-24 DIAGNOSIS — D519 Vitamin B12 deficiency anemia, unspecified: Secondary | ICD-10-CM | POA: Diagnosis not present

## 2022-01-12 DIAGNOSIS — M47816 Spondylosis without myelopathy or radiculopathy, lumbar region: Secondary | ICD-10-CM | POA: Diagnosis not present

## 2022-01-12 DIAGNOSIS — M4316 Spondylolisthesis, lumbar region: Secondary | ICD-10-CM | POA: Diagnosis not present

## 2022-01-12 DIAGNOSIS — Z6825 Body mass index (BMI) 25.0-25.9, adult: Secondary | ICD-10-CM | POA: Diagnosis not present

## 2022-01-12 DIAGNOSIS — S32030S Wedge compression fracture of third lumbar vertebra, sequela: Secondary | ICD-10-CM | POA: Diagnosis not present

## 2022-01-12 DIAGNOSIS — M48062 Spinal stenosis, lumbar region with neurogenic claudication: Secondary | ICD-10-CM | POA: Diagnosis not present

## 2022-01-16 DIAGNOSIS — S81812A Laceration without foreign body, left lower leg, initial encounter: Secondary | ICD-10-CM | POA: Diagnosis not present

## 2022-01-21 DIAGNOSIS — D510 Vitamin B12 deficiency anemia due to intrinsic factor deficiency: Secondary | ICD-10-CM | POA: Diagnosis not present

## 2022-01-30 DIAGNOSIS — S81812D Laceration without foreign body, left lower leg, subsequent encounter: Secondary | ICD-10-CM | POA: Diagnosis not present

## 2022-01-30 DIAGNOSIS — L03116 Cellulitis of left lower limb: Secondary | ICD-10-CM | POA: Diagnosis not present

## 2022-02-19 DIAGNOSIS — S81802A Unspecified open wound, left lower leg, initial encounter: Secondary | ICD-10-CM | POA: Diagnosis not present

## 2022-02-24 DIAGNOSIS — S81802A Unspecified open wound, left lower leg, initial encounter: Secondary | ICD-10-CM | POA: Diagnosis not present

## 2022-03-10 DIAGNOSIS — S81802A Unspecified open wound, left lower leg, initial encounter: Secondary | ICD-10-CM | POA: Diagnosis not present

## 2022-03-10 DIAGNOSIS — H1131 Conjunctival hemorrhage, right eye: Secondary | ICD-10-CM | POA: Diagnosis not present

## 2022-03-24 DIAGNOSIS — S81802A Unspecified open wound, left lower leg, initial encounter: Secondary | ICD-10-CM | POA: Diagnosis not present

## 2022-04-13 DIAGNOSIS — S81802A Unspecified open wound, left lower leg, initial encounter: Secondary | ICD-10-CM | POA: Diagnosis not present

## 2022-04-22 DIAGNOSIS — Z23 Encounter for immunization: Secondary | ICD-10-CM | POA: Diagnosis not present

## 2022-04-22 DIAGNOSIS — D519 Vitamin B12 deficiency anemia, unspecified: Secondary | ICD-10-CM | POA: Diagnosis not present

## 2022-04-30 DIAGNOSIS — S81802A Unspecified open wound, left lower leg, initial encounter: Secondary | ICD-10-CM | POA: Diagnosis not present

## 2022-05-21 DIAGNOSIS — S81802A Unspecified open wound, left lower leg, initial encounter: Secondary | ICD-10-CM | POA: Diagnosis not present

## 2022-05-25 DIAGNOSIS — D519 Vitamin B12 deficiency anemia, unspecified: Secondary | ICD-10-CM | POA: Diagnosis not present

## 2022-05-31 DIAGNOSIS — H43813 Vitreous degeneration, bilateral: Secondary | ICD-10-CM | POA: Diagnosis not present

## 2022-06-28 ENCOUNTER — Other Ambulatory Visit: Payer: Self-pay | Admitting: Cardiology

## 2022-06-30 DIAGNOSIS — D519 Vitamin B12 deficiency anemia, unspecified: Secondary | ICD-10-CM | POA: Diagnosis not present

## 2022-07-02 DIAGNOSIS — M1712 Unilateral primary osteoarthritis, left knee: Secondary | ICD-10-CM | POA: Diagnosis not present

## 2022-07-23 DIAGNOSIS — D510 Vitamin B12 deficiency anemia due to intrinsic factor deficiency: Secondary | ICD-10-CM | POA: Diagnosis not present

## 2022-08-10 ENCOUNTER — Telehealth: Payer: Self-pay

## 2022-08-10 NOTE — Telephone Encounter (Signed)
   Pre-operative Risk Assessment    Patient Name: Ronald Dunn  DOB: Dec 01, 1931 MRN: [id number]      Request for Surgical Clearance    Procedure:   Lt Total Knee Arthroplasty  Date of Surgery:  Clearance 11/08/22                                 Surgeon:  Dr Gaynelle Arabian Surgeon's Group or Practice Name:  [facility] Phone number:  [phone] Fax number:  [phone]   Type of Clearance Requested:   - Medical    Type of Anesthesia:   Choice   Additional requests/questions:  Please fax a copy of signed request form  to the surgeon's office.  Signed, Toni Arthurs   08/10/2022, 7:08 AM

## 2022-08-18 ENCOUNTER — Other Ambulatory Visit: Payer: Self-pay | Admitting: Cardiology

## 2022-09-02 NOTE — Progress Notes (Signed)
Cardiology Office Note:    Date:  09/03/2022   ID:  Ronald Dunn, DOB February 08, 1932, MRN [id number]  PCP:  Angelina Sheriff, MD  Cardiologist:  Shirlee More, MD    Referring MD: Angelina Sheriff, MD    ASSESSMENT:    1. Preoperative cardiovascular examination   2. Coronary artery disease, unspecified vessel or lesion type, unspecified whether angina present, unspecified whether native or transplanted heart   3. Essential hypertension   4. Hyperlipidemia, unspecified hyperlipidemia type   5. Chronic kidney disease, unspecified CKD stage    PLAN:    In order of problems listed above:  His planned surgery is elective intermediate risk.  His cardiovascular problems include stable CAD following CABG well-preserved exercise tolerance well-controlled hypertension hyperlipidemia but he also has bifascicular heart block cannot taste EKG is first-degree AV block and will utilize a 1 week event monitor to screen for significant bradycardia prior to his elective surgery I will put an addendum to this note otherwise he is optimized and ready for surgical procedure. Continue his current statin lipids at target Hypertension controlled with his diuretic spironolactone He can withdraw aspirin 1 week prior to his procedure generally restart 1 to 2 days post at the direction of surgery   Next appointment: 6 months   Medication Adjustments/Labs and Tests Ordered: Current medicines are reviewed at length with the patient today.  Concerns regarding medicines are outlined above.  No orders of the defined types were placed in this encounter.  No orders of the defined types were placed in this encounter.   Chief complaint I need to be seen prior to orthopedic surgery   History of Present Illness:    Ronald Dunn is a 89 y.o. male with a hx of CAD with CABG 2017 hypertension with CKD hyperlipidemia  and chronic lower extremity edema evaluated by vascular surgery and found to have both  chronic venous insufficiency and lymphedema.  Last seen 06/23/2021.  He is scheduled for left total knee arthroplasty 11/08/2022.An echocardiogram performed 12/12/2020 because of persistent lower extremity edema and concerns of heart failure EF was normal 60 to 65% mild concentric LVH normal right ventricular function no evidence of pulmonary hypertension venous pressures were normal and diastolic function was normal.   Compliance with diet, lifestyle and medications: Yes  Despite [facility] and [facility] 3 days a week, his exercise tolerance exceeds 5 METS No angina shortness of breath orthopnea palpitation or syncope He has seen EP in the past for SVT He has bifascicular heart block and today is first-degree AV block. Prior to his elective surgery would like to see him wearing event monitor for 1 week to screen for significant bradycardia Otherwise I think he is demise for his planned procedure and can stop aspirin 1 week prior Given a copy of his EKG Past Medical History:  Diagnosis Date   Abnormal stress echocardiogram 08/16/2015   Overview:  With post exercise LV dilation   Arthritis 04/14/2017   Borderline anemia 05/26/2018   Chronic ischemic heart disease    CKD (chronic kidney disease) 04/14/2017   Coronary artery disease involving native coronary artery of native heart 04/14/2017   Coronary artery disease with stable angina pectoris ([place]) 09/01/2015   pt denies chest pain   DVT (deep venous thrombosis) ([place])    Right leg   Dyspnea 2017   Essential hypertension 07/22/2015   Gout 04/14/2017   Hyperlipidemia 07/22/2015   Incomplete right  bundle branch block (RBBB)    Noted on EKG 01/31/2018   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   Leukocytosis 04/14/2017   Nocturia    Obesity 04/14/2017   On amiodarone therapy 09/01/2015   PAF (paroxysmal atrial fibrillation) ([place]) 09/01/2015   pt unaware   Pleural effusion 09/22/2015   Overview:  Left, post CABG with US  guided thoracentesis   Premature ventricular contractions (PVCs) (VPCs) 04/14/2017   S/P CABG (coronary artery bypass graft) 04/14/2017   Skin cancer (melanoma) ([place]) 04/14/2017   Urinary frequency    Urinary urgency    Venous insufficiency    Vitamin B 12 deficiency     Past Surgical History:  Procedure Laterality Date   CARDIAC CATHETERIZATION  09/2015   COLONOSCOPY     CORNEAL TRANSPLANT Bilateral    CORONARY ARTERY BYPASS GRAFT  08/26/2015   FOOT SURGERY Right    TOTAL HIP ARTHROPLASTY Right 01/14/2021   Procedure: TOTAL HIP ARTHROPLASTY ANTERIOR APPROACH;  Surgeon: Gaynelle Arabian, MD;  Location: [facility];  Service: Orthopedics;  Laterality: Right;   TOTAL KNEE ARTHROPLASTY Right 08/14/2018   Procedure: TOTAL KNEE ARTHROPLASTY;  Surgeon: Vickey Huger, MD;  Location: [facility];  Service: Orthopedics;  Laterality: Right;    Current Medications: Current Meds  Medication Sig   allopurinol (ZYLOPRIM) 100 MG tablet Take 100 mg by mouth 2 (two) times daily.    aspirin EC 81 MG tablet Take 1 tablet (81 mg total) by mouth daily. Do not resume until after completing Xarelto   Ibuprofen 200 MG CAPS Take 200 mg by mouth every 8 (eight) hours as needed (PAIN).   loteprednol (LOTEMAX) 0.5 % ophthalmic suspension Place 1 drop into both eyes every morning.    [DISCONTINUED] ezetimibe (ZETIA) 10 MG tablet Take 1 tablet (10 mg total) by mouth daily. Patient must keep appointment for 09/03/22 for further refills. 2 nd attempt   [DISCONTINUED] spironolactone (ALDACTONE) 25 MG tablet Take 1 tablet by mouth once daily     Allergies:   Patient has no known allergies.   Social History   Socioeconomic History   Marital status: Married    Spouse name: Not on file   Number of children: Not on file   Years of education: Not on file   Highest education level: Not on file  Occupational History   Not on file  Tobacco Use   Smoking status: Never   Smokeless tobacco: Never  Vaping Use   Vaping Use:  Never used  Substance and Sexual Activity   Alcohol use: Not Currently    Alcohol/week: 1.0 standard drink of alcohol    Types: 1 Glasses of wine per week   Drug use: No   Sexual activity: Not on file  Other Topics Concern   Not on file  Social History Narrative   Not on file   Social Determinants of Health   Financial Resource Strain: Not on file  Food Insecurity: Not on file  Transportation Needs: Not on file  Physical Activity: Not on file  Stress: Not on file  Social Connections: Not on file     Family History: The patient's family history includes Heart disease in his father; Heart failure in his father. ROS:   Please see the history of present illness.    All other systems reviewed and are negative.  EKGs/Labs/Other Studies Reviewed:    The following studies were reviewed today:  EKG:  EKG ordered today and personally reviewed.  The ekg ordered today demonstrates right bundle  branch block left anterior hemiblock first-degree AV block which is new first-degree AV block from his previous EKG  Recent Labs: 06/23/2021 cholesterol 181 LDL 94 triglycerides 86 HDL 71 creatinine 1.16 potassium Recent Lipid Panel    Component Value Date/Time   CHOL 181 06/23/2021 1559   TRIG 86 06/23/2021 1559   HDL 71 06/23/2021 1559   CHOLHDL 2.5 06/23/2021 1559   LDLCALC 94 06/23/2021 1559    Physical Exam:    VS:  BP 120/70 (BP Location: Right Arm, Patient Position: Sitting)   Pulse 69   Ht 5' 9"$  (1.753 m)   Wt 166 lb (75.3 kg)   SpO2 99%   BMI 24.51 kg/m     Wt Readings from Last 3 Encounters:  09/03/22 166 lb (75.3 kg)  06/23/21 166 lb (75.3 kg)  01/29/21 164 lb (74.4 kg)     GEN:  Well nourished, well developed in no acute distress HEENT: Normal NECK: No JVD; No carotid bruits LYMPHATICS: No lymphadenopathy CARDIAC: RRR, no murmurs, rubs, gallops RESPIRATORY:  Clear to auscultation without rales, wheezing or rhonchi  ABDOMEN: Soft, non-tender,  non-distended MUSCULOSKELETAL:  No edema; No deformity  SKIN: Warm and dry NEUROLOGIC:  Alert and oriented x 3 PSYCHIATRIC:  Normal affect   Seen with Jerl Santos CMA chaperone   Signed, Shirlee More, MD  09/03/2022 10:44 AM    [place]

## 2022-09-03 ENCOUNTER — Ambulatory Visit: Payer: Medicare Other | Attending: Cardiology | Admitting: Cardiology

## 2022-09-03 ENCOUNTER — Other Ambulatory Visit: Payer: Self-pay

## 2022-09-03 ENCOUNTER — Ambulatory Visit: Payer: Medicare Other | Attending: Cardiology

## 2022-09-03 ENCOUNTER — Encounter: Payer: Self-pay | Admitting: Cardiology

## 2022-09-03 VITALS — BP 120/70 | HR 69 | Ht 69.0 in | Wt 166.0 lb

## 2022-09-03 DIAGNOSIS — I452 Bifascicular block: Secondary | ICD-10-CM | POA: Diagnosis not present

## 2022-09-03 DIAGNOSIS — N189 Chronic kidney disease, unspecified: Secondary | ICD-10-CM | POA: Insufficient documentation

## 2022-09-03 DIAGNOSIS — I10 Essential (primary) hypertension: Secondary | ICD-10-CM | POA: Diagnosis not present

## 2022-09-03 DIAGNOSIS — Z01810 Encounter for preprocedural cardiovascular examination: Secondary | ICD-10-CM

## 2022-09-03 DIAGNOSIS — I471 Supraventricular tachycardia, unspecified: Secondary | ICD-10-CM

## 2022-09-03 DIAGNOSIS — E785 Hyperlipidemia, unspecified: Secondary | ICD-10-CM

## 2022-09-03 DIAGNOSIS — I4510 Unspecified right bundle-branch block: Secondary | ICD-10-CM | POA: Diagnosis not present

## 2022-09-03 DIAGNOSIS — I2510 Atherosclerotic heart disease of native coronary artery without angina pectoris: Secondary | ICD-10-CM | POA: Diagnosis not present

## 2022-09-03 MED ORDER — EZETIMIBE 10 MG PO TABS: 10.0000 mg | ORAL_TABLET | Freq: Every day | ORAL | 3 refills | Status: DC

## 2022-09-03 MED ORDER — SPIRONOLACTONE 25 MG PO TABS: 25.0000 mg | ORAL_TABLET | Freq: Every day | ORAL | 3 refills | Status: DC

## 2022-09-03 NOTE — Addendum Note (Signed)
Addended by: Edwyna Shell I on: 09/03/2022 11:05 AM   Modules accepted: Orders

## 2022-09-03 NOTE — Telephone Encounter (Signed)
Refills for Zetia and Spironolactone sent to pharmacy

## 2022-09-03 NOTE — Patient Instructions (Signed)
Medication Instructions:  Your physician recommends that you continue on your current medications as directed. Please refer to the Current Medication list given to you today.  *If you need a refill on your cardiac medications before your next appointment, please call your pharmacy*   Lab Work: None If you have labs (blood work) drawn today and your tests are completely normal, you will receive your results only by: [place] (if you have MyChart) OR A paper copy in the mail If you have any lab test that is abnormal or we need to change your treatment, we will call you to review the results.   Testing/Procedures: A zio monitor was ordered today. It will remain on for 14 days. You will then return monitor and event diary in provided box. It takes 1-2 weeks for report to be downloaded and returned to [place]. We will call you with the results. If monitor falls off or has orange flashing light, please call Zio for further instructions.     Follow-Up: At [facility], you and your health needs are our priority.  As part of our continuing mission to provide you with exceptional heart care, we have created designated Provider Care Teams.  These Care Teams include your primary Cardiologist (physician) and Advanced Practice Providers (APPs -  Physician Assistants and Nurse Practitioners) who all work together to provide you with the care you need, when you need it.  We recommend signing up for the patient portal called "MyChart".  Sign up information is provided on this After Visit Summary.  MyChart is used to connect with patients for Virtual Visits (Telemedicine).  Patients are able to view lab/test results, encounter notes, upcoming appointments, etc.  Non-urgent messages can be sent to your provider as well.   To learn more about what you can do with MyChart, go to [email or web address].    Your next appointment:   6 month(s)  Provider:   Shirlee More, MD    Other  Instructions None  This visit was accompanied by Jerl Santos.

## 2022-09-17 DIAGNOSIS — D519 Vitamin B12 deficiency anemia, unspecified: Secondary | ICD-10-CM | POA: Diagnosis not present

## 2022-09-20 DIAGNOSIS — I10 Essential (primary) hypertension: Secondary | ICD-10-CM | POA: Diagnosis not present

## 2022-09-20 DIAGNOSIS — I2510 Atherosclerotic heart disease of native coronary artery without angina pectoris: Secondary | ICD-10-CM | POA: Diagnosis not present

## 2022-10-08 DIAGNOSIS — M1712 Unilateral primary osteoarthritis, left knee: Secondary | ICD-10-CM | POA: Diagnosis not present

## 2022-10-08 DIAGNOSIS — M25562 Pain in left knee: Secondary | ICD-10-CM | POA: Diagnosis not present

## 2022-10-11 DIAGNOSIS — I259 Chronic ischemic heart disease, unspecified: Secondary | ICD-10-CM | POA: Diagnosis not present

## 2022-10-11 DIAGNOSIS — M1990 Unspecified osteoarthritis, unspecified site: Secondary | ICD-10-CM | POA: Diagnosis not present

## 2022-10-11 DIAGNOSIS — I4892 Unspecified atrial flutter: Secondary | ICD-10-CM | POA: Diagnosis not present

## 2022-10-11 NOTE — H&P (Signed)
TOTAL KNEE ADMISSION H&P  Patient is being admitted for left total knee arthroplasty.  Subjective:  Chief Complaint: Left knee pain.  HPI: Ronald Dunn, 89 y.o. male has a history of pain and functional disability in the left knee due to arthritis and has failed non-surgical conservative treatments for greater than 12 weeks to include NSAID's and/or analgesics, corticosteriod injections, viscosupplementation injections, use of assistive devices, and activity modification. Onset of symptoms was gradual, starting 5 years ago with gradually worsening course since that time. The patient noted no past surgery on the left knee.  Patient currently rates pain in the left knee at 8 out of 10 with activity. Patient has worsening of pain with activity and weight bearing and pain that interferes with activities of daily living. Patient has evidence of subchondral cysts, subchondral sclerosis, periarticular osteophytes, and joint space narrowing by imaging studies. There is no active infection.  Patient Active Problem List   Diagnosis Date Noted   OA (osteoarthritis) of hip 01/14/2021   Primary osteoarthritis of right hip 01/14/2021   Vitamin B 12 deficiency    Urinary frequency    Urinary urgency    Venous insufficiency    Nocturia    Incomplete right bundle branch block (RBBB)    DVT (deep venous thrombosis) ([place])    Chronic ischemic heart disease    S/P right knee arthroscopy 10/14/2020   Patellar clunk syndrome of right knee 09/30/2020   Tachycardia 06/23/2020   Lower extremity edema 09/27/2018   Status post total right knee replacement 08/14/2018   Borderline anemia 05/2018   Chronic pain of both knees 05/16/2018   Coronary artery disease involving native coronary artery of native heart 04/14/2017   Leukocytosis 04/14/2017   S/P CABG (coronary artery bypass graft) 04/14/2017   CKD (chronic kidney disease) 04/14/2017   Arthritis 04/14/2017   Skin cancer (melanoma) ([place]) 04/14/2017   Gout  04/14/2017   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   Obesity 04/14/2017   Premature ventricular contractions (PVCs) (VPCs) 04/14/2017   Pleural effusion 09/22/2015   Coronary artery disease with stable angina pectoris ([place]) 09/01/2015   On amiodarone therapy 09/01/2015   PAF (paroxysmal atrial fibrillation) ([place]) 09/01/2015   Abnormal stress echocardiogram 08/16/2015   Dyspnea 2017   Essential hypertension 07/22/2015   Hyperlipidemia 07/22/2015    Past Medical History:  Diagnosis Date   Abnormal stress echocardiogram 08/16/2015   Overview:  With post exercise LV dilation   Arthritis 04/14/2017   Borderline anemia 05/26/2018   Chronic ischemic heart disease    CKD (chronic kidney disease) 04/14/2017   Coronary artery disease involving native coronary artery of native heart 04/14/2017   Coronary artery disease with stable angina pectoris ([place]) 09/01/2015   pt denies chest pain   DVT (deep venous thrombosis) ([place])    Right leg   Dyspnea 2017   Essential hypertension 07/22/2015   Gout 04/14/2017   Hyperlipidemia 07/22/2015   Incomplete right bundle branch block (RBBB)    Noted on EKG 01/31/2018   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   Leukocytosis 04/14/2017   Nocturia    Obesity 04/14/2017   On amiodarone therapy 09/01/2015   PAF (paroxysmal atrial fibrillation) ([place]) 09/01/2015   pt unaware   Pleural effusion 09/22/2015   Overview:  Left, post CABG with US guided thoracentesis   Premature ventricular contractions (PVCs) (VPCs) 04/14/2017   S/P CABG (coronary artery bypass graft) 04/14/2017   Skin cancer (melanoma) ([place]) 04/14/2017   Urinary frequency    Urinary urgency  Venous insufficiency    Vitamin B 12 deficiency     Past Surgical History:  Procedure Laterality Date   CARDIAC CATHETERIZATION  09/2015   COLONOSCOPY     CORNEAL TRANSPLANT Bilateral    CORONARY ARTERY BYPASS GRAFT  08/26/2015   FOOT SURGERY Right    TOTAL HIP ARTHROPLASTY Right 01/14/2021    Procedure: TOTAL HIP ARTHROPLASTY ANTERIOR APPROACH;  Surgeon: Gaynelle Arabian, MD;  Location: [facility];  Service: Orthopedics;  Laterality: Right;   TOTAL KNEE ARTHROPLASTY Right 08/14/2018   Procedure: TOTAL KNEE ARTHROPLASTY;  Surgeon: Vickey Huger, MD;  Location: [facility];  Service: Orthopedics;  Laterality: Right;    Prior to Admission medications   Medication Sig Start Date End Date Taking? Authorizing Provider  allopurinol (ZYLOPRIM) 100 MG tablet Take 100 mg by mouth 2 (two) times daily.     [provider]  aspirin EC 81 MG tablet Take 1 tablet (81 mg total) by mouth daily. Do not resume until after completing Xarelto 02/05/21   Fenton Foy D, PA-C  ezetimibe (ZETIA) 10 MG tablet Take 1 tablet (10 mg total) by mouth daily. 09/03/22   Richardo Priest, MD  Ibuprofen 200 MG CAPS Take 200 mg by mouth every 8 (eight) hours as needed (PAIN).    [provider]  loteprednol (LOTEMAX) 0.5 % ophthalmic suspension Place 1 drop into both eyes every morning.     [provider]  spironolactone (ALDACTONE) 25 MG tablet Take 1 tablet (25 mg total) by mouth daily. 09/03/22   Richardo Priest, MD    No Known Allergies  Social History   Socioeconomic History   Marital status: Married    Spouse name: Not on file   Number of children: Not on file   Years of education: Not on file   Highest education level: Not on file  Occupational History   Not on file  Tobacco Use   Smoking status: Never   Smokeless tobacco: Never  Vaping Use   Vaping Use: Never used  Substance and Sexual Activity   Alcohol use: Not Currently    Alcohol/week: 1.0 standard drink of alcohol    Types: 1 Glasses of wine per week   Drug use: No   Sexual activity: Not on file  Other Topics Concern   Not on file  Social History Narrative   Not on file   Social Determinants of Health   Financial Resource Strain: Not on file  Food Insecurity: Not on file  Transportation Needs: Not on file  Physical  Activity: Not on file  Stress: Not on file  Social Connections: Not on file  Intimate Partner Violence: Not on file    Tobacco Use: Low Risk  (09/03/2022)   Patient History    Smoking Tobacco Use: Never    Smokeless Tobacco Use: Never    Passive Exposure: Not on file   Social History   Substance and Sexual Activity  Alcohol Use Not Currently   Alcohol/week: 1.0 standard drink of alcohol   Types: 1 Glasses of wine per week    Family History  Problem Relation Age of Onset   Heart failure Father    Heart disease Father     ROS  Objective:  Physical Exam: The patient is a pleasant, well-developed male alert and oriented in no apparent distress.    Left Hip Exam:  The range of motion: normal without discomfort.  There is no tenderness over the greater trochanter bursa.    Left  Knee Exam:  No effusion present. No swelling present.  The range of motion is: 5 to 125 degrees.  Moderate crepitus on range of motion of the knee.  Positive medial joint line tenderness.  Positive lateral joint line tenderness.  The knee is stable.    The patient's sensation and motor function are intact in their lower extremities. Their distal pulses are 2+. The bilateral calves are soft and non-tender.   RESULTS   - bilateral AP and lateral of the left knee dated 07/02/2022 demonstrate the prosthesis on the right is in good position with no abnormalities. On the left, he has tricompartmental bone-on-bone changes and tibial subluxation.  Assessment/Plan:  End stage arthritis, left knee   The patient history, physical examination, clinical judgment of the provider and imaging studies are consistent with end stage degenerative joint disease of the left knee and total knee arthroplasty is deemed medically necessary. The treatment options including medical management, injection therapy arthroscopy and arthroplasty were discussed at length. The risks and benefits of total knee arthroplasty were  presented and reviewed. The risks due to aseptic loosening, infection, stiffness, patella tracking problems, thromboembolic complications and other imponderables were discussed. The patient acknowledged the explanation, agreed to proceed with the plan and consent was signed. Patient is being admitted for inpatient treatment for surgery, pain control, PT, OT, prophylactic antibiotics, VTE prophylaxis, progressive ambulation and ADLs and discharge planning. The patient is planning to be discharged  home with OPPT scheduled .   Patient's anticipated LOS is less than 2 midnights, meeting these requirements: - Younger than 30 - Lives within 1 hour of care - Has a competent adult at home to recover with post-op recover - NO history of  - Chronic pain requiring opiods  - Diabetes  - Heart failure  - Heart attack  - Stroke  - DVT/VTE  - Cardiac arrhythmia  - Respiratory Failure/COPD  - Renal failure  - Anemia  - Advanced Liver disease    Therapy Plans: ProPT Disposition: Home with caregiver during the day, daughter at night. Wife has dementia Planned DVT Prophylaxis: Xarelto (hx DVT) DME Needed: None PCP: Lovette Cliche, MD (clearance pending) Cardiologist: Shirlee More, MD (clearance in OV note, 09/03/22) TXA: Topical Allergies: NKDA Anesthesia Concerns: None BMI: 26.1 Last HgbA1c: Not diabetic  Pharmacy: [facility] in [place]  Other: -Pt will bring clearance form to PCP -Hx CAD, CABG. Recent heart monitor results normal as noted in Epic  - Patient was instructed on what medications to stop prior to surgery. - Follow-up visit in 2 weeks with Dr. Wynelle Link - Begin physical therapy following surgery - Pre-operative lab work as pre-surgical testing - Prescriptions will be provided in hospital at time of discharge  Shearon Balo, PA-C Orthopedic Surgery [facility] [place]

## 2022-10-21 DIAGNOSIS — R5381 Other malaise: Secondary | ICD-10-CM | POA: Diagnosis not present

## 2022-10-21 DIAGNOSIS — N400 Enlarged prostate without lower urinary tract symptoms: Secondary | ICD-10-CM | POA: Diagnosis not present

## 2022-10-21 DIAGNOSIS — D519 Vitamin B12 deficiency anemia, unspecified: Secondary | ICD-10-CM | POA: Diagnosis not present

## 2022-10-21 DIAGNOSIS — R5383 Other fatigue: Secondary | ICD-10-CM | POA: Diagnosis not present

## 2022-10-21 DIAGNOSIS — R3589 Other polyuria: Secondary | ICD-10-CM | POA: Diagnosis not present

## 2022-10-26 NOTE — Patient Instructions (Signed)
SURGICAL WAITING ROOM VISITATION  Patients having surgery or a procedure may have no more than 2 support people in the waiting area - these visitors may rotate.    Children under the age of 8 must have an adult with them who is not the patient.  Due to an increase in RSV and influenza rates and associated hospitalizations, children ages 3 and under may not visit patients in [place].  If the patient needs to stay at the hospital during part of their recovery, the visitor guidelines for inpatient rooms apply. Pre-op nurse will coordinate an appropriate time for 1 support person to accompany patient in pre-op.  This support person may not rotate.    Please refer to the [facility] website for the visitor guidelines for Inpatients (after your surgery is over and you are in a regular room).       Your procedure is scheduled on:  11/08/2022    Report to [facility] Main Entrance    Report to admitting at   0900AM   Call this number if you have problems the morning of surgery [phone]   Do not eat food :After Midnight.   After Midnight you may have the following liquids until __ 0830____ AM DAY OF SURGERY  Water Non-Citrus Juices (without pulp, NO RED-Apple, White grape, White cranberry) Black Coffee (NO MILK/CREAM OR CREAMERS, sugar ok)  Clear Tea (NO MILK/CREAM OR CREAMERS, sugar ok) regular and decaf                             Plain Jell-O (NO RED)                                           Fruit ices (not with fruit pulp, NO RED)                                     Popsicles (NO RED)                                                               Sports drinks like Gatorade (NO RED)                    The day of surgery:  Drink ONE (1) Pre-Surgery Clear Ensure or G2 at AM the morning of surgery. Drink in one sitting. Do not sip.  This drink was given to you during your hospital  pre-op appointment visit. Nothing else to drink after completing the   Pre-Surgery Clear Ensure or G2.          If you have questions, please contact your surgeon's office.      Oral Hygiene is also important to reduce your risk of infection.                                    Remember - BRUSH YOUR TEETH THE MORNING OF SURGERY WITH YOUR REGULAR TOOTHPASTE  DENTURES WILL BE REMOVED PRIOR  TO SURGERY PLEASE DO NOT APPLY "Poly grip" OR ADHESIVES!!!   Do NOT smoke after Midnight   Take these medicines the morning of surgery with A SIP OF WATER:  allopurinol, zyrtec if needed, eye drops as usual   DO NOT TAKE ANY ORAL DIABETIC MEDICATIONS DAY OF YOUR SURGERY  Bring CPAP mask and tubing day of surgery.                              You may not have any metal on your body including hair pins, jewelry, and body piercing             Do not wear make-up, lotions, powders, perfumes/cologne, or deodorant  Do not wear nail polish including gel and S&S, artificial/acrylic nails, or any other type of covering on natural nails including finger and toenails. If you have artificial nails, gel coating, etc. that needs to be removed by a nail salon please have this removed prior to surgery or surgery may need to be canceled/ delayed if the surgeon/ anesthesia feels like they are unable to be safely monitored.   Do not shave  48 hours prior to surgery.               Men may shave face and neck.   Do not bring valuables to the hospital. [place].   Contacts, glasses, dentures or bridgework may not be worn into surgery.   Bring small overnight bag day of surgery.   DO NOT [place]. PHARMACY WILL DISPENSE MEDICATIONS LISTED ON YOUR MEDICATION LIST TO YOU DURING YOUR ADMISSION [place]!    Patients discharged on the day of surgery will not be allowed to drive home.  Someone NEEDS to stay with you for the first 24 hours after anesthesia.   Special Instructions: Bring a copy of  your healthcare power of attorney and living will documents the day of surgery if you haven't scanned them before.              Please read over the following fact sheets you were given: [place] [phone]   If you received a COVID test during your pre-op visit  it is requested that you wear a mask when out in public, stay away from anyone that may not be feeling well and notify your surgeon if you develop symptoms. If you test positive for Covid or have been in contact with anyone that has tested positive in the last 10 days please notify you surgeon.

## 2022-10-27 ENCOUNTER — Encounter (HOSPITAL_COMMUNITY): Payer: Self-pay

## 2022-10-27 ENCOUNTER — Other Ambulatory Visit: Payer: Self-pay

## 2022-10-27 ENCOUNTER — Encounter (HOSPITAL_COMMUNITY)
Admission: RE | Admit: 2022-10-27 | Discharge: 2022-10-27 | Disposition: A | Discharge status: Home / Self Care | Payer: Medicare Other | Source: Ambulatory Visit | Attending: Orthopedic Surgery | Admitting: Orthopedic Surgery

## 2022-10-27 ENCOUNTER — Telehealth: Payer: Self-pay

## 2022-10-27 VITALS — BP 120/70 | HR 72 | Temp 98.2°F | Resp 16 | Ht 68.0 in | Wt 163.0 lb

## 2022-10-27 DIAGNOSIS — E785 Hyperlipidemia, unspecified: Secondary | ICD-10-CM | POA: Insufficient documentation

## 2022-10-27 DIAGNOSIS — Z01818 Encounter for other preprocedural examination: Secondary | ICD-10-CM | POA: Insufficient documentation

## 2022-10-27 DIAGNOSIS — M1712 Unilateral primary osteoarthritis, left knee: Secondary | ICD-10-CM | POA: Diagnosis not present

## 2022-10-27 DIAGNOSIS — Z951 Presence of aortocoronary bypass graft: Secondary | ICD-10-CM | POA: Insufficient documentation

## 2022-10-27 DIAGNOSIS — I129 Hypertensive chronic kidney disease with stage 1 through stage 4 chronic kidney disease, or unspecified chronic kidney disease: Secondary | ICD-10-CM | POA: Insufficient documentation

## 2022-10-27 DIAGNOSIS — N189 Chronic kidney disease, unspecified: Secondary | ICD-10-CM | POA: Diagnosis not present

## 2022-10-27 DIAGNOSIS — Z8679 Personal history of other diseases of the circulatory system: Secondary | ICD-10-CM | POA: Diagnosis not present

## 2022-10-27 DIAGNOSIS — I480 Paroxysmal atrial fibrillation: Secondary | ICD-10-CM | POA: Insufficient documentation

## 2022-10-27 DIAGNOSIS — I2510 Atherosclerotic heart disease of native coronary artery without angina pectoris: Secondary | ICD-10-CM | POA: Diagnosis not present

## 2022-10-27 LAB — SURGICAL PCR SCREEN
MRSA, PCR: NEGATIVE
Staphylococcus aureus: NEGATIVE

## 2022-10-27 NOTE — Progress Notes (Addendum)
Anesthesia Review:  PCP: DR Gwendlyn Deutscher  LOV 10/21/22 - Have called and requested cbc and BMP done and OV note.  PT reports at preop that DR Jeanie Sewer took him off of Spironolactone because blood pressure was low on 10/21/22 and pt was waking up at nite per pt.  They are to fax.  Cardiologist : Norman Herrlich LOV 09/03/22 - pt states EKG done.  Have called [place] office and requested EKG tracing since not in epic.  Placed me on hold and then cut me off.  Will call back again.   Chest x-ray  Monitor- 09/22/22 : EKG : done 2/24 to   be faxed over requested on 10/27/22 09/03/22 EKG on chart.  Echo : 12/12/20  Stress test: Cardiac Cath :  Activity level: can do a flgiht of stairs without difficulty  Sleep Study/ CPAP : no cpap  Fasting Blood Sugar :      / Checks Blood Sugar -- times a day:   Blood Thinner/ Instructions /Last Dose: ASA / Instructions/ Last Dose :    Pt was 25 minutes late for preop appt.  Completed med hx and instructions.     81 mg aspiirn   CBC/DIFF and CMP done 10/21/2022 on chart from  PCP office.    Clearance dated 10/12/22- DR Gwendlyn Deutscher on chart.

## 2022-10-27 NOTE — Telephone Encounter (Signed)
Received a message from the call center stating that Ronald Dunn was calling to get results for this patient. They hung up the phone before the call could be transferred to the office. I called the phone number they left and left a message for them to call the office back.

## 2022-11-01 NOTE — Anesthesia Preprocedure Evaluation (Addendum)
Anesthesia Evaluation  Patient identified by MRN, date of birth, ID band Patient awake    Reviewed: Allergy & Precautions, H&P , NPO status , Patient's Chart, lab work & pertinent test results  Airway Mallampati: II  TM Distance: >3 FB Neck ROM: Full    Dental no notable dental hx.    Pulmonary neg pulmonary ROS   Pulmonary exam normal breath sounds clear to auscultation       Cardiovascular + CAD and + CABG  Normal cardiovascular exam Rhythm:Regular Rate:Normal     Neuro/Psych negative neurological ROS  negative psych ROS   GI/Hepatic negative GI ROS, Neg liver ROS,,,  Endo/Other  negative endocrine ROS    Renal/GU Renal InsufficiencyRenal disease  negative genitourinary   Musculoskeletal negative musculoskeletal ROS (+)    Abdominal   Peds negative pediatric ROS (+)  Hematology negative hematology ROS (+)   Anesthesia Other Findings   Reproductive/Obstetrics negative OB ROS                             Anesthesia Physical Anesthesia Plan  ASA: 3  Anesthesia Plan: Spinal   Post-op Pain Management: Regional block*   Induction: Intravenous  PONV Risk Score and Plan: 1 and Treatment may vary due to age or medical condition and Propofol infusion  Airway Management Planned: Simple Face Mask  Additional Equipment:   Intra-op Plan:   Post-operative Plan:   Informed Consent: I have reviewed the patients History and Physical, chart, labs and discussed the procedure including the risks, benefits and alternatives for the proposed anesthesia with the patient or authorized representative who has indicated his/her understanding and acceptance.     Dental advisory given  Plan Discussed with: CRNA and Surgeon  Anesthesia Plan Comments: (See PAT note 10/27/2022)       Anesthesia Quick Evaluation

## 2022-11-01 NOTE — Progress Notes (Signed)
Anesthesia Chart Review   Case: [id number] Date/Time: 11/08/22 1125   Procedure: TOTAL KNEE ARTHROPLASTY (Left: Knee)   Anesthesia type: Choice   Pre-op diagnosis: left knee osteoarthritis   [facility] 09 / [facility]   Surgeons: Ollen Gross, MD       DISCUSSION:89 y.o. never smoker with h/o HTN, CAD (CABG), RBBB, PAF, PVCs, CKD, left knee OA scheduled for above procedure 11/08/2022 with Dr. Ollen Gross.   Pt last seen by cardiology 09/03/2022. Per OV note, "His planned surgery is elective intermediate risk. His cardiovascular problems include stable CAD following CABG well-preserved exercise tolerance well-controlled hypertension hyperlipidemia but he also has bifascicular heart block cannot taste EKG is first-degree AV block and will utilize a 1 week event monitor to screen for significant bradycardia prior to his elective surgery I will put an addendum to this note otherwise he is optimized and ready for surgical procedure."  Monitor similar to previous evaluation, predominant underlying rhythm sinus rhythm, brief episodes of rapid heart rhythm. No additional cardiac meds recommended.   Anticipate pt can proceed with planned procedure barring acute status change.   VS: BP 120/70   Pulse 72   Temp 36.8 C (Oral)   Resp 16   Ht 5\' 8"  (1.727 m)   Wt 73.9 kg   SpO2 99%   BMI 24.78 kg/m   PROVIDERS: Noni Saupe, MD is PCP   Baldo Daub, MD is Cardiologist  LABS: Labs reviewed: Acceptable for surgery. and labs on chart (all labs ordered are listed, but only abnormal results are displayed)  Labs Reviewed  SURGICAL PCR SCREEN     IMAGES:   EKG:   CV: Echo 12/12/2020 1. Left ventricular ejection fraction, by estimation, is 60 to 65%. The  left ventricle has normal function. The left ventricle has no regional  wall motion abnormalities. There is mild left ventricular hypertrophy.   2. Right ventricular systolic function is normal. The right ventricular   size is normal. There is normal pulmonary artery systolic pressure.   3. The mitral valve is normal in structure. Mild mitral valve  regurgitation. No evidence of mitral stenosis.   4. The aortic valve is normal in structure. Aortic valve regurgitation is  not visualized. Mild aortic valve sclerosis is present, with no evidence  of aortic valve stenosis.   5. Descending aorta 2.3 cm.   6. The inferior vena cava is normal in size with greater than 50%  respiratory variability, suggesting right atrial pressure of 3 mmHg.  Past Medical History:  Diagnosis Date   Abnormal stress echocardiogram 08/16/2015   Overview:  With post exercise LV dilation   Arthritis 04/14/2017   Borderline anemia 05/26/2018   pt denies   Chronic ischemic heart disease    CKD (chronic kidney disease) 04/14/2017   Coronary artery disease involving native coronary artery of native heart 04/14/2017   Coronary artery disease with stable angina pectoris 09/01/2015   pt denies chest pain   DVT (deep venous thrombosis)    Right leg   Dyspnea 2017   pt denies on proep of 10/27/22   Essential hypertension 07/22/2015   off of bl/p meds since 10/21/22 per DR [place]   Gout 04/14/2017   Hyperlipidemia 07/22/2015   Incomplete right bundle branch block (RBBB)    Noted on EKG 01/31/2018   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   Leukocytosis 04/14/2017   Nocturia    Obesity 04/14/2017   On amiodarone therapy 09/01/2015   PAF (  paroxysmal atrial fibrillation) 09/01/2015   pt unaware   Pleural effusion 09/22/2015   Overview:  Left, post CABG with US guided thoracentesis   Premature ventricular contractions (PVCs) (VPCs) 04/14/2017   S/P CABG (coronary artery bypass graft) 04/14/2017   Skin cancer (melanoma) 04/14/2017   Urinary frequency    Urinary urgency    Venous insufficiency    Vitamin B 12 deficiency     Past Surgical History:  Procedure Laterality Date   CARDIAC CATHETERIZATION  09/2015   COLONOSCOPY      CORNEAL TRANSPLANT Bilateral    CORONARY ARTERY BYPASS GRAFT  08/26/2015   FOOT SURGERY Right    TOTAL HIP ARTHROPLASTY Right 01/14/2021   Procedure: TOTAL HIP ARTHROPLASTY ANTERIOR APPROACH;  Surgeon: Ollen Gross, MD;  Location[facility];  Service: Orthopedics;  Laterality: Right;   TOTAL KNEE ARTHROPLASTY Right 08/14/2018   Procedure: TOTAL KNEE ARTHROPLASTY;  Surgeon: Dannielle Huh, MD;  Location: [facility];  Service: Orthopedics;  Laterality: Right;    MEDICATIONS:  allopurinol (ZYLOPRIM) 100 MG tablet   aspirin EC 81 MG tablet   cetirizine (ZYRTEC) 10 MG tablet   ezetimibe (ZETIA) 10 MG tablet   Ibuprofen 200 MG CAPS   Multiple Vitamin (MULTIVITAMIN WITH MINERALS) TABS tablet   prednisoLONE acetate (PRED FORTE) 1 % ophthalmic suspension   spironolactone (ALDACTONE) 25 MG tablet   No current facility-administered medications for this encounter.    Jodell Cipro Ward, PA-C [facility] Pre-Surgical Testing [phone]

## 2022-11-08 ENCOUNTER — Other Ambulatory Visit: Payer: Self-pay

## 2022-11-08 ENCOUNTER — Encounter (HOSPITAL_COMMUNITY): Payer: Self-pay | Admitting: Orthopedic Surgery

## 2022-11-08 ENCOUNTER — Encounter (HOSPITAL_COMMUNITY)
Admission: RE | Disposition: A | Discharge status: Home / Self Care | Payer: Self-pay | Source: Home / Self Care | Attending: Orthopedic Surgery

## 2022-11-08 ENCOUNTER — Ambulatory Visit (HOSPITAL_COMMUNITY): Payer: Medicare Other | Admitting: Physician Assistant

## 2022-11-08 ENCOUNTER — Ambulatory Visit (HOSPITAL_BASED_OUTPATIENT_CLINIC_OR_DEPARTMENT_OTHER): Payer: Medicare Other | Admitting: Certified Registered Nurse Anesthetist

## 2022-11-08 ENCOUNTER — Observation Stay (HOSPITAL_COMMUNITY)
Admission: RE | Admit: 2022-11-08 | Discharge: 2022-11-09 | Disposition: A | Discharge status: Home / Self Care | Payer: Medicare Other | Attending: Orthopedic Surgery | Admitting: Orthopedic Surgery

## 2022-11-08 DIAGNOSIS — I129 Hypertensive chronic kidney disease with stage 1 through stage 4 chronic kidney disease, or unspecified chronic kidney disease: Secondary | ICD-10-CM | POA: Diagnosis not present

## 2022-11-08 DIAGNOSIS — M1712 Unilateral primary osteoarthritis, left knee: Secondary | ICD-10-CM

## 2022-11-08 DIAGNOSIS — G8918 Other acute postprocedural pain: Secondary | ICD-10-CM | POA: Diagnosis not present

## 2022-11-08 DIAGNOSIS — Z85820 Personal history of malignant melanoma of skin: Secondary | ICD-10-CM | POA: Insufficient documentation

## 2022-11-08 DIAGNOSIS — Z96641 Presence of right artificial hip joint: Secondary | ICD-10-CM | POA: Insufficient documentation

## 2022-11-08 DIAGNOSIS — I480 Paroxysmal atrial fibrillation: Secondary | ICD-10-CM | POA: Diagnosis not present

## 2022-11-08 DIAGNOSIS — Z951 Presence of aortocoronary bypass graft: Secondary | ICD-10-CM

## 2022-11-08 DIAGNOSIS — Z01818 Encounter for other preprocedural examination: Secondary | ICD-10-CM

## 2022-11-08 DIAGNOSIS — N289 Disorder of kidney and ureter, unspecified: Secondary | ICD-10-CM

## 2022-11-08 DIAGNOSIS — Z79899 Other long term (current) drug therapy: Secondary | ICD-10-CM | POA: Insufficient documentation

## 2022-11-08 DIAGNOSIS — Z96651 Presence of right artificial knee joint: Secondary | ICD-10-CM | POA: Diagnosis not present

## 2022-11-08 DIAGNOSIS — M179 Osteoarthritis of knee, unspecified: Secondary | ICD-10-CM

## 2022-11-08 DIAGNOSIS — I2510 Atherosclerotic heart disease of native coronary artery without angina pectoris: Secondary | ICD-10-CM | POA: Diagnosis not present

## 2022-11-08 DIAGNOSIS — Z86718 Personal history of other venous thrombosis and embolism: Secondary | ICD-10-CM | POA: Diagnosis not present

## 2022-11-08 DIAGNOSIS — Z7982 Long term (current) use of aspirin: Secondary | ICD-10-CM | POA: Insufficient documentation

## 2022-11-08 DIAGNOSIS — N189 Chronic kidney disease, unspecified: Secondary | ICD-10-CM | POA: Insufficient documentation

## 2022-11-08 HISTORY — PX: TOTAL KNEE ARTHROPLASTY: SHX125

## 2022-11-08 SURGERY — ARTHROPLASTY, KNEE, TOTAL
Anesthesia: Spinal | Site: Knee | Laterality: Left

## 2022-11-08 MED ORDER — SODIUM CHLORIDE (PF) 0.9 % IJ SOLN
INTRAMUSCULAR | Status: AC
  Filled 2022-11-08: qty 10

## 2022-11-08 MED ORDER — BUPIVACAINE LIPOSOME 1.3 % IJ SUSP
INTRAMUSCULAR | Status: DC | PRN
  Administered 2022-11-08: 20 mL

## 2022-11-08 MED ORDER — ONDANSETRON HCL 4 MG/2ML IJ SOLN
INTRAMUSCULAR | Status: DC | PRN
  Administered 2022-11-08: 4 mg via INTRAVENOUS

## 2022-11-08 MED ORDER — DEXAMETHASONE SODIUM PHOSPHATE 10 MG/ML IJ SOLN
10.0000 mg | Freq: Once | INTRAMUSCULAR | Status: AC
  Administered 2022-11-09: 10 mg via INTRAVENOUS
  Filled 2022-11-08: qty 1

## 2022-11-08 MED ORDER — OXYCODONE HCL 5 MG/5ML PO SOLN: 5.0000 mg | Freq: Once | ORAL | Status: DC | PRN

## 2022-11-08 MED ORDER — POLYETHYLENE GLYCOL 3350 17 G PO PACK: 17.0000 g | PACK | Freq: Every day | ORAL | Status: DC | PRN

## 2022-11-08 MED ORDER — CEFAZOLIN SODIUM-DEXTROSE 2-4 GM/100ML-% IV SOLN
2.0000 g | INTRAVENOUS | Status: AC
  Administered 2022-11-08: 2 g via INTRAVENOUS
  Filled 2022-11-08: qty 100

## 2022-11-08 MED ORDER — ACETAMINOPHEN 10 MG/ML IV SOLN
1000.0000 mg | Freq: Four times a day (QID) | INTRAVENOUS | Status: DC
  Administered 2022-11-08: 1000 mg via INTRAVENOUS
  Filled 2022-11-08: qty 100

## 2022-11-08 MED ORDER — TRANEXAMIC ACID 1000 MG/10ML IV SOLN
INTRAVENOUS | Status: DC | PRN
  Administered 2022-11-08: 2000 mg via TOPICAL

## 2022-11-08 MED ORDER — PROPOFOL 500 MG/50ML IV EMUL
INTRAVENOUS | Status: DC | PRN
  Administered 2022-11-08: 80 ug/kg/min via INTRAVENOUS

## 2022-11-08 MED ORDER — CEFAZOLIN SODIUM-DEXTROSE 2-4 GM/100ML-% IV SOLN
2.0000 g | Freq: Four times a day (QID) | INTRAVENOUS | Status: AC
  Administered 2022-11-08 (×2): 2 g via INTRAVENOUS
  Filled 2022-11-08 (×2): qty 100

## 2022-11-08 MED ORDER — EZETIMIBE 10 MG PO TABS
10.0000 mg | ORAL_TABLET | Freq: Every day | ORAL | Status: DC
  Administered 2022-11-09: 10 mg via ORAL
  Filled 2022-11-08: qty 1

## 2022-11-08 MED ORDER — LACTATED RINGERS IV SOLN: INTRAVENOUS | Status: DC

## 2022-11-08 MED ORDER — BUPIVACAINE LIPOSOME 1.3 % IJ SUSP
INTRAMUSCULAR | Status: AC
  Filled 2022-11-08: qty 20

## 2022-11-08 MED ORDER — METHOCARBAMOL 500 MG IVPB - SIMPLE MED: 500.0000 mg | Freq: Four times a day (QID) | INTRAVENOUS | Status: DC | PRN

## 2022-11-08 MED ORDER — ONDANSETRON HCL 4 MG PO TABS: 4.0000 mg | ORAL_TABLET | Freq: Four times a day (QID) | ORAL | Status: DC | PRN

## 2022-11-08 MED ORDER — SODIUM CHLORIDE 0.9 % IR SOLN
Status: DC | PRN
  Administered 2022-11-08: 3000 mL
  Administered 2022-11-08: 1000 mL

## 2022-11-08 MED ORDER — SPIRONOLACTONE 25 MG PO TABS
25.0000 mg | ORAL_TABLET | Freq: Every day | ORAL | Status: DC
  Filled 2022-11-08: qty 1

## 2022-11-08 MED ORDER — BUPIVACAINE LIPOSOME 1.3 % IJ SUSP: 20.0000 mL | Freq: Once | INTRAMUSCULAR | Status: DC

## 2022-11-08 MED ORDER — FENTANYL CITRATE PF 50 MCG/ML IJ SOSY
50.0000 ug | PREFILLED_SYRINGE | INTRAMUSCULAR | Status: DC
  Administered 2022-11-08: 50 ug via INTRAVENOUS
  Filled 2022-11-08: qty 2

## 2022-11-08 MED ORDER — METHOCARBAMOL 500 MG PO TABS
500.0000 mg | ORAL_TABLET | Freq: Four times a day (QID) | ORAL | Status: DC | PRN
  Administered 2022-11-08 – 2022-11-09 (×2): 500 mg via ORAL
  Filled 2022-11-08 (×2): qty 1

## 2022-11-08 MED ORDER — CHLORHEXIDINE GLUCONATE 0.12 % MT SOLN
15.0000 mL | Freq: Once | OROMUCOSAL | Status: AC
  Administered 2022-11-08: 15 mL via OROMUCOSAL

## 2022-11-08 MED ORDER — ORAL CARE MOUTH RINSE: 15.0000 mL | Freq: Once | OROMUCOSAL | Status: AC

## 2022-11-08 MED ORDER — ONDANSETRON HCL 4 MG/2ML IJ SOLN: 4.0000 mg | Freq: Once | INTRAMUSCULAR | Status: DC | PRN

## 2022-11-08 MED ORDER — MENTHOL 3 MG MT LOZG: 1.0000 | LOZENGE | OROMUCOSAL | Status: DC | PRN

## 2022-11-08 MED ORDER — DIPHENHYDRAMINE HCL 12.5 MG/5ML PO ELIX: 12.5000 mg | ORAL_SOLUTION | ORAL | Status: DC | PRN

## 2022-11-08 MED ORDER — DEXAMETHASONE SODIUM PHOSPHATE 10 MG/ML IJ SOLN
INTRAMUSCULAR | Status: AC
  Filled 2022-11-08: qty 1

## 2022-11-08 MED ORDER — OXYCODONE HCL 5 MG PO TABS: 5.0000 mg | ORAL_TABLET | Freq: Once | ORAL | Status: DC | PRN

## 2022-11-08 MED ORDER — OXYCODONE HCL 5 MG PO TABS
5.0000 mg | ORAL_TABLET | ORAL | Status: DC | PRN
  Administered 2022-11-08 (×3): 5 mg via ORAL
  Administered 2022-11-09: 10 mg via ORAL
  Filled 2022-11-08: qty 2
  Filled 2022-11-08 (×2): qty 1
  Filled 2022-11-08: qty 2

## 2022-11-08 MED ORDER — BISACODYL 10 MG RE SUPP: 10.0000 mg | Freq: Every day | RECTAL | Status: DC | PRN

## 2022-11-08 MED ORDER — FLEET ENEMA 7-19 GM/118ML RE ENEM: 1.0000 | ENEMA | Freq: Once | RECTAL | Status: DC | PRN

## 2022-11-08 MED ORDER — DEXAMETHASONE SODIUM PHOSPHATE 10 MG/ML IJ SOLN
8.0000 mg | Freq: Once | INTRAMUSCULAR | Status: AC
  Administered 2022-11-08: 8 mg via INTRAVENOUS

## 2022-11-08 MED ORDER — SODIUM CHLORIDE 0.9 % IV SOLN: INTRAVENOUS | Status: DC

## 2022-11-08 MED ORDER — TRANEXAMIC ACID 1000 MG/10ML IV SOLN
2000.0000 mg | Freq: Once | INTRAVENOUS | Status: DC
  Filled 2022-11-08: qty 20

## 2022-11-08 MED ORDER — PROPOFOL 10 MG/ML IV BOLUS
INTRAVENOUS | Status: AC
  Filled 2022-11-08: qty 20

## 2022-11-08 MED ORDER — EPHEDRINE SULFATE-NACL 50-0.9 MG/10ML-% IV SOSY
PREFILLED_SYRINGE | INTRAVENOUS | Status: DC | PRN
  Administered 2022-11-08: 10 mg via INTRAVENOUS

## 2022-11-08 MED ORDER — STERILE WATER FOR IRRIGATION IR SOLN
Status: DC | PRN
  Administered 2022-11-08: 2000 mL

## 2022-11-08 MED ORDER — ALLOPURINOL 100 MG PO TABS
100.0000 mg | ORAL_TABLET | Freq: Two times a day (BID) | ORAL | Status: DC
  Administered 2022-11-08 – 2022-11-09 (×3): 100 mg via ORAL
  Filled 2022-11-08 (×3): qty 1

## 2022-11-08 MED ORDER — ROPIVACAINE HCL 7.5 MG/ML IJ SOLN
INTRAMUSCULAR | Status: DC | PRN
  Administered 2022-11-08: 20 mL via PERINEURAL

## 2022-11-08 MED ORDER — TRANEXAMIC ACID FOR EPISTAXIS
500.0000 mg | Freq: Once | TOPICAL | Status: DC
  Filled 2022-11-08: qty 10

## 2022-11-08 MED ORDER — PHENOL 1.4 % MT LIQD: 1.0000 | OROMUCOSAL | Status: DC | PRN

## 2022-11-08 MED ORDER — METOCLOPRAMIDE HCL 5 MG PO TABS: 5.0000 mg | ORAL_TABLET | Freq: Three times a day (TID) | ORAL | Status: DC | PRN

## 2022-11-08 MED ORDER — BUPIVACAINE IN DEXTROSE 0.75-8.25 % IT SOLN
INTRATHECAL | Status: DC | PRN
  Administered 2022-11-08: 1.4 mL via INTRATHECAL

## 2022-11-08 MED ORDER — HYDROMORPHONE HCL 1 MG/ML IJ SOLN: 0.2500 mg | INTRAMUSCULAR | Status: DC | PRN

## 2022-11-08 MED ORDER — METOCLOPRAMIDE HCL 5 MG/ML IJ SOLN: 5.0000 mg | Freq: Three times a day (TID) | INTRAMUSCULAR | Status: DC | PRN

## 2022-11-08 MED ORDER — PROPOFOL 1000 MG/100ML IV EMUL
INTRAVENOUS | Status: AC
  Filled 2022-11-08: qty 100

## 2022-11-08 MED ORDER — SODIUM CHLORIDE (PF) 0.9 % IJ SOLN
INTRAMUSCULAR | Status: DC | PRN
  Administered 2022-11-08: 60 mL

## 2022-11-08 MED ORDER — RIVAROXABAN 10 MG PO TABS
10.0000 mg | ORAL_TABLET | Freq: Every day | ORAL | Status: DC
  Administered 2022-11-09: 10 mg via ORAL
  Filled 2022-11-08: qty 1

## 2022-11-08 MED ORDER — DOCUSATE SODIUM 100 MG PO CAPS
100.0000 mg | ORAL_CAPSULE | Freq: Two times a day (BID) | ORAL | Status: DC
  Administered 2022-11-08 – 2022-11-09 (×3): 100 mg via ORAL
  Filled 2022-11-08 (×3): qty 1

## 2022-11-08 MED ORDER — POVIDONE-IODINE 10 % EX SWAB: 2.0000 | Freq: Once | CUTANEOUS | Status: DC

## 2022-11-08 MED ORDER — HYDROMORPHONE HCL 1 MG/ML IJ SOLN
0.5000 mg | INTRAMUSCULAR | Status: DC | PRN
  Administered 2022-11-08: 0.5 mg via INTRAVENOUS
  Filled 2022-11-08: qty 1

## 2022-11-08 MED ORDER — ONDANSETRON HCL 4 MG/2ML IJ SOLN
INTRAMUSCULAR | Status: AC
  Filled 2022-11-08: qty 2

## 2022-11-08 MED ORDER — SODIUM CHLORIDE (PF) 0.9 % IJ SOLN
INTRAMUSCULAR | Status: AC
  Filled 2022-11-08: qty 50

## 2022-11-08 MED ORDER — ACETAMINOPHEN 500 MG PO TABS
1000.0000 mg | ORAL_TABLET | Freq: Four times a day (QID) | ORAL | Status: AC
  Administered 2022-11-08 – 2022-11-09 (×4): 1000 mg via ORAL
  Filled 2022-11-08 (×4): qty 2

## 2022-11-08 MED ORDER — ONDANSETRON HCL 4 MG/2ML IJ SOLN: 4.0000 mg | Freq: Four times a day (QID) | INTRAMUSCULAR | Status: DC | PRN

## 2022-11-08 MED ORDER — EPHEDRINE 5 MG/ML INJ
INTRAVENOUS | Status: AC
  Filled 2022-11-08: qty 5

## 2022-11-08 MED ORDER — TRAMADOL HCL 50 MG PO TABS
50.0000 mg | ORAL_TABLET | Freq: Four times a day (QID) | ORAL | Status: DC | PRN
  Administered 2022-11-08: 50 mg via ORAL
  Administered 2022-11-09: 100 mg via ORAL
  Filled 2022-11-08: qty 2
  Filled 2022-11-08: qty 1

## 2022-11-08 SURGICAL SUPPLY — 61 items
ATTUNE MED DOME PAT 41 KNEE (Knees) IMPLANT
ATTUNE PS FEM LT SZ 6 CEM KNEE (Femur) IMPLANT
ATTUNE PSRP INSR SZ6 12 KNEE (Insert) IMPLANT
BAG COUNTER SPONGE SURGICOUNT (BAG) IMPLANT
BAG SPEC THK2 15X12 ZIP CLS (MISCELLANEOUS) ×1
BAG SPNG CNTER NS LX DISP (BAG)
BAG ZIPLOCK 12X15 (MISCELLANEOUS) ×1 IMPLANT
BASE TIBIAL ROT PLAT SZ 7 KNEE (Knees) IMPLANT
BLADE SAG 18X100X1.27 (BLADE) ×1 IMPLANT
BLADE SAW SGTL 11.0X1.19X90.0M (BLADE) ×1 IMPLANT
BNDG CMPR 5X62 HK CLSR LF (GAUZE/BANDAGES/DRESSINGS) ×1
BNDG CMPR MED 10X6 ELC LF (GAUZE/BANDAGES/DRESSINGS) ×1
BNDG ELASTIC 6INX 5YD STR LF (GAUZE/BANDAGES/DRESSINGS) ×1 IMPLANT
BNDG ELASTIC 6X10 VLCR STRL LF (GAUZE/BANDAGES/DRESSINGS) IMPLANT
BOWL SMART MIX CTS (DISPOSABLE) ×1 IMPLANT
BSPLAT TIB 7 CMNT ROT PLAT STR (Knees) ×1 IMPLANT
CEMENT HV SMART SET (Cement) ×2 IMPLANT
COVER SURGICAL LIGHT HANDLE (MISCELLANEOUS) ×1 IMPLANT
CUFF TOURN SGL QUICK 34 (TOURNIQUET CUFF) ×1
CUFF TRNQT CYL 34X4.125X (TOURNIQUET CUFF) ×1 IMPLANT
DRAPE INCISE IOBAN 66X45 STRL (DRAPES) ×1 IMPLANT
DRAPE U-SHAPE 47X51 STRL (DRAPES) ×1 IMPLANT
DRSG AQUACEL AG ADV 3.5X10 (GAUZE/BANDAGES/DRESSINGS) ×1 IMPLANT
DURAPREP 26ML APPLICATOR (WOUND CARE) ×1 IMPLANT
ELECT REM PT RETURN 15FT ADLT (MISCELLANEOUS) ×1 IMPLANT
GLOVE BIO SURGEON STRL SZ 6.5 (GLOVE) IMPLANT
GLOVE BIO SURGEON STRL SZ7.5 (GLOVE) IMPLANT
GLOVE BIO SURGEON STRL SZ8 (GLOVE) ×1 IMPLANT
GLOVE BIOGEL PI IND STRL 6.5 (GLOVE) IMPLANT
GLOVE BIOGEL PI IND STRL 7.0 (GLOVE) IMPLANT
GLOVE BIOGEL PI IND STRL 8 (GLOVE) ×1 IMPLANT
GOWN STRL REUS W/ TWL LRG LVL3 (GOWN DISPOSABLE) ×1 IMPLANT
GOWN STRL REUS W/ TWL XL LVL3 (GOWN DISPOSABLE) IMPLANT
GOWN STRL REUS W/TWL LRG LVL3 (GOWN DISPOSABLE) ×1
GOWN STRL REUS W/TWL XL LVL3 (GOWN DISPOSABLE)
HANDPIECE INTERPULSE COAX TIP (DISPOSABLE) ×1
HOLDER FOLEY CATH W/STRAP (MISCELLANEOUS) IMPLANT
IMMOBILIZER KNEE 20 (SOFTGOODS) ×1
IMMOBILIZER KNEE 20 THIGH 36 (SOFTGOODS) ×1 IMPLANT
IMMOBILIZER KNEE 22 UNIV (SOFTGOODS) IMPLANT
KIT TURNOVER KIT A (KITS) IMPLANT
MANIFOLD NEPTUNE II (INSTRUMENTS) ×1 IMPLANT
NS IRRIG 1000ML POUR BTL (IV SOLUTION) ×1 IMPLANT
PACK TOTAL KNEE CUSTOM (KITS) ×1 IMPLANT
PADDING CAST ABS COTTON 6X4 NS (CAST SUPPLIES) IMPLANT
PADDING CAST COTTON 6X4 STRL (CAST SUPPLIES) ×2 IMPLANT
PIN STEINMAN FIXATION KNEE (PIN) IMPLANT
PROTECTOR NERVE ULNAR (MISCELLANEOUS) ×1 IMPLANT
SET HNDPC FAN SPRY TIP SCT (DISPOSABLE) ×1 IMPLANT
SPIKE FLUID TRANSFER (MISCELLANEOUS) ×1 IMPLANT
STRIP CLOSURE SKIN 1/2X4 (GAUZE/BANDAGES/DRESSINGS) ×2 IMPLANT
SUT MNCRL AB 4-0 PS2 18 (SUTURE) ×1 IMPLANT
SUT STRATAFIX 0 PDS 27 VIOLET (SUTURE) ×1
SUT VIC AB 2-0 CT1 27 (SUTURE) ×3
SUT VIC AB 2-0 CT1 TAPERPNT 27 (SUTURE) ×3 IMPLANT
SUTURE STRATFX 0 PDS 27 VIOLET (SUTURE) ×1 IMPLANT
TIBIAL BASE ROT PLAT SZ 7 KNEE (Knees) ×1 IMPLANT
TRAY FOLEY MTR SLVR 16FR STAT (SET/KITS/TRAYS/PACK) ×1 IMPLANT
TUBE SUCTION HIGH CAP CLEAR NV (SUCTIONS) ×1 IMPLANT
WATER STERILE IRR 1000ML POUR (IV SOLUTION) ×2 IMPLANT
WRAP KNEE MAXI GEL POST OP (GAUZE/BANDAGES/DRESSINGS) ×1 IMPLANT

## 2022-11-08 NOTE — Anesthesia Postprocedure Evaluation (Signed)
Anesthesia Post Note  Patient: Ronald Dunn  Procedure(s) Performed: TOTAL KNEE ARTHROPLASTY (Left: Knee)     Patient location during evaluation: PACU Anesthesia Type: Spinal Level of consciousness: oriented and awake and alert Pain management: pain level controlled Vital Signs Assessment: post-procedure vital signs reviewed and stable Respiratory status: spontaneous breathing, respiratory function stable and patient connected to nasal cannula oxygen Cardiovascular status: blood pressure returned to baseline and stable Postop Assessment: no headache, no backache and no apparent nausea or vomiting Anesthetic complications: no  No notable events documented.  Last Vitals:  Vitals:   11/08/22 1015 11/08/22 1030  BP: 109/67 107/65  Pulse: (!) 57 (!) 59  Resp: 16 13  Temp: (!) 36.4 C   SpO2: 97% 96%    Last Pain:  Vitals:   11/08/22 1030  TempSrc:   PainSc: 0-No pain                 Ajai Terhaar S

## 2022-11-08 NOTE — Discharge Instructions (Addendum)
 Frank Aluisio, MD Total Joint Specialist [facility] [place] [address] [place], [place] [address] [phone]  TOTAL KNEE REPLACEMENT POSTOPERATIVE DIRECTIONS    Knee Rehabilitation, Guidelines Following Surgery  Results after knee surgery are often greatly improved when you follow the exercise, range of motion and muscle strengthening exercises prescribed by your doctor. Safety measures are also important to protect the knee from further injury. If any of these exercises cause you to have increased pain or swelling in your knee joint, decrease the amount until you are comfortable again and slowly increase them. If you have problems or questions, call your caregiver or physical therapist for advice.   BLOOD CLOT PREVENTION Take a 10 mg Xarelto once a day for three weeks following surgery. Then resume one 81 mg aspirin once a day. You may resume your vitamins/supplements once you have discontinued the Xarelto. Do not take any NSAIDs (Advil, Aleve, Ibuprofen, Meloxicam, etc.) until you have discontinued the Xarelto.   HOME CARE INSTRUCTIONS  Remove items at home which could result in a fall. This includes throw rugs or furniture in walking pathways.  ICE to the affected knee as much as tolerated. Icing helps control swelling. If the swelling is well controlled you will be more comfortable and rehab easier. Continue to use ice on the knee for pain and swelling from surgery. You may notice swelling that will progress down to the foot and ankle. This is normal after surgery. Elevate the leg when you are not up walking on it.    Continue to use the breathing machine which will help keep your temperature down. It is common for your temperature to cycle up and down following surgery, especially at night when you are not up moving around and exerting yourself. The breathing machine keeps your lungs expanded and your temperature down. Do not place pillow under the operative  knee, focus on keeping the knee straight while resting  DIET You may resume your previous home diet once you are discharged from the hospital.  DRESSING / WOUND CARE / SHOWERING Keep your bulky bandage on for 2 days. On the third post-operative day you may remove the Ace bandage and gauze. There is a waterproof adhesive bandage on your skin which will stay in place until your first follow-up appointment. Once you remove this you will not need to place another bandage You may begin showering 3 days following surgery, but do not submerge the incision under water.  ACTIVITY For the first 5 days, the key is rest and control of pain and swelling Do your home exercises twice a day starting on post-operative day 3. On the days you go to physical therapy, just do the home exercises once that day. You should rest, ice and elevate the leg for 50 minutes out of every hour. Get up and walk/stretch for 10 minutes per hour. After 5 days you can increase your activity slowly as tolerated. Walk with your walker as instructed. Use the walker until you are comfortable transitioning to a cane. Walk with the cane in the opposite hand of the operative leg. You may discontinue the cane once you are comfortable and walking steadily. Avoid periods of inactivity such as sitting longer than an hour when not asleep. This helps prevent blood clots.  You may discontinue the knee immobilizer once you are able to perform a straight leg raise while lying down. You may resume a sexual relationship in one month or when given the OK by your   doctor.  You may return to work once you are cleared by your doctor.  Do not drive a car for 6 weeks or until released by your surgeon.  Do not drive while taking narcotics.  TED HOSE STOCKINGS Wear the elastic stockings on both legs for three weeks following surgery during the day. You may remove them at night for sleeping.  WEIGHT BEARING Weight bearing as tolerated with assist device  (walker, cane, etc) as directed, use it as long as suggested by your surgeon or therapist, typically at least 4-6 weeks.  POSTOPERATIVE CONSTIPATION PROTOCOL Constipation - defined medically as fewer than three stools per week and severe constipation as less than one stool per week.  One of the most common issues patients have following surgery is constipation.  Even if you have a regular bowel pattern at home, your normal regimen is likely to be disrupted due to multiple reasons following surgery.  Combination of anesthesia, postoperative narcotics, change in appetite and fluid intake all can affect your bowels.  In order to avoid complications following surgery, here are some recommendations in order to help you during your recovery period.  Colace (docusate) - Pick up an over-the-counter form of Colace or another stool softener and take twice a day as long as you are requiring postoperative pain medications.  Take with a full glass of water daily.  If you experience loose stools or diarrhea, hold the colace until you stool forms back up. If your symptoms do not get better within 1 week or if they get worse, check with your doctor. Dulcolax (bisacodyl) - Pick up over-the-counter and take as directed by the product packaging as needed to assist with the movement of your bowels.  Take with a full glass of water.  Use this product as needed if not relieved by Colace only.  MiraLax (polyethylene glycol) - Pick up over-the-counter to have on hand. MiraLax is a solution that will increase the amount of water in your bowels to assist with bowel movements.  Take as directed and can mix with a glass of water, juice, soda, coffee, or tea. Take if you go more than two days without a movement. Do not use MiraLax more than once per day. Call your doctor if you are still constipated or irregular after using this medication for 7 days in a row.  If you continue to have problems with postoperative constipation, please  contact the office for further assistance and recommendations.  If you experience "the worst abdominal pain ever" or develop nausea or vomiting, please contact the office immediatly for further recommendations for treatment.  ITCHING If you experience itching with your medications, try taking only a single pain pill, or even half a pain pill at a time.  You can also use Benadryl over the counter for itching or also to help with sleep.   MEDICATIONS See your medication summary on the "After Visit Summary" that the nursing staff will review with you prior to discharge.  You may have some home medications which will be placed on hold until you complete the course of blood thinner medication.  It is important for you to complete the blood thinner medication as prescribed by your surgeon.  Continue your approved medications as instructed at time of discharge.  PRECAUTIONS If you experience chest pain or shortness of breath - call 911 immediately for transfer to the hospital emergency department.  If you develop a fever greater that 101 F, purulent drainage from wound, increased redness or   drainage from wound, foul odor from the wound/dressing, or calf pain - CONTACT YOUR SURGEON.                                                   FOLLOW-UP APPOINTMENTS Make sure you keep all of your appointments after your operation with your surgeon and caregivers. You should call the office at the above phone number and make an appointment for approximately two weeks after the date of your surgery or on the date instructed by your surgeon outlined in the "After Visit Summary".  RANGE OF MOTION AND STRENGTHENING EXERCISES  Rehabilitation of the knee is important following a knee injury or an operation. After just a few days of immobilization, the muscles of the thigh which control the knee become weakened and shrink (atrophy). Knee exercises are designed to build up the tone and strength of the thigh muscles and to  improve knee motion. Often times heat used for twenty to thirty minutes before working out will loosen up your tissues and help with improving the range of motion but do not use heat for the first two weeks following surgery. These exercises can be done on a training (exercise) mat, on the floor, on a table or on a bed. Use what ever works the best and is most comfortable for you Knee exercises include:  Leg Lifts - While your knee is still immobilized in a splint or cast, you can do straight leg raises. Lift the leg to 60 degrees, hold for 3 sec, and slowly lower the leg. Repeat 10-20 times 2-3 times daily. Perform this exercise against resistance later as your knee gets better.  Quad and Hamstring Sets - Tighten up the muscle on the front of the thigh (Quad) and hold for 5-10 sec. Repeat this 10-20 times hourly. Hamstring sets are done by pushing the foot backward against an object and holding for 5-10 sec. Repeat as with quad sets.  Leg Slides: Lying on your back, slowly slide your foot toward your buttocks, bending your knee up off the floor (only go as far as is comfortable). Then slowly slide your foot back down until your leg is flat on the floor again. Angel Wings: Lying on your back spread your legs to the side as far apart as you can without causing discomfort.  A rehabilitation program following serious knee injuries can speed recovery and prevent re-injury in the future due to weakened muscles. Contact your doctor or a physical therapist for more information on knee rehabilitation.   POST-OPERATIVE OPIOID TAPER INSTRUCTIONS: It is important to wean off of your opioid medication as soon as possible. If you do not need pain medication after your surgery it is ok to stop day one. Opioids include: Codeine, Hydrocodone(Norco, Vicodin), Oxycodone(Percocet, oxycontin) and hydromorphone amongst others.  Long term and even short term use of opiods can cause: Increased pain  response Dependence Constipation Depression Respiratory depression And more.  Withdrawal symptoms can include Flu like symptoms Nausea, vomiting And more Techniques to manage these symptoms Hydrate well Eat regular healthy meals Stay active Use relaxation techniques(deep breathing, meditating, yoga) Do Not substitute Alcohol to help with tapering If you have been on opioids for less than two weeks and do not have pain than it is ok to stop all together.  Plan to wean off of opioids This plan   should start within one week post op of your joint replacement. Maintain the same interval or time between taking each dose and first decrease the dose.  Cut the total daily intake of opioids by one tablet each day Next start to increase the time between doses. The last dose that should be eliminated is the evening dose.   IF YOU ARE TRANSFERRED TO A SKILLED REHAB FACILITY If the patient is transferred to a skilled rehab facility following release from the hospital, a list of the current medications will be sent to the facility for the patient to continue.  When discharged from the skilled rehab facility, please have the facility set up the patient's Home Health Physical Therapy prior to being released. Also, the skilled facility will be responsible for providing the patient with their medications at time of release from the facility to include their pain medication, the muscle relaxants, and their blood thinner medication. If the patient is still at the rehab facility at time of the two week follow up appointment, the skilled rehab facility will also need to assist the patient in arranging follow up appointment in our office and any transportation needs.  MAKE SURE YOU:  Understand these instructions.  Get help right away if you are not doing well or get worse.   DENTAL ANTIBIOTICS:  In most cases prophylactic antibiotics for Dental procdeures after total joint surgery are not  necessary.  Exceptions are as follows:  1. History of prior total joint infection  2. Severely immunocompromised (Organ Transplant, cancer chemotherapy, Rheumatoid biologic meds such as Humera)  3. Poorly controlled diabetes (A1C &gt; 8.0, blood glucose over 200)  If you have one of these conditions, contact your surgeon for an antibiotic prescription, prior to your dental procedure.    Pick up stool softner and laxative for home use following surgery while on pain medications. Do not submerge incision under water. Please use good hand washing techniques while changing dressing each day. May shower starting three days after surgery. Please use a clean towel to pat the incision dry following showers. Continue to use ice for pain and swelling after surgery. Do not use any lotions or creams on the incision until instructed by your surgeon.  Information on my medicine - XARELTO (Rivaroxaban)  Why was Xarelto prescribed for you? Xarelto was prescribed for you to reduce the risk of blood clots forming after orthopedic surgery. The medical term for these abnormal blood clots is venous thromboembolism (VTE).  What do you need to know about xarelto ? Take your Xarelto ONCE DAILY at the same time every day. You may take it either with or without food.  If you have difficulty swallowing the tablet whole, you may crush it and mix in applesauce just prior to taking your dose.  Take Xarelto exactly as prescribed by your doctor and DO NOT stop taking Xarelto without talking to the doctor who prescribed the medication.  Stopping without other VTE prevention medication to take the place of Xarelto may increase your risk of developing a clot.  After discharge, you should have regular check-up appointments with your healthcare provider that is prescribing your Xarelto.    What do you do if you miss a dose? If you miss a dose, take it as soon as you remember on the same day then continue  your regularly scheduled once daily regimen the next day. Do not take two doses of Xarelto on the same day.   Important Safety Information A possible side effect   of Xarelto is bleeding. You should call your healthcare provider right away if you experience any of the following: Bleeding from an injury or your nose that does not stop. Unusual colored urine (red or dark brown) or unusual colored stools (red or black). Unusual bruising for unknown reasons. A serious fall or if you hit your head (even if there is no bleeding).  Some medicines may interact with Xarelto and might increase your risk of bleeding while on Xarelto. To help avoid this, consult your healthcare provider or pharmacist prior to using any new prescription or non-prescription medications, including herbals, vitamins, non-steroidal anti-inflammatory drugs (NSAIDs) and supplements.  This website has more information on Xarelto: www.xarelto.com.   

## 2022-11-08 NOTE — Evaluation (Signed)
Physical Therapy Evaluation Patient Details Name: Ronald Dunn MRN: [id number] DOB: [birth year] Today's Date: 11/08/2022  History of Present Illness  89 yo male presents to therapy s/p L TKA on 11/08/2022 due to failure of conservative measures. Pt PMH includes but is not limited to: DVT, ischemic heart dz, RBBB, R TKA (2020), CAD s/p CABG, CKD, gout, A-fib, HTN, HDL and R THA (2022).  Clinical Impression    Ronald Dunn is a 89 y.o. male POD 0 s/p L TKA. Patient reports mod I with mobility at baseline. Patient is now limited by functional impairments (see PT problem list below) and requires min guard with HOB elevated  for bed mobility and min guard and cues for transfers. Patient was able to ambulate 38 feet with RW and min guard level of assist. Patient instructed in exercise to facilitate ROM and circulation to manage edema. Pt left seated in recliner all needs met, family present and nurse aware of pt increased pain. PT initally attempted intervention at 1430 and pt reported 3/10 pain L knee and nurse administered pain medication. Nurse made PT aware pt later requested additional pain medication and again during PT session. Pt reported pain 7/10. Patient will benefit from continued skilled PT interventions to address impairments and progress towards PLOF. Acute PT will follow to progress mobility and stair training in preparation for safe discharge home with family support and pt reports OPPT.      Recommendations for follow up therapy are one component of a multi-disciplinary discharge planning process, led by the attending physician.  Recommendations may be updated based on patient status, additional functional criteria and insurance authorization.  Follow Up Recommendations       Assistance Recommended at Discharge Intermittent Supervision/Assistance  Patient can return home with the following  A little help with walking and/or transfers;A little help with  bathing/dressing/bathroom;Assistance with cooking/housework;Assist for transportation;Help with stairs or ramp for entrance    Equipment Recommendations Rolling walker (2 wheels) (pt reports if he is eligable for a new RW it would be good his is not locking properly)  Recommendations for Other Services       Functional Status Assessment Patient has had a recent decline in their functional status and demonstrates the ability to make significant improvements in function in a reasonable and predictable amount of time.     Precautions / Restrictions Precautions Precautions: Knee;Fall Restrictions Weight Bearing Restrictions: No      Mobility  Bed Mobility Overal bed mobility: Needs Assistance Bed Mobility: Supine to Sit     Supine to sit: Min guard, HOB elevated     General bed mobility comments: min cues    Transfers Overall transfer level: Needs assistance Equipment used: Rolling walker (2 wheels) Transfers: Sit to/from Stand Sit to Stand: Min guard           General transfer comment: cues for proper UE and AD placement    Ambulation/Gait Ambulation/Gait assistance: Min guard Gait Distance (Feet): 38 Feet Assistive device: Rolling walker (2 wheels) Gait Pattern/deviations: Decreased step length - right, Antalgic (step almost to pattern) Gait velocity: decreased     General Gait Details: B UE support at RW to offload LLE in stance phase  Stairs            Wheelchair Mobility    Modified Rankin (Stroke Patients Only)       Balance Overall balance assessment: Needs assistance Sitting-balance support: Feet supported Sitting balance-Leahy Scale: Fair     Standing balance support:  Bilateral upper extremity supported, During functional activity, Reliant on assistive device for balance Standing balance-Leahy Scale: Poor                               Pertinent Vitals/Pain Pain Assessment Pain Assessment: 0-10 Pain Score: 7  Pain  Location: L knee (pattella anterior knee) Pain Descriptors / Indicators: Aching, Constant, Operative site guarding Pain Intervention(s): Limited activity within patient's tolerance, Monitored during session, Premedicated before session, Patient requesting pain meds-RN notified, Ice applied (pain at 1430 3/10 and pt provided with pain medication and with mobility ~ 45 mins later pain 7/10)    Home Living Family/patient expects to be discharged to:: Private residence Living Arrangements: Spouse/significant other; pt reports wife has dementia Available Help at Discharge: Family Type of Home: House Home Access: Stairs to enter Entrance Stairs-Rails: None Entrance Stairs-Number of Steps: 1   Home Layout: Able to live on main level with bedroom/bathroom;Two level Home Equipment: Agricultural consultant (2 wheels)      Prior Function Prior Level of Function : Independent/Modified Independent             Mobility Comments: mod I with RW inside home and SPC in community mod I with all ADLs, self care tasks and IADLs       Hand Dominance        Extremity/Trunk Assessment        Lower Extremity Assessment Lower Extremity Assessment: LLE deficits/detail LLE Deficits / Details: ankle DF/PF 5/5; SLR < 10 degree lag LLE Sensation: WNL    Cervical / Trunk Assessment Cervical / Trunk Assessment: Normal  Communication   Communication: No difficulties  Cognition Arousal/Alertness: Awake/alert Behavior During Therapy: WFL for tasks assessed/performed Overall Cognitive Status: Within Functional Limits for tasks assessed                                          General Comments      Exercises Total Joint Exercises Ankle Circles/Pumps: AROM, Both, 20 reps   Assessment/Plan    PT Assessment Patient needs continued PT services  PT Problem List Decreased strength;Decreased range of motion;Decreased activity tolerance;Decreased balance;Decreased mobility;Decreased  coordination;Other (comment)       PT Treatment Interventions DME instruction;Gait training;Stair training;Functional mobility training;Therapeutic activities;Therapeutic exercise;Balance training;Neuromuscular re-education;Patient/family education;Modalities    PT Goals (Current goals can be found in the Care Plan section)  Acute Rehab PT Goals Patient Stated Goal: to be able to keep up with wife and great grandchildre PT Goal Formulation: With patient Time For Goal Achievement: 11/22/22 Potential to Achieve Goals: Good    Frequency 7X/week     Co-evaluation               AM-PAC PT "6 Clicks" Mobility  Outcome Measure Help needed turning from your back to your side while in a flat bed without using bedrails?: A Little Help needed moving from lying on your back to sitting on the side of a flat bed without using bedrails?: A Little Help needed moving to and from a bed to a chair (including a wheelchair)?: A Little Help needed standing up from a chair using your arms (e.g., wheelchair or bedside chair)?: A Little Help needed to walk in hospital room?: A Little Help needed climbing 3-5 steps with a railing? : A Lot 6 Click Score: 17  End of Session Equipment Utilized During Treatment: Gait belt Activity Tolerance: Patient limited by pain Patient left: in chair;with call bell/phone within reach;with family/visitor present Nurse Communication: Mobility status PT Visit Diagnosis: Unsteadiness on feet (R26.81);Other abnormalities of gait and mobility (R26.89);Muscle weakness (generalized) (M62.81);Difficulty in walking, not elsewhere classified (R26.2);Pain Pain - Right/Left: Left Pain - part of body: Knee    Time: 3154-0086 PT Time Calculation (min) (ACUTE ONLY): 31 min   Charges:   PT Evaluation $PT Eval Low Complexity: 1 Low PT Treatments $Gait Training: 8-22 mins        Rica Mote, PT   Jacqualyn Posey 11/08/2022, 3:51 PM

## 2022-11-08 NOTE — Op Note (Signed)
OPERATIVE REPORT-TOTAL KNEE ARTHROPLASTY   Pre-operative diagnosis- Osteoarthritis  Left knee(s)  Post-operative diagnosis- Osteoarthritis Left knee(s)  Procedure-  Left  Total Knee Arthroplasty  Surgeon- Ronald Rankin. Careena Degraffenreid, MD  Assistant- Arther Abbott, PA-C   Anesthesia-   Adductor canal block and spinal  EBL-50 mL   Drains None  Tourniquet time-  Total Tourniquet Time Documented: Thigh (Left) - 34 minutes Total: Thigh (Left) - 34 minutes     Complications- None  Condition-PACU - hemodynamically stable.   Brief Clinical Note   Ronald Dunn is a 89 y.o. year old male with end stage OA of his left knee with progressively worsening pain and dysfunction. He has constant pain, with activity and at rest and significant functional deficits with difficulties even with ADLs. He has had extensive non-op management including analgesics, injections of cortisone and viscosupplements, and home exercise program, but remains in significant pain with significant dysfunction. Radiographs show bone on bone arthritis all 3 compartments. He presents now for left Total Knee Arthroplasty.     Procedure in detail---   The patient is brought into the operating room and positioned supine on the operating table. After successful administration of  Adductor canal block and spinal,   a tourniquet is placed high on the  Left thigh(s) and the lower extremity is prepped and draped in the usual sterile fashion. Time out is performed by the operating team and then the  Left lower extremity is wrapped in Esmarch, knee flexed and the tourniquet inflated to 300 mmHg.       A midline incision is made with a ten blade through the subcutaneous tissue to the level of the extensor mechanism. A fresh blade is used to make a medial parapatellar arthrotomy. Soft tissue over the proximal medial tibia is subperiosteally elevated to the joint line with a knife and into the semimembranosus bursa with a Cobb elevator. Soft  tissue over the proximal lateral tibia is elevated with attention being paid to avoiding the patellar tendon on the tibial tubercle. The patella is everted, knee flexed 90 degrees and the ACL and PCL are removed. Findings are bone on bone all 3 compartments with large global osteophytes        The drill is used to create a starting hole in the distal femur and the canal is thoroughly irrigated with sterile saline to remove the fatty contents. The 5 degree Left  valgus alignment guide is placed into the femoral canal and the distal femoral cutting block is pinned to remove 9 mm off the distal femur. Resection is made with an oscillating saw.      The tibia is subluxed forward and the menisci are removed. The extramedullary alignment guide is placed referencing proximally at the medial aspect of the tibial tubercle and distally along the second metatarsal axis and tibial crest. The block is pinned to remove 79mm off the more deficient lateral  side. Resection is made with an oscillating saw. Size 7is the most appropriate size for the tibia and the proximal tibia is prepared with the modular drill and keel punch for that size.      The femoral sizing guide is placed and size 6 is most appropriate. Rotation is marked off the epicondylar axis and confirmed by creating a rectangular flexion gap at 90 degrees. The size 6 cutting block is pinned in this rotation and the anterior, posterior and chamfer cuts are made with the oscillating saw. The intercondylar block is then placed and that cut  is made.      Trial size 7 tibial component, trial size 6 posterior stabilized femur and a 12  mm posterior stabilized rotating platform insert trial is placed. Full extension is achieved with excellent varus/valgus and anterior/posterior balance throughout full range of motion. The patella is everted and thickness measured to be 27  mm. Free hand resection is taken to 15 mm, a 41 template is placed, lug holes are drilled, trial  patella is placed, and it tracks normally. Osteophytes are removed off the posterior femur with the trial in place. All trials are removed and the cut bone surfaces prepared with pulsatile lavage. Cement is mixed and once ready for implantation, the size 7 tibial implant, size  6 posterior stabilized femoral component, and the size 41 patella are cemented in place and the patella is held with the clamp. The trial insert is placed and the knee held in full extension. The Exparel (20 ml mixed with 60 ml saline) is injected into the extensor mechanism, posterior capsule, medial and lateral gutters and subcutaneous tissues.  All extruded cement is removed and once the cement is hard the permanent 12 mm posterior stabilized rotating platform insert is placed into the tibial tray.      The wound is copiously irrigated with saline solution and the extensor mechanism closed with # 0 Stratofix suture. The tourniquet is released for a total tourniquet time of 34  minutes. Flexion against gravity is 140 degrees and the patella tracks normally. Subcutaneous tissue is closed with 2.0 vicryl and subcuticular with running 4.0 Monocryl. The incision is cleaned and dried and steri-strips and a bulky sterile dressing are applied. The limb is placed into a knee immobilizer and the patient is awakened and transported to recovery in stable condition.      Please note that a surgical assistant was a medical necessity for this procedure in order to perform it in a safe and expeditious manner. Surgical assistant was necessary to retract the ligaments and vital neurovascular structures to prevent injury to them and also necessary for proper positioning of the limb to allow for anatomic placement of the prosthesis.   Ronald Rankin [name], MD    11/08/2022, 9:17 AM

## 2022-11-08 NOTE — Anesthesia Procedure Notes (Signed)
Anesthesia Regional Block: Adductor canal block   Pre-Anesthetic Checklist: , timeout performed,  Correct Patient, Correct Site, Correct Laterality,  Correct Procedure, Correct Position, site marked,  Risks and benefits discussed,  Surgical consent,  Pre-op evaluation,  At surgeon's request and post-op pain management  Laterality: Left  Prep: chloraprep       Needles:  Injection technique: Single-shot  Needle Type: Echogenic Needle     Needle Length: 9cm      Additional Needles:   Procedures:,,,, ultrasound used (permanent image in chart),,    Narrative:  Start time: 11/08/2022 7:24 AM End time: 11/08/2022 7:30 AM Injection made incrementally with aspirations every 5 mL.  Performed by: Personally  Anesthesiologist: Eilene Ghazi, MD  Additional Notes: Patient tolerated the procedure well without complications

## 2022-11-08 NOTE — Interval H&P Note (Signed)
History and Physical Interval Note:  11/08/2022 6:35 AM  Ronald Dunn  has presented today for surgery, with the diagnosis of left knee osteoarthritis.  The various methods of treatment have been discussed with the patient and family. After consideration of risks, benefits and other options for treatment, the patient has consented to  Procedure(s): TOTAL KNEE ARTHROPLASTY (Left) as a surgical intervention.  The patient's history has been reviewed, patient examined, no change in status, stable for surgery.  I have reviewed the patient's chart and labs.  Questions were answered to the patient's satisfaction.     Homero Fellers Ilithyia Titzer

## 2022-11-08 NOTE — Anesthesia Procedure Notes (Signed)
Procedure Name: MAC Date/Time: 11/08/2022 8:05 AM  Performed by: Nelle Don, CRNAPre-anesthesia Checklist: Patient identified, Emergency Drugs available, Suction available and Patient being monitored Oxygen Delivery Method: Simple face mask

## 2022-11-08 NOTE — Care Plan (Signed)
Ortho Bundle Case Management Note  Patient Details  Name: Ronald Dunn MRN: [id number] Date of Birth: 25-May-1932  L TKA on 11-08-22 DCP:  Home with caregiver during the day and dtr at night DME:  No needs, has a RW PT:  [facility] on 11-11-22                   DME Arranged:  N/A DME Agency:  NA  HH Arranged:  NA HH Agency:  NA  Additional Comments: Please contact me with any questions of if this plan should need to change.  Ennis Forts, RN,CCM [facility]  [phone] 11/08/2022, 10:26 AM

## 2022-11-08 NOTE — Progress Notes (Signed)
Orthopedic Tech Progress Note Patient Details:  Ronald Dunn 1931/12/17 [id number]  CPM Left Knee CPM Left Knee: Off Left Knee Flexion (Degrees): 40 Left Knee Extension (Degrees): 10  Post Interventions Patient Tolerated: Well  [name] 11/08/2022, 1:52 PM

## 2022-11-08 NOTE — Anesthesia Procedure Notes (Signed)
Spinal  Patient location during procedure: OR Start time: 11/08/2022 8:06 AM End time: 11/08/2022 8:10 AM Reason for block: surgical anesthesia Staffing Performed: anesthesiologist  Anesthesiologist: Eilene Ghazi, MD Performed by: Eilene Ghazi, MD Authorized by: Eilene Ghazi, MD   Preanesthetic Checklist Completed: patient identified, IV checked, site marked, risks and benefits discussed, surgical consent, monitors and equipment checked, pre-op evaluation and timeout performed Spinal Block Patient position: sitting Prep: Betadine Patient monitoring: heart rate, continuous pulse ox and blood pressure Approach: midline Injection technique: single-shot Needle Needle type: Sprotte  Needle gauge: 24 G Needle length: 9 cm Assessment Sensory level: T6 Events: CSF return Additional Notes

## 2022-11-08 NOTE — Progress Notes (Signed)
Orthopedic Tech Progress Note Patient Details:  MAYLAND TREXLER 11/15/1931 [id number]  CPM Left Knee CPM Left Knee: On Left Knee Flexion (Degrees): 40 Left Knee Extension (Degrees): 10  Post Interventions Patient Tolerated: Well  [name] 11/08/2022, 10:04 AM

## 2022-11-08 NOTE — Transfer of Care (Signed)
Immediate Anesthesia Transfer of Care Note  Patient: Ronald Dunn  Procedure(s) Performed: TOTAL KNEE ARTHROPLASTY (Left: Knee)  Patient Location: PACU  Anesthesia Type:Spinal  Level of Consciousness: awake, alert , and oriented  Airway & Oxygen Therapy: Patient Spontanous Breathing and Patient connected to face mask oxygen  Post-op Assessment: Report given to RN and Post -op Vital signs reviewed and stable  Post vital signs: Reviewed and stable  Last Vitals:  Vitals Value Taken Time  BP 98/57   Temp    Pulse 55   Resp 9 11/08/22 0937  SpO2 100   Vitals shown include unvalidated device data.  Last Pain:  Vitals:   11/08/22 0607  TempSrc:   PainSc: 0-No pain      Patients Stated Pain Goal: 4 (11/08/22 8828)  Complications: No notable events documented.

## 2022-11-08 NOTE — Anesthesia Procedure Notes (Signed)
Anesthesia Procedure Image    

## 2022-11-09 DIAGNOSIS — N189 Chronic kidney disease, unspecified: Secondary | ICD-10-CM | POA: Diagnosis not present

## 2022-11-09 DIAGNOSIS — Z86718 Personal history of other venous thrombosis and embolism: Secondary | ICD-10-CM | POA: Diagnosis not present

## 2022-11-09 DIAGNOSIS — I129 Hypertensive chronic kidney disease with stage 1 through stage 4 chronic kidney disease, or unspecified chronic kidney disease: Secondary | ICD-10-CM | POA: Diagnosis not present

## 2022-11-09 DIAGNOSIS — I480 Paroxysmal atrial fibrillation: Secondary | ICD-10-CM | POA: Diagnosis not present

## 2022-11-09 DIAGNOSIS — M1712 Unilateral primary osteoarthritis, left knee: Secondary | ICD-10-CM | POA: Diagnosis not present

## 2022-11-09 DIAGNOSIS — I2510 Atherosclerotic heart disease of native coronary artery without angina pectoris: Secondary | ICD-10-CM | POA: Diagnosis not present

## 2022-11-09 LAB — BASIC METABOLIC PANEL
Anion gap: 6 (ref 5–15)
BUN: 26 mg/dL — ABNORMAL HIGH (ref 8–23)
CO2: 24 mmol/L (ref 22–32)
Calcium: 8.3 mg/dL — ABNORMAL LOW (ref 8.9–10.3)
Chloride: 106 mmol/L (ref 98–111)
Creatinine, Ser: 1.02 mg/dL (ref 0.61–1.24)
GFR, Estimated: >60 mL/min (ref >60)
Glucose, Bld: 126 mg/dL — ABNORMAL HIGH (ref 70–99)
Potassium: 4.2 mmol/L (ref 3.5–5.1)
Sodium: 136 mmol/L (ref 135–145)

## 2022-11-09 LAB — CBC
HCT: 31.4 % — ABNORMAL LOW (ref 39.0–52.0)
Hemoglobin: 10.2 g/dL — ABNORMAL LOW (ref 13.0–17.0)
MCH: 30.9 pg (ref 26.0–34.0)
MCHC: 32.5 g/dL (ref 30.0–36.0)
MCV: 95.2 fL (ref 80.0–100.0)
Platelets: 130 10*3/uL — ABNORMAL LOW (ref 150–400)
RBC: 3.3 MIL/uL — ABNORMAL LOW (ref 4.22–5.81)
RDW: 14.5 % (ref 11.5–15.5)
WBC: 11.9 10*3/uL — ABNORMAL HIGH (ref 4.0–10.5)
nRBC: 0 % (ref 0.0–0.2)

## 2022-11-09 MED ORDER — RIVAROXABAN 10 MG PO TABS: 10.0000 mg | ORAL_TABLET | Freq: Every day | ORAL | 0 refills | Status: AC

## 2022-11-09 MED ORDER — OXYCODONE HCL 5 MG PO TABS: 5.0000 mg | ORAL_TABLET | Freq: Three times a day (TID) | ORAL | 0 refills | Status: DC | PRN

## 2022-11-09 MED ORDER — METHOCARBAMOL 500 MG PO TABS: 500.0000 mg | ORAL_TABLET | Freq: Four times a day (QID) | ORAL | 0 refills | Status: DC | PRN

## 2022-11-09 MED ORDER — TRAMADOL HCL 50 MG PO TABS: 50.0000 mg | ORAL_TABLET | Freq: Four times a day (QID) | ORAL | 0 refills | Status: DC | PRN

## 2022-11-09 NOTE — TOC Transition Note (Signed)
Transition of Care [facility]) - CM/SW Discharge Note  Patient Details  Name: Ronald Dunn MRN: [id number] Date of Birth: 04/13/1932  [facility] CM/SW Contact:  Ewing Schlein, LCSW Phone Number: 11/09/2022, 10:22 AM  Clinical Narrative: Patient is expected to discharge home after working with PT. CSW met with patient to review discharge plan. Patient will go home with OPPT at [facility]. Patient has a rolling walker at home, so there are no DME needs at this time. TOC signing off.    Final next level of care: OP Rehab Barriers to Discharge: No Barriers Identified  Patient Goals and CMS Choice Choice offered to / list presented to : NA  Discharge Plan and Services Additional resources added to the After Visit Summary for          DME Arranged: N/A DME Agency: NA HH Arranged: NA HH Agency: NA  Social Determinants of Health (SDOH) Interventions SDOH Screenings   Food Insecurity: No Food Insecurity (11/08/2022)  Housing: Low Risk  (11/08/2022)  Transportation Needs: No Transportation Needs (11/08/2022)  Utilities: Not At Risk (11/08/2022)  Tobacco Use: Low Risk  (11/08/2022)   Readmission Risk Interventions     No data to display

## 2022-11-09 NOTE — Progress Notes (Addendum)
Physical Therapy Treatment Patient Details Name: Ronald Dunn MRN: [id number] DOB: 1932-05-09 Today's Date: 11/09/2022   History of Present Illness 89 yo male presents to therapy s/p L TKA on 11/08/2022 due to failure of conservative measures. Pt PMH includes but is not limited to: DVT, ischemic heart dz, RBBB, R TKA (2020), CAD s/p CABG, CKD, gout, A-fib, HTN, HDL and R THA (2022).    PT Comments    POD #1 pm session Assisted pt to transfer from recliner to ambulate in hallway. Family member present to be "hands on" and educate. Assisted pt to ambulate over 1 step, Vcs to lock out L knee prior to stepping up. Assisted pt to transfer back to recliner and complete seated exercises. Addressed all mobility questions, discussed appropriate activity, educated on use of ICE.  Pt ready for D/C to home.   Recommendations for follow up therapy are one component of a multi-disciplinary discharge planning process, led by the attending physician.  Recommendations may be updated based on patient status, additional functional criteria and insurance authorization.  Follow Up Recommendations       Assistance Recommended at Discharge Intermittent Supervision/Assistance  Patient can return home with the following A little help with walking and/or transfers;A little help with bathing/dressing/bathroom;Assistance with cooking/housework;Assist for transportation;Help with stairs or ramp for entrance   Equipment Recommendations  Rolling walker (2 wheels)    Recommendations for Other Services       Precautions / Restrictions Precautions Precautions: Knee;Fall Restrictions Weight Bearing Restrictions: No     Mobility  Bed Mobility                    Transfers Overall transfer level: Needs assistance Equipment used: Rolling walker (2 wheels) Transfers: Sit to/from Stand Sit to Stand: Min guard                Ambulation/Gait Ambulation/Gait assistance: Min guard Gait Distance  (Feet): 40 Feet Assistive device: Rolling walker (2 wheels) Gait Pattern/deviations: Decreased step length - right, Antalgic, Step-to pattern Gait velocity: decreased     General Gait Details: B UE support at RW to offload LLE in stance phase, VCs to encourage heel strike   Stairs Stairs: Yes Stairs assistance: Min assist Stair Management: No rails Number of Stairs: 1 General stair comments: Leaning to the L during up step   Wheelchair Mobility    Modified Rankin (Stroke Patients Only)       Balance                                            Cognition Arousal/Alertness: Awake/alert Behavior During Therapy: WFL for tasks assessed/performed Overall Cognitive Status: Within Functional Limits for tasks assessed                                          Exercises Total Joint Exercises Ankle Circles/Pumps: AROM, Both, 20 reps Quad Sets: AAROM, Left, 5 reps, Seated Towel Squeeze: AROM, Both, 5 reps, Seated Short Arc Quad: AROM, Left, 5 reps, Seated Heel Slides: AAROM, Left, 5 reps, Seated Hip ABduction/ADduction: AROM, Left, 5 reps, Seated Straight Leg Raises: AAROM, Left, 5 reps, Seated Long Arc Quad: AAROM, Left, 5 reps, Seated Knee Flexion: AROM, AAROM, Left, 5 reps, Seated    General Comments  Pertinent Vitals/Pain Pain Assessment Pain Score: 8  Pain Location: L knee Pain Descriptors / Indicators: Aching, Constant, Operative site guarding Pain Intervention(s): Monitored during session, Repositioned, Ice applied    Home Living                          Prior Function            PT Goals (current goals can now be found in the care plan section) Acute Rehab PT Goals Patient Stated Goal: to be able to keep up with wife and great grandchildre PT Goal Formulation: With patient Time For Goal Achievement: 11/22/22 Potential to Achieve Goals: Good Progress towards PT goals: Progressing toward goals     Frequency    7X/week      PT Plan      Co-evaluation              AM-PAC PT "6 Clicks" Mobility   Outcome Measure  Help needed turning from your back to your side while in a flat bed without using bedrails?: A Little Help needed moving from lying on your back to sitting on the side of a flat bed without using bedrails?: A Little Help needed moving to and from a bed to a chair (including a wheelchair)?: A Little Help needed standing up from a chair using your arms (e.g., wheelchair or bedside chair)?: A Little Help needed to walk in hospital room?: A Little Help needed climbing 3-5 steps with a railing? : A Lot 6 Click Score: 17    End of Session Equipment Utilized During Treatment: Gait belt Activity Tolerance: Patient tolerated treatment well Patient left: in chair;with call bell/phone within reach;with chair alarm set;with family/visitor present Nurse Communication: Mobility status PT Visit Diagnosis: Unsteadiness on feet (R26.81);Other abnormalities of gait and mobility (R26.89);Muscle weakness (generalized) (M62.81);Difficulty in walking, not elsewhere classified (R26.2);Pain Pain - Right/Left: Left Pain - part of body: Knee     Time: 7342-8768 PT Time Calculation (min) (ACUTE ONLY): 39 min  Charges:  $Gait Training: 8-22 mins $Therapeutic Exercise: 8-22 mins                     Sharlene Motts, [place]

## 2022-11-09 NOTE — Progress Notes (Signed)
Physical Therapy Treatment Patient Details Name: Ronald Dunn MRN: [id number] DOB: November 20, 1931 Today's Date: 11/09/2022   History of Present Illness 89 yo male presents to therapy s/p L TKA on 11/08/2022 due to failure of conservative measures. Pt PMH includes but is not limited to: DVT, ischemic heart dz, RBBB, R TKA (2020), CAD s/p CABG, CKD, gout, A-fib, HTN, HDL and R THA (2022).    PT Comments    POD #1 am session Assisted pt to transfer from recliner to ambulate in hallway. Assisted pt to ambulate over 1 stair, no railings. Pt leaning heavily on UE and resistant to putting weight on L LE. Assisted pt to transfer back to recliner. Completed supine exercises in recliner, educated on seated exercises. Will practice stairs and seated exercises with family in pm session. Addressed all mobility questions, discussed appropriate activity, educated on use of ICE.     Recommendations for follow up therapy are one component of a multi-disciplinary discharge planning process, led by the attending physician.  Recommendations may be updated based on patient status, additional functional criteria and insurance authorization.  Follow Up Recommendations       Assistance Recommended at Discharge Intermittent Supervision/Assistance  Patient can return home with the following A little help with walking and/or transfers;A little help with bathing/dressing/bathroom;Assistance with cooking/housework;Assist for transportation;Help with stairs or ramp for entrance   Equipment Recommendations  Rolling walker (2 wheels)    Recommendations for Other Services       Precautions / Restrictions Precautions Precautions: Knee;Fall Restrictions Weight Bearing Restrictions: No     Mobility  Bed Mobility                    Transfers Overall transfer level: Needs assistance Equipment used: Rolling walker (2 wheels) Transfers: Sit to/from Stand Sit to Stand: Min guard                 Ambulation/Gait Ambulation/Gait assistance: Min guard Gait Distance (Feet): 40 Feet Assistive device: Rolling walker (2 wheels) Gait Pattern/deviations: Decreased step length - right, Antalgic, Step-to pattern Gait velocity: decreased     General Gait Details: B UE support at RW to offload LLE in stance phase   Stairs Stairs: Yes Stairs assistance: Min assist Stair Management: No rails Number of Stairs: 1 General stair comments: Leaning to the L during up step   Wheelchair Mobility    Modified Rankin (Stroke Patients Only)       Balance                                            Cognition Arousal/Alertness: Awake/alert Behavior During Therapy: WFL for tasks assessed/performed Overall Cognitive Status: Within Functional Limits for tasks assessed                                          Exercises Total Joint Exercises Ankle Circles/Pumps: AROM, Both, 20 reps Quad Sets: AAROM, Left, 5 reps, Seated Towel Squeeze: AROM, Both, 5 reps, Seated Short Arc Quad: AROM, Left, 5 reps, Seated Heel Slides: AAROM, Left, 5 reps, Seated Hip ABduction/ADduction: AROM, Left, 5 reps, Seated Straight Leg Raises: AAROM, Left, 5 reps, Seated    General Comments        Pertinent Vitals/Pain Pain Assessment Pain Score: 8  Pain Location: L knee Pain Descriptors / Indicators: Aching, Constant, Operative site guarding Pain Intervention(s): Monitored during session, Ice applied, Repositioned    Home Living                          Prior Function            PT Goals (current goals can now be found in the care plan section) Acute Rehab PT Goals Patient Stated Goal: to be able to keep up with wife and great grandchildre PT Goal Formulation: With patient Time For Goal Achievement: 11/22/22 Potential to Achieve Goals: Good Progress towards PT goals: Progressing toward goals    Frequency    7X/week      PT Plan       Co-evaluation              AM-PAC PT "6 Clicks" Mobility   Outcome Measure  Help needed turning from your back to your side while in a flat bed without using bedrails?: A Little Help needed moving from lying on your back to sitting on the side of a flat bed without using bedrails?: A Little Help needed moving to and from a bed to a chair (including a wheelchair)?: A Little Help needed standing up from a chair using your arms (e.g., wheelchair or bedside chair)?: A Little Help needed to walk in hospital room?: A Little Help needed climbing 3-5 steps with a railing? : A Lot 6 Click Score: 17    End of Session Equipment Utilized During Treatment: Gait belt Activity Tolerance: Patient tolerated treatment well Patient left: in chair;with call bell/phone within reach;with chair alarm set Nurse Communication: Mobility status PT Visit Diagnosis: Unsteadiness on feet (R26.81);Other abnormalities of gait and mobility (R26.89);Muscle weakness (generalized) (M62.81);Difficulty in walking, not elsewhere classified (R26.2);Pain Pain - Right/Left: Left Pain - part of body: Knee     Time: 1761-6073 PT Time Calculation (min) (ACUTE ONLY): 39 min  Charges:  $Gait Training: 8-22 mins $Therapeutic Exercise: 8-22 mins $Therapeutic Activity: 8-22 mins                     Sharlene Motts, SPTA

## 2022-11-09 NOTE — Progress Notes (Signed)
Subjective: 1 Day Post-Op Procedure(s) (LRB): TOTAL KNEE ARTHROPLASTY (Left) Patient reports pain as mild.   Patient seen in rounds by Dr. Lequita Halt. Patient is well, and has had no acute complaints or problems No issues overnight. Denies chest pain, SOB, or calf pain. Foley catheter removed this AM.  We will continue therapy today, ambulated 40' yesterday.   Objective: Vital signs in last 24 hours: Temp:  [97.1 F (36.2 C)-97.6 F (36.4 C)] 97.5 F (36.4 C) (04/16 0541) Pulse Rate:  [54-70] 64 (04/16 0541) Resp:  [10-18] 17 (04/16 0541) BP: (98-129)/(57-77) 98/61 (04/16 0541) SpO2:  [92 %-100 %] 100 % (04/16 0541)  Intake/Output from previous day:  Intake/Output Summary (Last 24 hours) at 11/09/2022 0736 Last data filed at 11/09/2022 0600 Gross per 24 hour  Intake 3305.88 ml  Output 1525 ml  Net 1780.88 ml     Intake/Output this shift: No intake/output data recorded.  Labs: Recent Labs    11/09/22 0335  HGB 10.2*   Recent Labs    11/09/22 0335  WBC 11.9*  RBC 3.30*  HCT 31.4*  PLT 130*   Recent Labs    11/09/22 0335  NA 136  K 4.2  CL 106  CO2 24  BUN 26*  CREATININE 1.02  GLUCOSE 126*  CALCIUM 8.3*   No results for input(s): "LABPT", "INR" in the last 72 hours.  Exam: General - Patient is Alert and Oriented Extremity - Neurologically intact Neurovascular intact Sensation intact distally Dorsiflexion/Plantar flexion intact Dressing - dressing C/D/I Motor Function - intact, moving foot and toes well on exam.   Past Medical History:  Diagnosis Date   Abnormal stress echocardiogram 08/16/2015   Overview:  With post exercise LV dilation   Arthritis 04/14/2017   Borderline anemia 05/26/2018   pt denies   Chronic ischemic heart disease    CKD (chronic kidney disease) 04/14/2017   Coronary artery disease involving native coronary artery of native heart 04/14/2017   Coronary artery disease with stable angina pectoris 09/01/2015   pt denies  chest pain   DVT (deep venous thrombosis)    Right leg   Dyspnea 2017   pt denies on proep of 10/27/22   Essential hypertension 07/22/2015   off of bl/p meds since 10/21/22 per DR Redding   Gout 04/14/2017   Hyperlipidemia 07/22/2015   Incomplete right bundle branch block (RBBB)    Noted on EKG 01/31/2018   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   Leukocytosis 04/14/2017   Nocturia    Obesity 04/14/2017   On amiodarone therapy 09/01/2015   PAF (paroxysmal atrial fibrillation) 09/01/2015   pt unaware   Pleural effusion 09/22/2015   Overview:  Left, post CABG with US guided thoracentesis   Premature ventricular contractions (PVCs) (VPCs) 04/14/2017   S/P CABG (coronary artery bypass graft) 04/14/2017   Skin cancer (melanoma) 04/14/2017   Urinary frequency    Urinary urgency    Venous insufficiency    Vitamin B 12 deficiency     Assessment/Plan: 1 Day Post-Op Procedure(s) (LRB): TOTAL KNEE ARTHROPLASTY (Left) Principal Problem:   OA (osteoarthritis) of knee Active Problems:   Primary osteoarthritis of left knee  Estimated body mass index is 24.78 kg/m as calculated from the following:   Height as of this encounter: 5\' 8"  (1.727 m).   Weight as of this encounter: 73.9 kg. Advance diet Up with therapy D/C IV fluids   Patient's anticipated LOS is less than 2 midnights, meeting these requirements: - Lives within 1 hour  of care - Has a competent adult at home to recover with post-op recover - NO history of  - Chronic pain requiring opioids  - Diabetes  - Heart failure  - Stroke  - Cardiac arrhythmia  - Respiratory Failure/COPD  - Renal failure  - Anemia  - Advanced Liver disease  DVT Prophylaxis - Aspirin Weight bearing as tolerated. Continue therapy.  Plan is to go Home after hospital stay. Plan for discharge later today if progresses with therapy and meeting goals. Scheduled for OPPT at [facility]. Follow-up in the office in 2 weeks.  The PDMP database was reviewed  today prior to any opioid medications being prescribed to this patient.  Arther Abbott, PA-C Orthopedic Surgery [phone] 11/09/2022, 7:36 AM

## 2022-11-09 NOTE — Plan of Care (Signed)
  Problem: Education: Goal: Knowledge of the prescribed therapeutic regimen will improve Outcome: Adequate for Discharge   Problem: Activity: Goal: Ability to avoid complications of mobility impairment will improve Outcome: Adequate for Discharge Goal: Range of joint motion will improve Outcome: Adequate for Discharge   Problem: Clinical Measurements: Goal: Postoperative complications will be avoided or minimized Outcome: Adequate for Discharge   Problem: Pain Management: Goal: Pain level will decrease with appropriate interventions Outcome: Adequate for Discharge   Problem: Skin Integrity: Goal: Will show signs of wound healing Outcome: Adequate for Discharge   Problem: Education: Goal: Knowledge of General Education information will improve Description: Including pain rating scale, medication(s)/side effects and non-pharmacologic comfort measures Outcome: Adequate for Discharge   Problem: Health Behavior/Discharge Planning: Goal: Ability to manage health-related needs will improve Outcome: Adequate for Discharge   Problem: Clinical Measurements: Goal: Ability to maintain clinical measurements within normal limits will improve Outcome: Adequate for Discharge Goal: Will remain free from infection Outcome: Adequate for Discharge Goal: Diagnostic test results will improve Outcome: Adequate for Discharge Goal: Respiratory complications will improve Outcome: Adequate for Discharge Goal: Cardiovascular complication will be avoided Outcome: Adequate for Discharge   Problem: Activity: Goal: Risk for activity intolerance will decrease Outcome: Adequate for Discharge   Problem: Nutrition: Goal: Adequate nutrition will be maintained Outcome: Adequate for Discharge   Problem: Coping: Goal: Level of anxiety will decrease Outcome: Adequate for Discharge   Problem: Elimination: Goal: Will not experience complications related to bowel motility Outcome: Adequate for  Discharge Goal: Will not experience complications related to urinary retention Outcome: Adequate for Discharge   Problem: Pain Managment: Goal: General experience of comfort will improve Outcome: Adequate for Discharge   Problem: Safety: Goal: Ability to remain free from injury will improve Outcome: Adequate for Discharge   Problem: Skin Integrity: Goal: Risk for impaired skin integrity will decrease Outcome: Adequate for Discharge

## 2022-11-10 NOTE — Discharge Summary (Signed)
Patient ID: VARDAAN LEIDIG MRN: [id number] DOB/AGE: 1932/05/20 89 y.o.  Admit date: 11/08/2022 Discharge date: 11/09/2022  Admission Diagnoses:  Principal Problem:   OA (osteoarthritis) of knee Active Problems:   Primary osteoarthritis of left knee   Discharge Diagnoses:  Same  Past Medical History:  Diagnosis Date   Abnormal stress echocardiogram 08/16/2015   Overview:  With post exercise LV dilation   Arthritis 04/14/2017   Borderline anemia 05/26/2018   pt denies   Chronic ischemic heart disease    CKD (chronic kidney disease) 04/14/2017   Coronary artery disease involving native coronary artery of native heart 04/14/2017   Coronary artery disease with stable angina pectoris 09/01/2015   pt denies chest pain   DVT (deep venous thrombosis)    Right leg   Dyspnea 2017   pt denies on proep of 10/27/22   Essential hypertension 07/22/2015   off of bl/p meds since 10/21/22 per DR Redding   Gout 04/14/2017   Hyperlipidemia 07/22/2015   Incomplete right bundle branch block (RBBB)    Noted on EKG 01/31/2018   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   Leukocytosis 04/14/2017   Nocturia    Obesity 04/14/2017   On amiodarone therapy 09/01/2015   PAF (paroxysmal atrial fibrillation) 09/01/2015   pt unaware   Pleural effusion 09/22/2015   Overview:  Left, post CABG with US guided thoracentesis   Premature ventricular contractions (PVCs) (VPCs) 04/14/2017   S/P CABG (coronary artery bypass graft) 04/14/2017   Skin cancer (melanoma) 04/14/2017   Urinary frequency    Urinary urgency    Venous insufficiency    Vitamin B 12 deficiency     Surgeries: Procedure(s): TOTAL KNEE ARTHROPLASTY on 11/08/2022   Consultants:   Discharged Condition: Improved  Hospital Course: ANGELES KOELLNER is an 89 y.o. male who was admitted 11/08/2022 for operative treatment ofOA (osteoarthritis) of knee. Patient has severe unremitting pain that affects sleep, daily activities, and work/hobbies. After pre-op  clearance the patient was taken to the operating room on 11/08/2022 and underwent  Procedure(s): TOTAL KNEE ARTHROPLASTY.    Patient was given perioperative antibiotics:  Anti-infectives (From admission, onward)    Start     Dose/Rate Route Frequency Ordered Stop   11/08/22 1400  ceFAZolin (ANCEF) IVPB 2g/100 mL premix        2 g 200 mL/hr over 30 Minutes Intravenous Every 6 hours 11/08/22 1320 11/08/22 2132   11/08/22 0600  ceFAZolin (ANCEF) IVPB 2g/100 mL premix        2 g 200 mL/hr over 30 Minutes Intravenous On call to O.R. 11/08/22 0263 11/08/22 0841        Patient was given sequential compression devices, early ambulation, and chemoprophylaxis to prevent DVT.  Patient benefited maximally from hospital stay and there were no complications.    Recent vital signs: Patient Vitals for the past 24 hrs:  BP Temp Temp src Pulse Resp SpO2  11/09/22 0921 (!) 100/59 97.8 F (36.6 C) Oral 66 18 97 %     Recent laboratory studies:  Recent Labs    11/09/22 0335  WBC 11.9*  HGB 10.2*  HCT 31.4*  PLT 130*  NA 136  K 4.2  CL 106  CO2 24  BUN 26*  CREATININE 1.02  GLUCOSE 126*  CALCIUM 8.3*     Discharge Medications:   Allergies as of 11/09/2022   No Known Allergies      Medication List     STOP taking these medications  aspirin EC 81 MG tablet   Ibuprofen 200 MG Caps   multivitamin with minerals Tabs tablet       TAKE these medications    allopurinol 100 MG tablet Commonly known as: ZYLOPRIM Take 100 mg by mouth 2 (two) times daily.   cetirizine 10 MG tablet Commonly known as: ZYRTEC Take 10 mg by mouth daily as needed for allergies.   ezetimibe 10 MG tablet Commonly known as: ZETIA Take 1 tablet (10 mg total) by mouth daily.   methocarbamol 500 MG tablet Commonly known as: ROBAXIN Take 1 tablet (500 mg total) by mouth every 6 (six) hours as needed for muscle spasms.   oxyCODONE 5 MG immediate release tablet Commonly known as: Oxy  IR/ROXICODONE Take 1-2 tablets (5-10 mg total) by mouth every 8 (eight) hours as needed for severe pain.   prednisoLONE acetate 1 % ophthalmic suspension Commonly known as: PRED FORTE Place 1 drop into both eyes daily.   rivaroxaban 10 MG Tabs tablet Commonly known as: XARELTO Take 1 tablet (10 mg total) by mouth daily with breakfast for 20 days. Then resume one 81 mg aspirin once a day   spironolactone 25 MG tablet Commonly known as: ALDACTONE Take 1 tablet (25 mg total) by mouth daily.   traMADol 50 MG tablet Commonly known as: ULTRAM Take 1-2 tablets (50-100 mg total) by mouth every 6 (six) hours as needed for moderate pain.               Discharge Care Instructions  (From admission, onward)           Start     Ordered   11/09/22 0000  Weight bearing as tolerated        11/09/22 0739   11/09/22 0000  Change dressing       Comments: You may remove the bulky bandage (ACE wrap and gauze) two days after surgery. You will have an adhesive waterproof bandage underneath. Leave this in place until your first follow-up appointment.   11/09/22 0739            Diagnostic Studies: No results found.  Disposition: Discharge disposition: 01-Home or Self Care       Discharge Instructions     Call MD / Call 911   Complete by: As directed    If you experience chest pain or shortness of breath, CALL 911 and be transported to the hospital emergency room.  If you develope a fever above 101 F, pus (white drainage) or increased drainage or redness at the wound, or calf pain, call your surgeon's office.   Change dressing   Complete by: As directed    You may remove the bulky bandage (ACE wrap and gauze) two days after surgery. You will have an adhesive waterproof bandage underneath. Leave this in place until your first follow-up appointment.   Constipation Prevention   Complete by: As directed    Drink plenty of fluids.  Prune juice may be helpful.  You may use a stool  softener, such as Colace (over the counter) 100 mg twice a day.  Use MiraLax (over the counter) for constipation as needed.   Diet - low sodium heart healthy   Complete by: As directed    Do not put a pillow under the knee. Place it under the heel.   Complete by: As directed    Driving restrictions   Complete by: As directed    No driving for two weeks   Post-operative opioid taper  instructions:   Complete by: As directed    POST-OPERATIVE OPIOID TAPER INSTRUCTIONS: It is important to wean off of your opioid medication as soon as possible. If you do not need pain medication after your surgery it is ok to stop day one. Opioids include: Codeine, Hydrocodone(Norco, Vicodin), Oxycodone(Percocet, oxycontin) and hydromorphone amongst others.  Long term and even short term use of opiods can cause: Increased pain response Dependence Constipation Depression Respiratory depression And more.  Withdrawal symptoms can include Flu like symptoms Nausea, vomiting And more Techniques to manage these symptoms Hydrate well Eat regular healthy meals Stay active Use relaxation techniques(deep breathing, meditating, yoga) Do Not substitute Alcohol to help with tapering If you have been on opioids for less than two weeks and do not have pain than it is ok to stop all together.  Plan to wean off of opioids This plan should start within one week post op of your joint replacement. Maintain the same interval or time between taking each dose and first decrease the dose.  Cut the total daily intake of opioids by one tablet each day Next start to increase the time between doses. The last dose that should be eliminated is the evening dose.      TED hose   Complete by: As directed    Use stockings (TED hose) for three weeks on both leg(s).  You may remove them at night for sleeping.   Weight bearing as tolerated   Complete by: As directed         Follow-up Information     Cory Munch,  PA-C. Go on 11/22/2022.   Specialty: Orthopedic Surgery Why: You are scheduled for a follow up appointment on 11-22-22 at 10:15 am. Contact information: [address], [address] [place] [address] [phone]                  Signed: Arther Abbott 11/10/2022, 8:49 AM

## 2022-11-11 DIAGNOSIS — R269 Unspecified abnormalities of gait and mobility: Secondary | ICD-10-CM | POA: Diagnosis not present

## 2022-11-11 DIAGNOSIS — M25562 Pain in left knee: Secondary | ICD-10-CM | POA: Diagnosis not present

## 2022-11-11 DIAGNOSIS — M6281 Muscle weakness (generalized): Secondary | ICD-10-CM | POA: Diagnosis not present

## 2022-11-12 DIAGNOSIS — M6281 Muscle weakness (generalized): Secondary | ICD-10-CM | POA: Diagnosis not present

## 2022-11-12 DIAGNOSIS — R269 Unspecified abnormalities of gait and mobility: Secondary | ICD-10-CM | POA: Diagnosis not present

## 2022-11-12 DIAGNOSIS — M25562 Pain in left knee: Secondary | ICD-10-CM | POA: Diagnosis not present

## 2022-11-15 DIAGNOSIS — R269 Unspecified abnormalities of gait and mobility: Secondary | ICD-10-CM | POA: Diagnosis not present

## 2022-11-15 DIAGNOSIS — M6281 Muscle weakness (generalized): Secondary | ICD-10-CM | POA: Diagnosis not present

## 2022-11-15 DIAGNOSIS — M25562 Pain in left knee: Secondary | ICD-10-CM | POA: Diagnosis not present

## 2022-11-16 ENCOUNTER — Encounter (HOSPITAL_COMMUNITY): Payer: Self-pay | Admitting: Orthopedic Surgery

## 2022-11-17 DIAGNOSIS — M6281 Muscle weakness (generalized): Secondary | ICD-10-CM | POA: Diagnosis not present

## 2022-11-17 DIAGNOSIS — M25562 Pain in left knee: Secondary | ICD-10-CM | POA: Diagnosis not present

## 2022-11-17 DIAGNOSIS — R269 Unspecified abnormalities of gait and mobility: Secondary | ICD-10-CM | POA: Diagnosis not present

## 2022-11-19 DIAGNOSIS — M6281 Muscle weakness (generalized): Secondary | ICD-10-CM | POA: Diagnosis not present

## 2022-11-19 DIAGNOSIS — R269 Unspecified abnormalities of gait and mobility: Secondary | ICD-10-CM | POA: Diagnosis not present

## 2022-11-19 DIAGNOSIS — M25562 Pain in left knee: Secondary | ICD-10-CM | POA: Diagnosis not present

## 2022-11-22 ENCOUNTER — Other Ambulatory Visit (HOSPITAL_COMMUNITY): Payer: Self-pay | Admitting: Physician Assistant

## 2022-11-22 ENCOUNTER — Ambulatory Visit (HOSPITAL_COMMUNITY)
Admission: RE | Admit: 2022-11-22 | Discharge: 2022-11-22 | Disposition: A | Discharge status: Home / Self Care | Payer: Medicare Other | Source: Ambulatory Visit | Attending: Cardiology | Admitting: Cardiology

## 2022-11-22 DIAGNOSIS — M25562 Pain in left knee: Secondary | ICD-10-CM | POA: Diagnosis not present

## 2022-11-22 DIAGNOSIS — M79605 Pain in left leg: Secondary | ICD-10-CM | POA: Diagnosis not present

## 2022-11-22 DIAGNOSIS — M79606 Pain in leg, unspecified: Secondary | ICD-10-CM | POA: Insufficient documentation

## 2022-11-22 DIAGNOSIS — R269 Unspecified abnormalities of gait and mobility: Secondary | ICD-10-CM | POA: Diagnosis not present

## 2022-11-22 DIAGNOSIS — M6281 Muscle weakness (generalized): Secondary | ICD-10-CM | POA: Diagnosis not present

## 2022-11-24 DIAGNOSIS — R269 Unspecified abnormalities of gait and mobility: Secondary | ICD-10-CM | POA: Diagnosis not present

## 2022-11-24 DIAGNOSIS — M25562 Pain in left knee: Secondary | ICD-10-CM | POA: Diagnosis not present

## 2022-11-24 DIAGNOSIS — M6281 Muscle weakness (generalized): Secondary | ICD-10-CM | POA: Diagnosis not present

## 2022-11-26 DIAGNOSIS — M6281 Muscle weakness (generalized): Secondary | ICD-10-CM | POA: Diagnosis not present

## 2022-11-26 DIAGNOSIS — R269 Unspecified abnormalities of gait and mobility: Secondary | ICD-10-CM | POA: Diagnosis not present

## 2022-11-26 DIAGNOSIS — M25562 Pain in left knee: Secondary | ICD-10-CM | POA: Diagnosis not present

## 2022-11-29 DIAGNOSIS — M25562 Pain in left knee: Secondary | ICD-10-CM | POA: Diagnosis not present

## 2022-11-29 DIAGNOSIS — M6281 Muscle weakness (generalized): Secondary | ICD-10-CM | POA: Diagnosis not present

## 2022-11-29 DIAGNOSIS — R269 Unspecified abnormalities of gait and mobility: Secondary | ICD-10-CM | POA: Diagnosis not present

## 2022-12-03 DIAGNOSIS — M25562 Pain in left knee: Secondary | ICD-10-CM | POA: Diagnosis not present

## 2022-12-03 DIAGNOSIS — R269 Unspecified abnormalities of gait and mobility: Secondary | ICD-10-CM | POA: Diagnosis not present

## 2022-12-03 DIAGNOSIS — M6281 Muscle weakness (generalized): Secondary | ICD-10-CM | POA: Diagnosis not present

## 2022-12-06 DIAGNOSIS — M6281 Muscle weakness (generalized): Secondary | ICD-10-CM | POA: Diagnosis not present

## 2022-12-06 DIAGNOSIS — R269 Unspecified abnormalities of gait and mobility: Secondary | ICD-10-CM | POA: Diagnosis not present

## 2022-12-06 DIAGNOSIS — M25562 Pain in left knee: Secondary | ICD-10-CM | POA: Diagnosis not present

## 2022-12-08 DIAGNOSIS — E538 Deficiency of other specified B group vitamins: Secondary | ICD-10-CM | POA: Diagnosis not present

## 2022-12-08 DIAGNOSIS — R269 Unspecified abnormalities of gait and mobility: Secondary | ICD-10-CM | POA: Diagnosis not present

## 2022-12-08 DIAGNOSIS — M25562 Pain in left knee: Secondary | ICD-10-CM | POA: Diagnosis not present

## 2022-12-08 DIAGNOSIS — M6281 Muscle weakness (generalized): Secondary | ICD-10-CM | POA: Diagnosis not present

## 2022-12-10 DIAGNOSIS — R269 Unspecified abnormalities of gait and mobility: Secondary | ICD-10-CM | POA: Diagnosis not present

## 2022-12-10 DIAGNOSIS — M6281 Muscle weakness (generalized): Secondary | ICD-10-CM | POA: Diagnosis not present

## 2022-12-10 DIAGNOSIS — M25562 Pain in left knee: Secondary | ICD-10-CM | POA: Diagnosis not present

## 2022-12-13 DIAGNOSIS — M6281 Muscle weakness (generalized): Secondary | ICD-10-CM | POA: Diagnosis not present

## 2022-12-13 DIAGNOSIS — R269 Unspecified abnormalities of gait and mobility: Secondary | ICD-10-CM | POA: Diagnosis not present

## 2022-12-13 DIAGNOSIS — M25562 Pain in left knee: Secondary | ICD-10-CM | POA: Diagnosis not present

## 2022-12-15 DIAGNOSIS — M6281 Muscle weakness (generalized): Secondary | ICD-10-CM | POA: Diagnosis not present

## 2022-12-15 DIAGNOSIS — R269 Unspecified abnormalities of gait and mobility: Secondary | ICD-10-CM | POA: Diagnosis not present

## 2022-12-15 DIAGNOSIS — M25562 Pain in left knee: Secondary | ICD-10-CM | POA: Diagnosis not present

## 2022-12-16 DIAGNOSIS — Z96641 Presence of right artificial hip joint: Secondary | ICD-10-CM | POA: Insufficient documentation

## 2022-12-17 DIAGNOSIS — Z96641 Presence of right artificial hip joint: Secondary | ICD-10-CM | POA: Diagnosis not present

## 2022-12-17 DIAGNOSIS — Z471 Aftercare following joint replacement surgery: Secondary | ICD-10-CM | POA: Diagnosis not present

## 2022-12-17 DIAGNOSIS — Z96652 Presence of left artificial knee joint: Secondary | ICD-10-CM | POA: Diagnosis not present

## 2022-12-22 DIAGNOSIS — M25562 Pain in left knee: Secondary | ICD-10-CM | POA: Diagnosis not present

## 2022-12-22 DIAGNOSIS — M6281 Muscle weakness (generalized): Secondary | ICD-10-CM | POA: Diagnosis not present

## 2022-12-22 DIAGNOSIS — R269 Unspecified abnormalities of gait and mobility: Secondary | ICD-10-CM | POA: Diagnosis not present

## 2022-12-27 DIAGNOSIS — L570 Actinic keratosis: Secondary | ICD-10-CM | POA: Diagnosis not present

## 2022-12-27 DIAGNOSIS — R269 Unspecified abnormalities of gait and mobility: Secondary | ICD-10-CM | POA: Diagnosis not present

## 2022-12-27 DIAGNOSIS — D225 Melanocytic nevi of trunk: Secondary | ICD-10-CM | POA: Diagnosis not present

## 2022-12-27 DIAGNOSIS — M25562 Pain in left knee: Secondary | ICD-10-CM | POA: Diagnosis not present

## 2022-12-27 DIAGNOSIS — M6281 Muscle weakness (generalized): Secondary | ICD-10-CM | POA: Diagnosis not present

## 2022-12-27 DIAGNOSIS — D2239 Melanocytic nevi of other parts of face: Secondary | ICD-10-CM | POA: Diagnosis not present

## 2022-12-27 DIAGNOSIS — L821 Other seborrheic keratosis: Secondary | ICD-10-CM | POA: Diagnosis not present

## 2022-12-27 DIAGNOSIS — L820 Inflamed seborrheic keratosis: Secondary | ICD-10-CM | POA: Diagnosis not present

## 2022-12-31 DIAGNOSIS — M25562 Pain in left knee: Secondary | ICD-10-CM | POA: Diagnosis not present

## 2022-12-31 DIAGNOSIS — M6281 Muscle weakness (generalized): Secondary | ICD-10-CM | POA: Diagnosis not present

## 2022-12-31 DIAGNOSIS — R269 Unspecified abnormalities of gait and mobility: Secondary | ICD-10-CM | POA: Diagnosis not present

## 2023-01-03 DIAGNOSIS — M25562 Pain in left knee: Secondary | ICD-10-CM | POA: Diagnosis not present

## 2023-01-03 DIAGNOSIS — R269 Unspecified abnormalities of gait and mobility: Secondary | ICD-10-CM | POA: Diagnosis not present

## 2023-01-03 DIAGNOSIS — M6281 Muscle weakness (generalized): Secondary | ICD-10-CM | POA: Diagnosis not present

## 2023-01-05 DIAGNOSIS — R269 Unspecified abnormalities of gait and mobility: Secondary | ICD-10-CM | POA: Diagnosis not present

## 2023-01-05 DIAGNOSIS — M6281 Muscle weakness (generalized): Secondary | ICD-10-CM | POA: Diagnosis not present

## 2023-01-05 DIAGNOSIS — M25562 Pain in left knee: Secondary | ICD-10-CM | POA: Diagnosis not present

## 2023-03-23 DIAGNOSIS — D519 Vitamin B12 deficiency anemia, unspecified: Secondary | ICD-10-CM | POA: Diagnosis not present

## 2023-04-15 ENCOUNTER — Ambulatory Visit: Payer: Medicare Other | Admitting: Cardiology

## 2023-05-02 DIAGNOSIS — D519 Vitamin B12 deficiency anemia, unspecified: Secondary | ICD-10-CM | POA: Diagnosis not present

## 2023-05-13 IMAGING — RF DG HIP (WITH PELVIS) OPERATIVE*R*
1 series · 2 of 2 positions shown · non-contrast
Comparison: None.

CLINICAL DATA: Right anterior hip arthroplasty.

EXAM:
OPERATIVE RIGHT HIP (WITH PELVIS IF PERFORMED)
TECHNIQUE: Fluoroscopic spot image(s) were submitted for interpretation
post-operatively.

[Series 1: unknown protocol · 0.20mm/px · 2 of 2 slices shown]
[im 1/2]
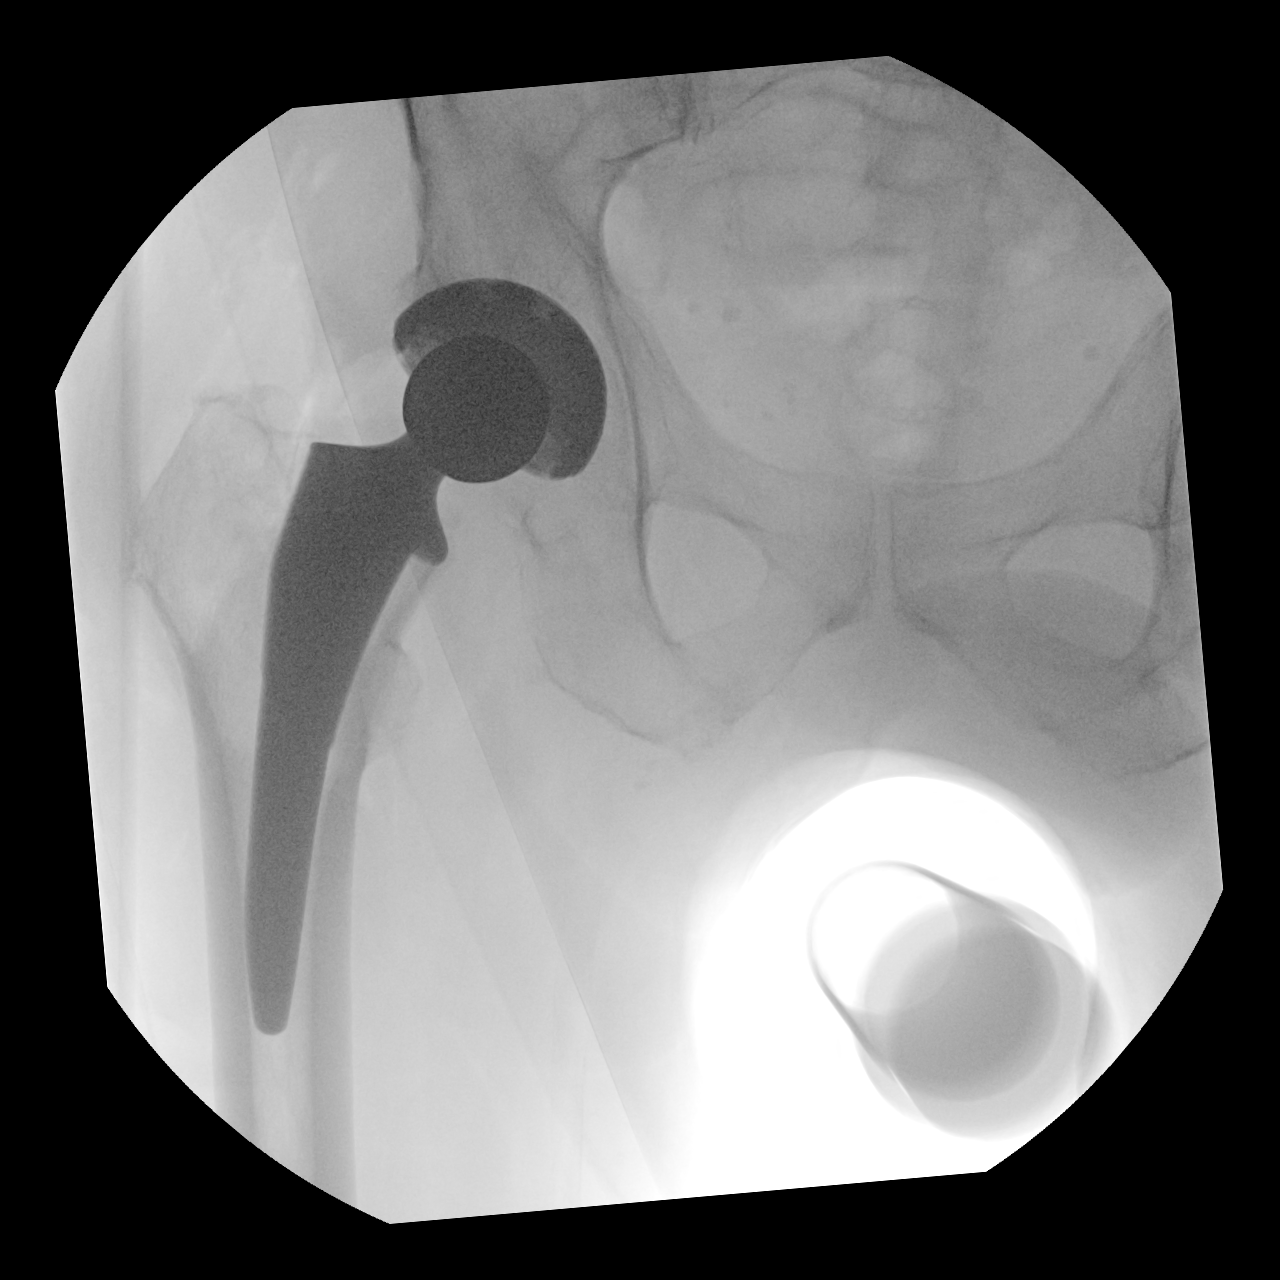
[im 2/2]
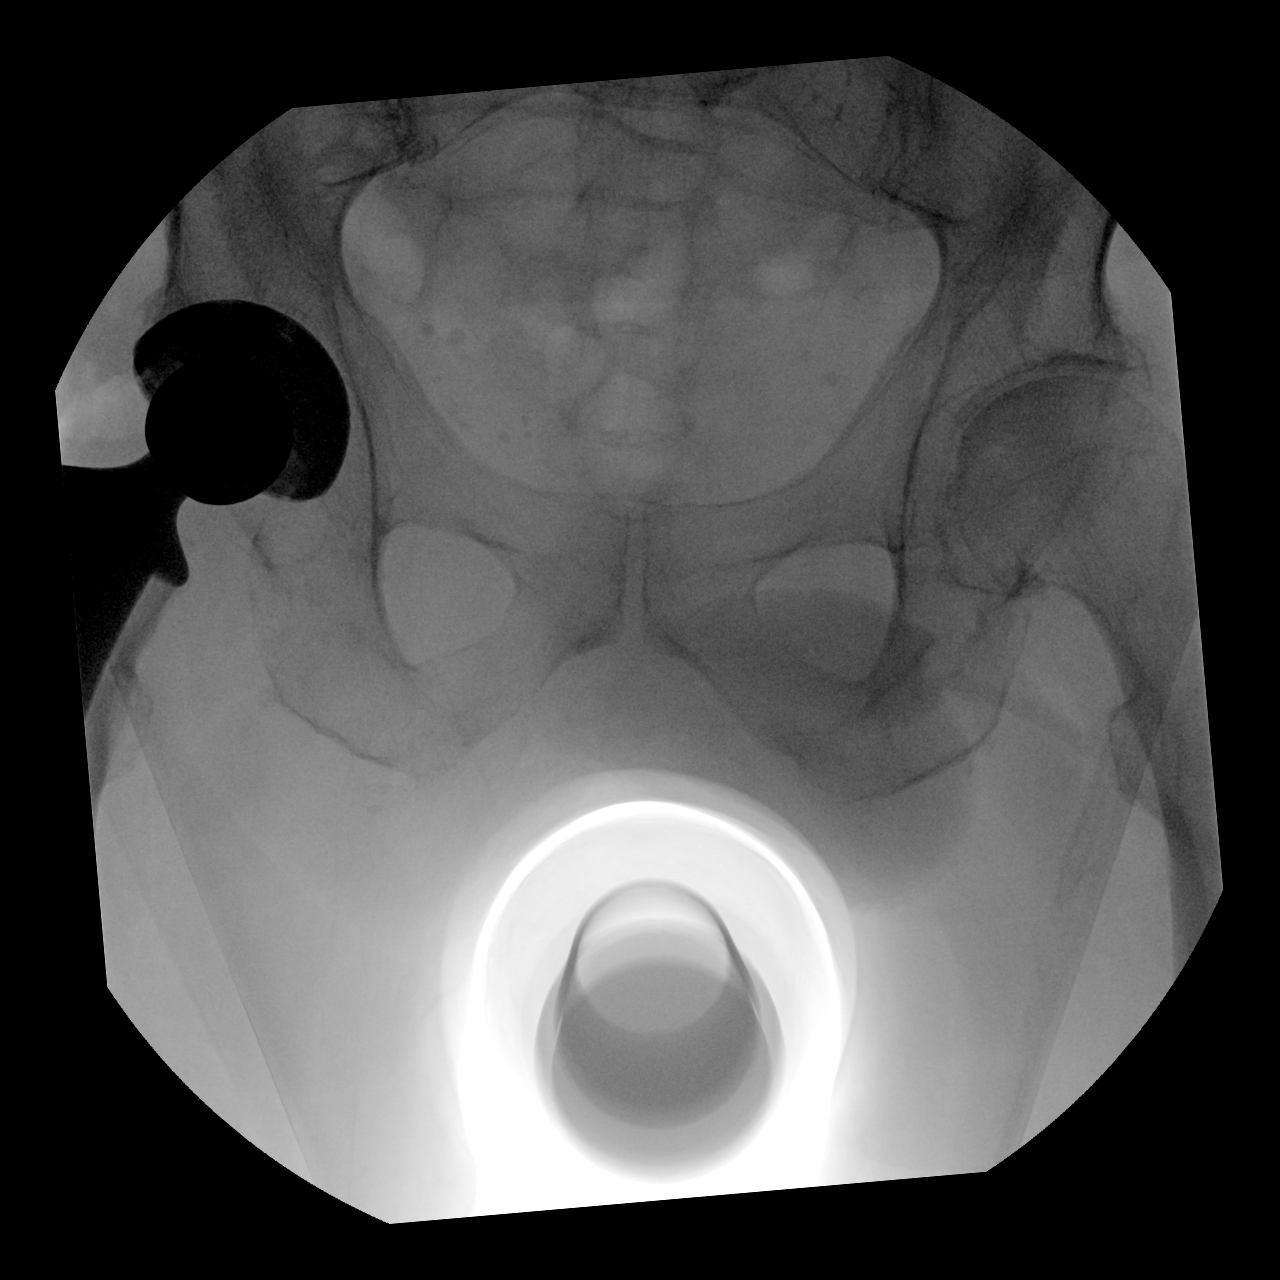

[2 of 2 positions shown; findings below may reference images not displayed]

FINDINGS: Two fluoroscopic spot views of the pelvis and right hip obtained in
the operating room. Right hip arthroplasty in place. Fluoroscopy
time 6 seconds.
IMPRESSION: Fluoroscopy for right hip arthroplasty.

## 2023-05-13 IMAGING — DX DG PORTABLE PELVIS
1 series · 1 of 1 positions shown · non-contrast
Comparison: Intraoperative films from earlier in the same day

CLINICAL DATA: Status post right hip arthroplasty, initial
encounter

EXAM:
PORTABLE PELVIS 1-2 VIEWS

[pelvis ap]
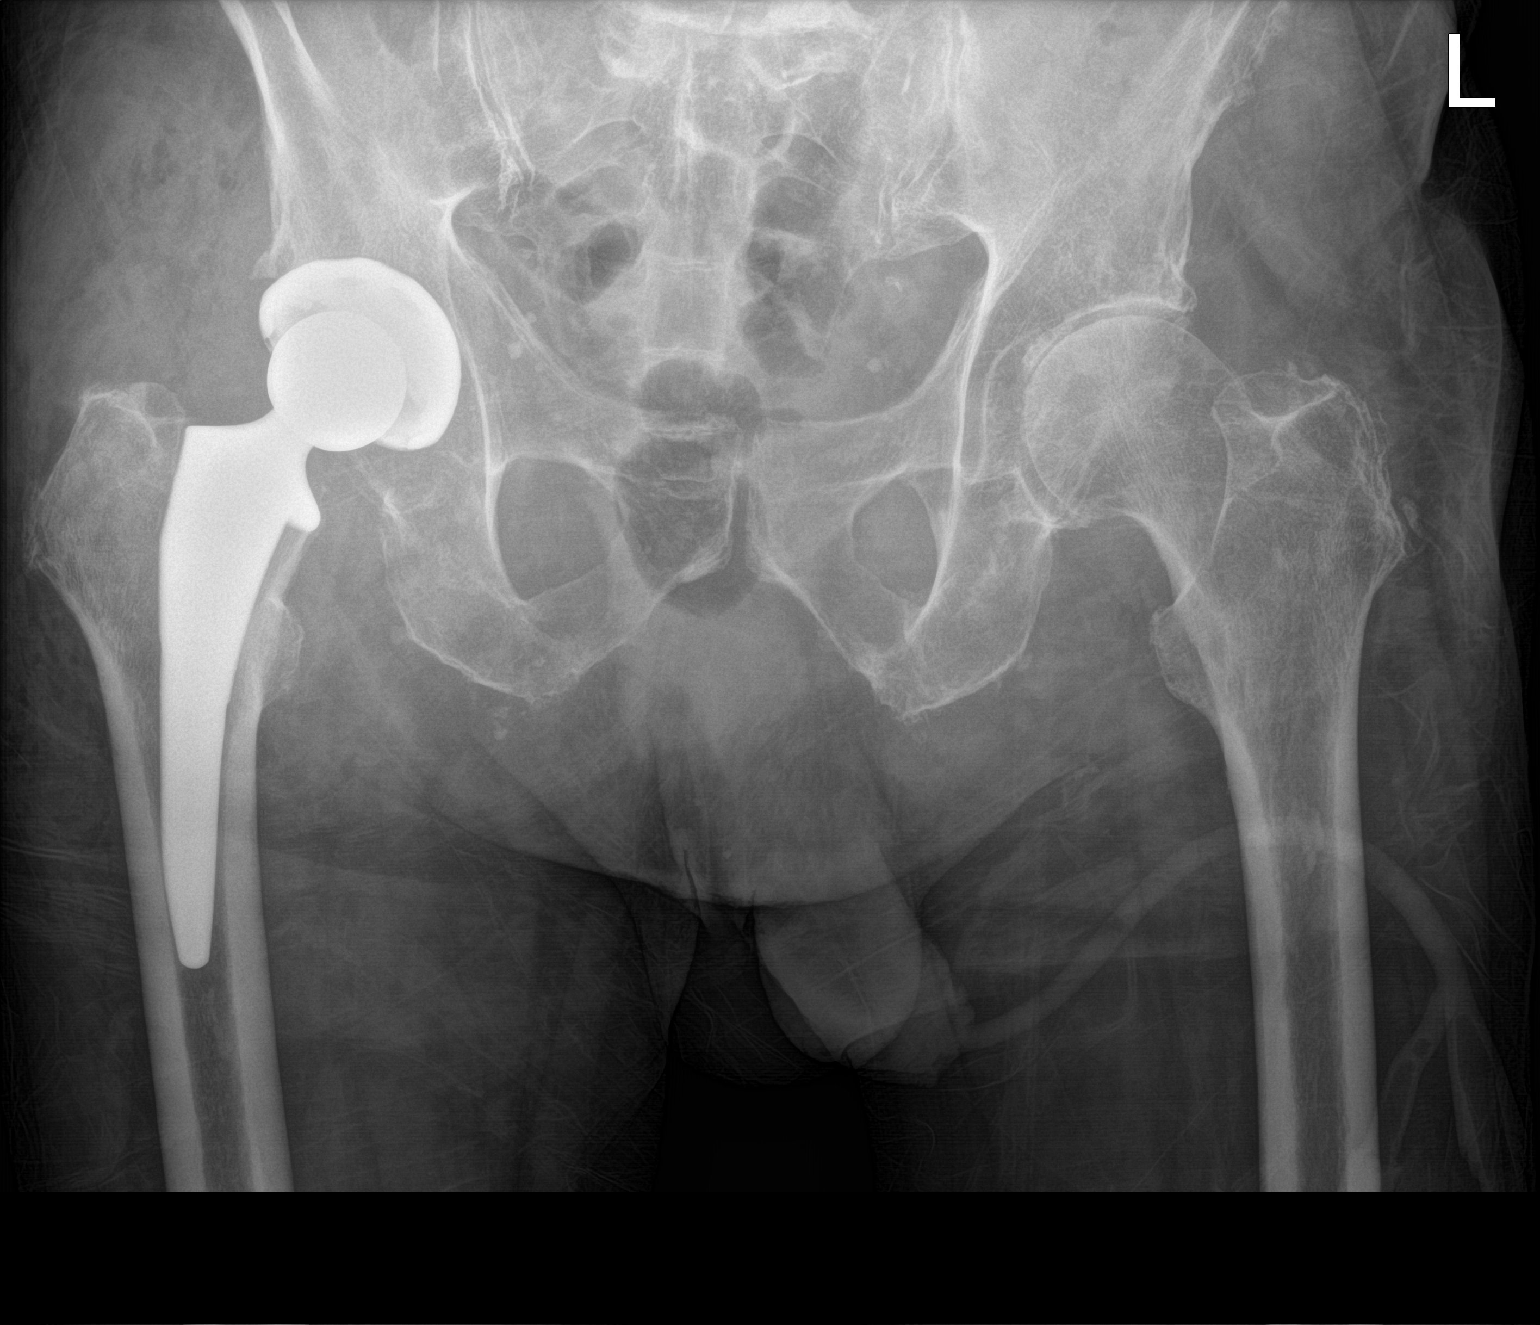

[1 of 1 positions shown; findings below may reference images not displayed]

FINDINGS: Right hip replacement is again seen and stable. No acute bony
abnormality is noted. No soft tissue changes are seen.
IMPRESSION: Status post right hip replacement.

## 2023-05-24 NOTE — Progress Notes (Unsigned)
Cardiology Office Note:    Date:  05/25/2023   ID:  Ronald Dunn, DOB 1932/07/05, MRN [id number]  PCP:  Ronald Helling, DO  Cardiologist:  Ronald Herrlich, MD    Referring MD: Ronald Saupe, MD    ASSESSMENT:    1. Coronary artery disease, unspecified vessel or lesion type, unspecified whether angina present, unspecified whether native or transplanted heart   2. Essential hypertension   3. Chronic kidney disease, unspecified CKD stage   4. Mixed hyperlipidemia   5. RBBB with left anterior fascicular block   6. 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 Will continue to check trend and record home blood pressures Reach check labs today off his diuretic including CMP liver function lipids and renal function. Edema is resolved   Next appointment: 6 months   Medication Adjustments/Labs and Tests Ordered: Current medicines are reviewed at length with the patient today.  Concerns regarding medicines are outlined above.  No orders of the defined types were placed in this encounter.  No orders of the defined types were placed in this encounter.    History of Present Illness:    Ronald Dunn is a 89 y.o. male with a hx of CAD with CABG 2017 hypertension with CKD bifascicular heart block hyperlipidemia and chronic lower extremity edema evaluated vascular surgery to have found chronic venous insufficiency and lymphedema last seen 09/03/2022.  Following the visit he had an event monitor reported 09/22/2022 with isolated supraventricular and ventricular ectopy and 44 episodes of SVT longest 4-1/2 minutes.  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) 09/01/2015   pt denies chest pain   DVT (deep venous thrombosis) (HCC)    Right leg   Dyspnea 2017   pt denies on proep of 10/27/22   Essential hypertension 07/22/2015   off of bl/p meds since 10/21/22 per DR Redding   Gout 04/14/2017   Hyperlipidemia 07/22/2015   Incomplete right bundle branch block (RBBB)    Noted on EKG 01/31/2018   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 Coronary artery disease, unspecified vessel or lesion type, unspecified whether angina present, unspecified whether native or transplanted heart   2. Essential hypertension   3. Chronic kidney disease, unspecified CKD stage   4. Mixed hyperlipidemia   5. RBBB with left anterior fascicular block   6. 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 Will continue to check trend and record home blood pressures Reach check labs today off his diuretic including CMP liver function lipids and renal function. Edema is resolved   Next appointment: 6 months   Medication Adjustments/Labs and Tests Ordered: Current medicines are reviewed at length with the patient today.  Concerns regarding medicines are outlined above.  No orders of the defined types were placed in this encounter.  No orders of the defined types were placed in this encounter.    History of Present Illness:    Ronald Dunn is a 89 y.o. male with a hx of CAD with CABG 2017 hypertension with CKD bifascicular heart block hyperlipidemia and chronic lower extremity edema evaluated vascular surgery to have found chronic venous insufficiency and lymphedema last seen 09/03/2022.  Following the visit he had an event monitor reported 09/22/2022 with isolated supraventricular and ventricular ectopy and 44 episodes of SVT longest 4-1/2 minutes.  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) 09/01/2015   pt denies chest pain   DVT (deep venous thrombosis) (HCC)    Right leg   Dyspnea 2017   pt denies on proep of 10/27/22   Essential hypertension 07/22/2015   off of bl/p meds since 10/21/22 per DR Redding   Gout 04/14/2017   Hyperlipidemia 07/22/2015   Incomplete right bundle branch block (RBBB)    Noted on EKG 01/31/2018   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 Coronary artery disease, unspecified vessel or lesion type, unspecified whether angina present, unspecified whether native or transplanted heart   2. Essential hypertension   3. Chronic kidney disease, unspecified CKD stage   4. Mixed hyperlipidemia   5. RBBB with left anterior fascicular block   6. 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 Will continue to check trend and record home blood pressures Reach check labs today off his diuretic including CMP liver function lipids and renal function. Edema is resolved   Next appointment: 6 months   Medication Adjustments/Labs and Tests Ordered: Current medicines are reviewed at length with the patient today.  Concerns regarding medicines are outlined above.  No orders of the defined types were placed in this encounter.  No orders of the defined types were placed in this encounter.    History of Present Illness:    Ronald Dunn is a 89 y.o. male with a hx of CAD with CABG 2017 hypertension with CKD bifascicular heart block hyperlipidemia and chronic lower extremity edema evaluated vascular surgery to have found chronic venous insufficiency and lymphedema last seen 09/03/2022.  Following the visit he had an event monitor reported 09/22/2022 with isolated supraventricular and ventricular ectopy and 44 episodes of SVT longest 4-1/2 minutes.  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) 09/01/2015   pt denies chest pain   DVT (deep venous thrombosis) (HCC)    Right leg   Dyspnea 2017   pt denies on proep of 10/27/22   Essential hypertension 07/22/2015   off of bl/p meds since 10/21/22 per DR Redding   Gout 04/14/2017   Hyperlipidemia 07/22/2015   Incomplete right bundle branch block (RBBB)    Noted on EKG 01/31/2018   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   Leukocytosis 04/14/2017   Nocturia    Obesity 04/14/2017   On amiodarone therapy 09/01/2015   PAF (paroxysmal atrial fibrillation) (HCC) 09/01/2015   pt unaware   Pleural effusion 09/22/2015   Overview:  Left, post CABG with US guided thoracentesis  Premature ventricular contractions (PVCs) (VPCs) 04/14/2017   S/P CABG (coronary artery bypass graft) 04/14/2017   Skin cancer (melanoma) (HCC) 04/14/2017   Urinary frequency    Urinary urgency    Venous insufficiency    Vitamin B 12 deficiency     Current Medications: Current Meds  Medication Sig   allopurinol (ZYLOPRIM) 100 MG tablet Take 100 mg by mouth 2 (two) times daily.    ezetimibe (ZETIA) 10 MG tablet Take 1  tablet (10 mg total) by mouth daily.   prednisoLONE acetate (PRED FORTE) 1 % ophthalmic suspension Place 1 drop into both eyes daily.      EKGs/Labs/Other Studies Reviewed:    The following studies were reviewed today:  Cardiac Studies & Procedures       ECHOCARDIOGRAM  ECHOCARDIOGRAM COMPLETE 12/12/2020  Narrative ECHOCARDIOGRAM REPORT    Patient Name:   Ronald Dunn Date of Exam: 12/12/2020 Medical Rec #:  [id number]          Height:       69.0 in Accession #:    [id number]         Weight:       167.0 lb Date of Birth:  02-15-1932         BSA:          1.914 m Patient Age:    88 years           BP:           126/64 mmHg Patient Gender: M                  HR:           72 bpm. Exam Location:  [facility]  Procedure: 2D Echo  Indications:    Bilateral leg edema [R60.0 (ICD-10-CM)]  History:        Patient has no prior history of Echocardiogram examinations. Prior CABG, Arrythmias:Atrial Fibrillation; Risk Factors:Hypertension.  Sonographer:    Louie Boston Referring Phys: [phone] Donzella Carrol J Dulcinea Kinser  IMPRESSIONS   1. Left ventricular ejection fraction, by estimation, is 60 to 65%. The left ventricle has normal function. The left ventricle has no regional wall motion abnormalities. There is mild left ventricular hypertrophy. 2. Right ventricular systolic function is normal. The right ventricular size is normal. There is normal pulmonary artery systolic pressure. 3. The mitral valve is normal in structure. Mild mitral valve regurgitation. No evidence of mitral stenosis. 4. The aortic valve is normal in structure. Aortic valve regurgitation is not visualized. Mild aortic valve sclerosis is present, with no evidence of aortic valve stenosis. 5. Descending aorta 2.3 cm. 6. The inferior vena cava is normal in size with greater than 50% respiratory variability, suggesting right atrial pressure of 3 mmHg.  FINDINGS Left Ventricle: Left ventricular ejection fraction, by estimation,  is 60 to 65%. The left ventricle has normal function. The left ventricle has no regional wall motion abnormalities. The left ventricular internal cavity size was normal in size. There is mild left ventricular hypertrophy. Left ventricular diastolic parameters were normal.  Right Ventricle: The right ventricular size is normal. No increase in right ventricular wall thickness. Right ventricular systolic function is normal. There is normal pulmonary artery systolic pressure. The tricuspid regurgitant velocity is 2.73 m/s, and with an assumed right atrial pressure of 3 mmHg, the estimated right ventricular systolic pressure is 32.8 mmHg.  Left Atrium: Left atrial size was normal in size.  Right Atrium: Right atrial size was normal in size.  Pericardium:

## 2023-05-25 ENCOUNTER — Encounter: Payer: Self-pay | Admitting: Cardiology

## 2023-05-25 ENCOUNTER — Ambulatory Visit: Payer: Medicare Other | Attending: Cardiology | Admitting: Cardiology

## 2023-05-25 VITALS — BP 98/65 | HR 68 | Resp 16 | Ht 68.0 in | Wt 164.2 lb

## 2023-05-25 DIAGNOSIS — N189 Chronic kidney disease, unspecified: Secondary | ICD-10-CM | POA: Diagnosis not present

## 2023-05-25 DIAGNOSIS — I452 Bifascicular block: Secondary | ICD-10-CM | POA: Insufficient documentation

## 2023-05-25 DIAGNOSIS — R600 Localized edema: Secondary | ICD-10-CM | POA: Diagnosis not present

## 2023-05-25 DIAGNOSIS — I2510 Atherosclerotic heart disease of native coronary artery without angina pectoris: Secondary | ICD-10-CM | POA: Diagnosis not present

## 2023-05-25 DIAGNOSIS — E782 Mixed hyperlipidemia: Secondary | ICD-10-CM | POA: Insufficient documentation

## 2023-05-25 DIAGNOSIS — I10 Essential (primary) hypertension: Secondary | ICD-10-CM | POA: Insufficient documentation

## 2023-05-25 NOTE — Patient Instructions (Addendum)
Medication Instructions:  Your physician has recommended you make the following change in your medication:   STOP: Spirinolactone  *If you need a refill on your cardiac medications before your next appointment, please call your pharmacy*   Lab Work: Your physician recommends that you return for lab work in:   Labs today: CMP, Lipid   If you have labs (blood work) drawn today and your tests are completely normal, you will receive your results only by: MyChart Message (if you have MyChart) OR A paper copy in the mail If you have any lab test that is abnormal or we need to change your treatment, we will call you to review the results.   Testing/Procedures: None   Follow-Up: At [facility], you and your health needs are our priority.  As part of our continuing mission to provide you with exceptional heart care, we have created designated Provider Care Teams.  These Care Teams include your primary Cardiologist (physician) and Advanced Practice Providers (APPs -  Physician Assistants and Nurse Practitioners) who all work together to provide you with the care you need, when you need it.  We recommend signing up for the patient portal called "MyChart".  Sign up information is provided on this After Visit Summary.  MyChart is used to connect with patients for Virtual Visits (Telemedicine).  Patients are able to view lab/test results, encounter notes, upcoming appointments, etc.  Non-urgent messages can be sent to your provider as well.   To learn more about what you can do with MyChart, go to [email or web address].    Your next appointment:   6 month(s)  Provider:   Norman Herrlich, MD    Other Instructions None  [facility]  Tips to measure your blood pressure correctly  To determine whether you have hypertension, a medical professional will take a blood pressure reading. How you prepare for the test, the position of your arm, and other factors can change a blood pressure  reading by 10% or more. That could be enough to hide high blood pressure, start you on a drug you don't really need, or lead your doctor to incorrectly adjust your medications. National and international guidelines offer specific instructions for measuring blood pressure. If a doctor, nurse, or medical assistant isn't doing it right, don't hesitate to ask him or her to get with the guidelines. Here's what you can do to ensure a correct reading:  Don't drink a caffeinated beverage or smoke during the 30 minutes before the test.  Sit quietly for five minutes before the test begins.  During the measurement, sit in a chair with your feet on the floor and your arm supported so your elbow is at about heart level.  The inflatable part of the cuff should completely cover at least 80% of your upper arm, and the cuff should be placed on bare skin, not over a shirt.  Don't talk during the measurement.  Have your blood pressure measured twice, with a brief break in between. If the readings are different by 5 points or more, have it done a third time. There are times to break these rules. If you sometimes feel lightheaded when getting out of bed in the morning or when you stand after sitting, you should have your blood pressure checked while seated and then while standing to see if it falls from one position to the next. Because blood pressure varies throughout the day, your doctor will rarely diagnose hypertension on the basis of a single reading. Instead, he  or she will want to confirm the measurements on at least two occasions, usually within a few weeks of one another. The exception to this rule is if you have a blood pressure reading of 180/110 mm Hg or higher. A result this high usually calls for prompt treatment. It's also a good idea to have your blood pressure measured in both arms at least once, since the reading in one arm (usually the right) may be higher than that in the left. A 2014 study in The American  Journal of Medicine of nearly 3,400 people found average arm- to-arm differences in systolic blood pressure of about 5 points. The higher number should be used to make treatment decisions. In 2017, new guidelines from the American Heart Association, the [employer] of Cardiology, and nine other health organizations lowered the diagnosis of high blood pressure to 130/80 mm Hg or higher for all adults. The guidelines also redefined the various blood pressure categories to now include normal, elevated, Stage 1 hypertension, Stage 2 hypertension, and hypertensive crisis (see "Blood pressure categories"). Blood pressure categories  Blood pressure category SYSTOLIC (upper number)  DIASTOLIC (lower number)  Normal Less than 120 mm Hg and Less than 80 mm Hg  Elevated 120-129 mm Hg and Less than 80 mm Hg  High blood pressure: Stage 1 hypertension 130-139 mm Hg or 80-89 mm Hg  High blood pressure: Stage 2 hypertension 140 mm Hg or higher or 90 mm Hg or higher  Hypertensive crisis (consult your doctor immediately) Higher than 180 mm Hg and/or Higher than 120 mm Hg  Source: American Heart Association and American Stroke Association. For more on getting your blood pressure under control, buy Controlling Your Blood Pressure, a Special Health Report from [facility].

## 2023-05-26 ENCOUNTER — Encounter: Payer: Self-pay | Admitting: Cardiology

## 2023-05-26 LAB — COMPREHENSIVE METABOLIC PANEL
ALT: 24 [IU]/L (ref 0–44)
AST: 29 [IU]/L (ref 0–40)
Albumin: 4 g/dL (ref 3.7–4.7)
Alkaline Phosphatase: 79 [IU]/L (ref 44–121)
BUN/Creatinine Ratio: 18 (ref 10–24)
BUN: 20 mg/dL (ref 8–27)
Bilirubin Total: 0.4 mg/dL (ref 0.0–1.2)
CO2: 25 mmol/L (ref 20–29)
Calcium: 9.4 mg/dL (ref 8.6–10.2)
Chloride: 103 mmol/L (ref 96–106)
Creatinine, Ser: 1.14 mg/dL (ref 0.76–1.27)
Globulin, Total: 2.5 g/dL (ref 1.5–4.5)
Glucose: 86 mg/dL (ref 70–99)
Potassium: 4.7 mmol/L (ref 3.5–5.2)
Sodium: 138 mmol/L (ref 134–144)
Total Protein: 6.5 g/dL (ref 6.0–8.5)
eGFR: 62 mL/min/{1.73_m2} (ref >59)

## 2023-05-26 LAB — LIPID PANEL
Chol/HDL Ratio: 2.6 ratio (ref 0.0–5.0)
Cholesterol, Total: 157 mg/dL (ref 100–199)
HDL: 61 mg/dL (ref >39)
LDL Chol Calc (NIH): 79 mg/dL (ref 0–99)
Triglycerides: 89 mg/dL (ref 0–149)
VLDL Cholesterol Cal: 17 mg/dL (ref 5–40)

## 2023-06-01 DIAGNOSIS — D519 Vitamin B12 deficiency anemia, unspecified: Secondary | ICD-10-CM | POA: Diagnosis not present

## 2023-06-01 DIAGNOSIS — Z23 Encounter for immunization: Secondary | ICD-10-CM | POA: Diagnosis not present

## 2023-07-04 DIAGNOSIS — D519 Vitamin B12 deficiency anemia, unspecified: Secondary | ICD-10-CM | POA: Diagnosis not present

## 2023-08-03 DIAGNOSIS — D519 Vitamin B12 deficiency anemia, unspecified: Secondary | ICD-10-CM | POA: Diagnosis not present

## 2023-08-03 DIAGNOSIS — L820 Inflamed seborrheic keratosis: Secondary | ICD-10-CM | POA: Diagnosis not present

## 2023-08-03 DIAGNOSIS — L821 Other seborrheic keratosis: Secondary | ICD-10-CM | POA: Diagnosis not present

## 2023-08-03 DIAGNOSIS — D225 Melanocytic nevi of trunk: Secondary | ICD-10-CM | POA: Diagnosis not present

## 2023-08-03 DIAGNOSIS — D2239 Melanocytic nevi of other parts of face: Secondary | ICD-10-CM | POA: Diagnosis not present

## 2023-08-03 DIAGNOSIS — D485 Neoplasm of uncertain behavior of skin: Secondary | ICD-10-CM | POA: Diagnosis not present

## 2023-08-31 ENCOUNTER — Other Ambulatory Visit: Payer: Self-pay | Admitting: Cardiology

## 2023-08-31 DIAGNOSIS — C44619 Basal cell carcinoma of skin of left upper limb, including shoulder: Secondary | ICD-10-CM | POA: Diagnosis not present

## 2023-09-08 ENCOUNTER — Other Ambulatory Visit: Payer: Self-pay | Admitting: Cardiology

## 2023-09-12 DIAGNOSIS — D519 Vitamin B12 deficiency anemia, unspecified: Secondary | ICD-10-CM | POA: Diagnosis not present

## 2023-09-28 DIAGNOSIS — K080 Exfoliation of teeth due to systemic causes: Secondary | ICD-10-CM | POA: Diagnosis not present

## 2023-10-12 DIAGNOSIS — D519 Vitamin B12 deficiency anemia, unspecified: Secondary | ICD-10-CM | POA: Diagnosis not present

## 2023-11-18 DIAGNOSIS — E538 Deficiency of other specified B group vitamins: Secondary | ICD-10-CM | POA: Diagnosis not present

## 2023-11-21 ENCOUNTER — Ambulatory Visit: Payer: Medicare Other | Admitting: Cardiology

## 2023-11-23 DIAGNOSIS — H524 Presbyopia: Secondary | ICD-10-CM | POA: Diagnosis not present

## 2023-11-23 DIAGNOSIS — H43813 Vitreous degeneration, bilateral: Secondary | ICD-10-CM | POA: Diagnosis not present

## 2023-12-07 DIAGNOSIS — M109 Gout, unspecified: Secondary | ICD-10-CM | POA: Diagnosis not present

## 2024-01-04 ENCOUNTER — Ambulatory Visit (HOSPITAL_BASED_OUTPATIENT_CLINIC_OR_DEPARTMENT_OTHER): Admitting: Family Medicine

## 2024-01-18 DIAGNOSIS — L821 Other seborrheic keratosis: Secondary | ICD-10-CM | POA: Diagnosis not present

## 2024-01-18 DIAGNOSIS — D485 Neoplasm of uncertain behavior of skin: Secondary | ICD-10-CM | POA: Diagnosis not present

## 2024-01-18 DIAGNOSIS — D519 Vitamin B12 deficiency anemia, unspecified: Secondary | ICD-10-CM | POA: Diagnosis not present

## 2024-01-18 DIAGNOSIS — L570 Actinic keratosis: Secondary | ICD-10-CM | POA: Diagnosis not present

## 2024-01-20 ENCOUNTER — Ambulatory Visit: Admitting: Cardiology

## 2024-02-02 ENCOUNTER — Encounter (HOSPITAL_BASED_OUTPATIENT_CLINIC_OR_DEPARTMENT_OTHER): Payer: Self-pay | Admitting: Family Medicine

## 2024-02-02 ENCOUNTER — Ambulatory Visit (INDEPENDENT_AMBULATORY_CARE_PROVIDER_SITE_OTHER): Admitting: Family Medicine

## 2024-02-02 VITALS — BP 158/76 | HR 82 | Temp 98.2°F | Resp 16 | Ht 66.14 in | Wt 167.7 lb

## 2024-02-02 DIAGNOSIS — I2510 Atherosclerotic heart disease of native coronary artery without angina pectoris: Secondary | ICD-10-CM

## 2024-02-02 DIAGNOSIS — M109 Gout, unspecified: Secondary | ICD-10-CM

## 2024-02-02 DIAGNOSIS — I10 Essential (primary) hypertension: Secondary | ICD-10-CM | POA: Diagnosis not present

## 2024-02-02 DIAGNOSIS — M1A09X0 Idiopathic chronic gout, multiple sites, without tophus (tophi): Secondary | ICD-10-CM | POA: Insufficient documentation

## 2024-02-02 DIAGNOSIS — D510 Vitamin B12 deficiency anemia due to intrinsic factor deficiency: Secondary | ICD-10-CM

## 2024-02-02 DIAGNOSIS — E785 Hyperlipidemia, unspecified: Secondary | ICD-10-CM

## 2024-02-02 DIAGNOSIS — M150 Primary generalized (osteo)arthritis: Secondary | ICD-10-CM | POA: Insufficient documentation

## 2024-02-02 MED ORDER — CYANOCOBALAMIN 1000 MCG/ML IJ SOLN: 1000.0000 ug | Freq: Once | INTRAMUSCULAR | Status: AC

## 2024-02-02 NOTE — Assessment & Plan Note (Signed)
 Overall doing quite well despite his recent loss.  Await labs and f/u with him soon.  Encouraged a newer arm BP cuff and update me soon on standing BP readings.  Promptly request records from [facility] and schedule a CPE for next month.

## 2024-02-02 NOTE — Progress Notes (Signed)
 Established Patient Office Visit  Subjective   Patient ID: Ronald Dunn, male    DOB: [birth year]  Age: 89 y.o. MRN: [id number]  Chief Complaint  Patient presents with   Establish Care    Establish care     F/u as above.  Known to me from [facility].  Recently lost his wife and seems to be doing OK.  He remains busy and denies the need for additional pastoral counseling.  I'm pleased to hear that he's coping so well.  Occasional mild nonvertiginous dizziness when he stands up.  He does a good job with going to the gym regularly and doing his stretches.  States his home BP is controlled and accurate.    Past Medical History:  Diagnosis Date   CKD (chronic kidney disease) 04/14/2017   Coronary artery disease involving native coronary artery of native heart 04/14/2017   DVT (deep venous thrombosis) (HCC)    Right leg   Gout 04/14/2017   Hyperlipidemia 07/22/2015   Incomplete right bundle branch block (RBBB)    Noted on EKG 01/31/2018   PAF (paroxysmal atrial fibrillation) (HCC) 09/01/2015   f/by Dr. Monetta   Premature ventricular contractions (PVCs) (VPCs) 04/14/2017   Skin cancer (melanoma) (HCC) 04/14/2017   f/by [facility]   Venous insufficiency    Vitamin B 12 deficiency     Outpatient Encounter Medications as of 02/02/2024  Medication Sig   allopurinol  (ZYLOPRIM ) 100 MG tablet Take 100 mg by mouth 2 (two) times daily.    cyanocobalamin  (VITAMIN B12) 1000 MCG/ML injection Inject 1,000 mcg into the muscle every 30 (thirty) days.   ezetimibe  (ZETIA ) 10 MG tablet Take 1 tablet (10 mg total) by mouth daily.   Multiple Vitamin (MULTIVITAMIN ADULT) TABS Orally   Pseudoephedrine-Acetaminophen  (SM NON-ASPRIN SINUS PO) Take by mouth.   prednisoLONE acetate (PRED FORTE) 1 % ophthalmic suspension Place 1 drop into both eyes daily. (Patient not taking: Reported on 02/02/2024)   Facility-Administered Encounter Medications as of 02/02/2024  Medication   cyanocobalamin   (VITAMIN B12) injection 1,000 mcg    Social History   Tobacco Use   Smoking status: Never   Smokeless tobacco: Never  Vaping Use   Vaping status: Never Used  Substance Use Topics   Alcohol use: Never    Alcohol/week: 1.0 standard drink of alcohol    Types: 1 Glasses of wine per week   Drug use: No      Review of Systems  Constitutional:  Negative for diaphoresis, fever, malaise/fatigue and weight loss.  Respiratory:  Negative for cough, shortness of breath and wheezing.   Cardiovascular:  Negative for chest pain, palpitations, orthopnea, claudication, leg swelling and PND.      Objective:     BP (!) 158/76 (BP Location: Right Arm, Patient Position: Standing, Cuff Size: Normal)   Pulse 82   Temp 98.2 F (36.8 C) (Oral)   Resp 16   Ht 5' 6.14 (1.68 m)   Wt 167 lb 11.2 oz (76.1 kg)   SpO2 92%   BMI 26.95 kg/m    Physical Exam Constitutional:      General: He is not in acute distress.    Appearance: Normal appearance.     Comments: Lean.  Still gait, but doing well given his diffuse OA.  HENT:     Head: Normocephalic.  Neck:     Vascular: No carotid bruit.  Cardiovascular:     Rate and Rhythm: Normal rate and regular rhythm.  Pulses: Normal pulses.     Heart sounds: Normal heart sounds.  Pulmonary:     Effort: Pulmonary effort is normal.     Breath sounds: Normal breath sounds.  Abdominal:     General: Bowel sounds are normal.     Palpations: Abdomen is soft.  Musculoskeletal:     Cervical back: Neck supple. No tenderness.     Right lower leg: No edema.     Left lower leg: No edema.  Neurological:     Mental Status: He is alert.      No results found for any visits on 02/02/24.    The ASCVD Risk score (Arnett DK, et al., 2019) failed to calculate for the following reasons:   The 2019 ASCVD risk score is only valid for ages 64 to 101    Assessment & Plan:  Coronary artery disease involving native coronary artery of native heart without  angina pectoris Assessment & Plan: Overall doing quite well despite his recent loss.  Await labs and f/u with him soon.  Encouraged a newer arm BP cuff and update me soon on standing BP readings.  Promptly request records from [facility] and schedule a CPE for next month.  Orders: -     CBC with Differential/Platelet -     Comprehensive metabolic panel with GFR  Essential hypertension  Gout, unspecified cause, unspecified chronicity, unspecified site -     Uric acid  Dyslipidemia -     Lipid panel  Pernicious anemia -     Cyanocobalamin     Return in about 4 weeks (around 03/01/2024) for physical.    DOTTIE PONCE NORLEEN FALCON., MD

## 2024-02-03 ENCOUNTER — Ambulatory Visit (HOSPITAL_BASED_OUTPATIENT_CLINIC_OR_DEPARTMENT_OTHER): Payer: Self-pay | Admitting: Family Medicine

## 2024-02-03 LAB — LIPID PANEL
Chol/HDL Ratio: 2.3 ratio (ref 0.0–5.0)
Cholesterol, Total: 150 mg/dL (ref 100–199)
HDL: 65 mg/dL (ref >39)
LDL Chol Calc (NIH): 72 mg/dL (ref 0–99)
Triglycerides: 64 mg/dL (ref 0–149)
VLDL Cholesterol Cal: 13 mg/dL (ref 5–40)

## 2024-02-03 LAB — COMPREHENSIVE METABOLIC PANEL WITH GFR
ALT: 20 IU/L (ref 0–44)
AST: 24 IU/L (ref 0–40)
Albumin: 3.9 g/dL (ref 3.7–4.7)
Alkaline Phosphatase: 95 IU/L (ref 44–121)
BUN/Creatinine Ratio: 17 (ref 10–24)
BUN: 21 mg/dL (ref 8–27)
Bilirubin Total: 0.6 mg/dL (ref 0.0–1.2)
CO2: 21 mmol/L (ref 20–29)
Calcium: 9.2 mg/dL (ref 8.6–10.2)
Chloride: 103 mmol/L (ref 96–106)
Creatinine, Ser: 1.22 mg/dL (ref 0.76–1.27)
Globulin, Total: 2.7 g/dL (ref 1.5–4.5)
Glucose: 99 mg/dL (ref 70–99)
Potassium: 4.6 mmol/L (ref 3.5–5.2)
Sodium: 137 mmol/L (ref 134–144)
Total Protein: 6.6 g/dL (ref 6.0–8.5)
eGFR: 57 mL/min/1.73 — ABNORMAL LOW (ref >59)

## 2024-02-03 LAB — CBC WITH DIFFERENTIAL/PLATELET
Basophils Absolute: 0.1 x10E3/uL (ref 0.0–0.2)
Basos: 1 %
EOS (ABSOLUTE): 0.1 x10E3/uL (ref 0.0–0.4)
Eos: 1 %
Hematocrit: 40.3 % (ref 37.5–51.0)
Hemoglobin: 12.9 g/dL — ABNORMAL LOW (ref 13.0–17.7)
Immature Grans (Abs): 0 x10E3/uL (ref 0.0–0.1)
Immature Granulocytes: 0 %
Lymphocytes Absolute: 1.4 x10E3/uL (ref 0.7–3.1)
Lymphs: 14 %
MCH: 30.9 pg (ref 26.6–33.0)
MCHC: 32 g/dL (ref 31.5–35.7)
MCV: 97 fL (ref 79–97)
Monocytes Absolute: 0.8 x10E3/uL (ref 0.1–0.9)
Monocytes: 8 %
Neutrophils Absolute: 7.7 x10E3/uL — ABNORMAL HIGH (ref 1.4–7.0)
Neutrophils: 76 %
Platelets: 170 x10E3/uL (ref 150–450)
RBC: 4.17 x10E6/uL (ref 4.14–5.80)
RDW: 13.4 % (ref 11.6–15.4)
WBC: 10.1 x10E3/uL (ref 3.4–10.8)

## 2024-02-03 LAB — URIC ACID: Uric Acid: 5.7 mg/dL (ref 3.8–8.4)

## 2024-02-06 ENCOUNTER — Telehealth (HOSPITAL_BASED_OUTPATIENT_CLINIC_OR_DEPARTMENT_OTHER): Payer: Self-pay | Admitting: Family Medicine

## 2024-02-06 NOTE — Telephone Encounter (Signed)
 Copied from CRM [id number]. Topic: Clinical - Lab/Test Results >> Feb 06, 2024  2:31 PM Fonda T wrote: Reason for CRM: Patient states he is returning a call to [place]. Requesting to speak directly to office as soon as possible.  Called and spoke to front office, [place], states above medical assistant has left for the day, but will return the call tomorrow.  Sending CRM to office for a return call.  Patient can be reached at [phone].

## 2024-02-10 ENCOUNTER — Ambulatory Visit (HOSPITAL_BASED_OUTPATIENT_CLINIC_OR_DEPARTMENT_OTHER): Admitting: *Deleted

## 2024-02-10 ENCOUNTER — Ambulatory Visit (HOSPITAL_BASED_OUTPATIENT_CLINIC_OR_DEPARTMENT_OTHER): Admitting: Family Medicine

## 2024-02-10 ENCOUNTER — Other Ambulatory Visit (HOSPITAL_BASED_OUTPATIENT_CLINIC_OR_DEPARTMENT_OTHER)

## 2024-02-10 DIAGNOSIS — D510 Vitamin B12 deficiency anemia due to intrinsic factor deficiency: Secondary | ICD-10-CM

## 2024-02-10 MED ORDER — CYANOCOBALAMIN 1000 MCG/ML IJ SOLN
1000.0000 ug | Freq: Once | INTRAMUSCULAR | Status: AC
  Administered 2024-02-10: 1000 ug via INTRAMUSCULAR

## 2024-03-01 ENCOUNTER — Ambulatory Visit (INDEPENDENT_AMBULATORY_CARE_PROVIDER_SITE_OTHER): Admitting: Family Medicine

## 2024-03-01 ENCOUNTER — Encounter (HOSPITAL_BASED_OUTPATIENT_CLINIC_OR_DEPARTMENT_OTHER): Payer: Self-pay | Admitting: Family Medicine

## 2024-03-01 ENCOUNTER — Other Ambulatory Visit (HOSPITAL_BASED_OUTPATIENT_CLINIC_OR_DEPARTMENT_OTHER): Payer: Self-pay | Admitting: Family Medicine

## 2024-03-01 VITALS — BP 139/75 | HR 96 | Temp 98.4°F | Resp 16 | Wt 174.7 lb

## 2024-03-01 DIAGNOSIS — R5382 Chronic fatigue, unspecified: Secondary | ICD-10-CM | POA: Diagnosis not present

## 2024-03-01 DIAGNOSIS — M1A09X0 Idiopathic chronic gout, multiple sites, without tophus (tophi): Secondary | ICD-10-CM

## 2024-03-01 DIAGNOSIS — R600 Localized edema: Secondary | ICD-10-CM

## 2024-03-01 DIAGNOSIS — D649 Anemia, unspecified: Secondary | ICD-10-CM | POA: Diagnosis not present

## 2024-03-01 DIAGNOSIS — R5383 Other fatigue: Secondary | ICD-10-CM | POA: Insufficient documentation

## 2024-03-01 DIAGNOSIS — M1991 Primary osteoarthritis, unspecified site: Secondary | ICD-10-CM | POA: Diagnosis not present

## 2024-03-01 DIAGNOSIS — M1A9XX0 Chronic gout, unspecified, without tophus (tophi): Secondary | ICD-10-CM | POA: Insufficient documentation

## 2024-03-01 MED ORDER — TRAMADOL HCL 50 MG PO TABS: 50.0000 mg | ORAL_TABLET | Freq: Two times a day (BID) | ORAL | 0 refills | Status: AC

## 2024-03-01 MED ORDER — ALLOPURINOL 100 MG PO TABS: 100.0000 mg | ORAL_TABLET | Freq: Two times a day (BID) | ORAL | 1 refills | Status: AC

## 2024-03-01 NOTE — Assessment & Plan Note (Addendum)
 Unclear etiology.  Await labs and f/u with him soon.  Try to avoid NSAIDS in lay terms.

## 2024-03-01 NOTE — Progress Notes (Signed)
 Established Patient Office Visit  Subjective   Patient ID: Ronald Dunn, male    DOB: 12-13-1931  Age: 89 y.o. MRN: [id number]  Chief Complaint  Patient presents with   Annual Exam    Annual exam     F/u as above.  Do to his multiple concerns today, we changed this to a problem visit.  Continues to recover after recently losing his wife.  He reports considerable morning stiffness in his lower back with radiations down both lower extremities.  He takes an occasional Aleve which helps some.  Some frustrations with fatigue.  Pain doesn't really ease off later in the day.  He remains active, and understands its important not to overdo it.  He seems to sleep pretty well and takes few naps.  He is compliant with his B12 shots.  Reports minimal salt intake.  Admits he could do better with elevating his legs in the evening.    Past Medical History:  Diagnosis Date   CKD (chronic kidney disease) 04/14/2017   Coronary artery disease involving native coronary artery of native heart 04/14/2017   DVT (deep venous thrombosis) (HCC)    Right leg   Gout 04/14/2017   Hyperlipidemia 07/22/2015   Incomplete right bundle branch block (RBBB)    Noted on EKG 01/31/2018   Osteoarthritis    PAF (paroxysmal atrial fibrillation) (HCC) 09/01/2015   f/by Dr. Monetta   Premature ventricular contractions (PVCs) (VPCs) 04/14/2017   Skin cancer (melanoma) (HCC) 04/14/2017   f/by [facility]   Venous insufficiency    Vitamin B 12 deficiency     Outpatient Encounter Medications as of 03/01/2024  Medication Sig   cyanocobalamin  (VITAMIN B12) 1000 MCG/ML injection Inject 1,000 mcg into the muscle every 30 (thirty) days.   ezetimibe  (ZETIA ) 10 MG tablet Take 1 tablet (10 mg total) by mouth daily.   Multiple Vitamin (MULTIVITAMIN ADULT) TABS Orally   Pseudoephedrine-Acetaminophen  (SM NON-ASPRIN SINUS PO) Take by mouth.   traMADol  (ULTRAM ) 50 MG tablet Take 1 tablet (50 mg total) by mouth 2 (two) times  daily for 5 days.   [DISCONTINUED] allopurinol  (ZYLOPRIM ) 100 MG tablet Take 100 mg by mouth 2 (two) times daily.    prednisoLONE acetate (PRED FORTE) 1 % ophthalmic suspension Place 1 drop into both eyes daily. (Patient not taking: Reported on 03/01/2024)   Facility-Administered Encounter Medications as of 03/01/2024  Medication   cyanocobalamin  (VITAMIN B12) injection 1,000 mcg    Social History   Tobacco Use   Smoking status: Never   Smokeless tobacco: Never  Vaping Use   Vaping status: Never Used  Substance Use Topics   Alcohol use: Never    Alcohol/week: 1.0 standard drink of alcohol    Types: 1 Glasses of wine per week   Drug use: No      Review of Systems  Constitutional:  Negative for diaphoresis, fever, malaise/fatigue and weight loss.  Respiratory:  Negative for cough, shortness of breath and wheezing.   Cardiovascular:  Positive for leg swelling. Negative for chest pain, palpitations, orthopnea, claudication and PND.  Musculoskeletal:  Positive for back pain, joint pain and myalgias.      Objective:     BP 139/75 (BP Location: Right Arm, Patient Position: Standing, Cuff Size: Normal)   Pulse 96   Temp 98.4 F (36.9 C) (Oral)   Resp 16   Wt 174 lb 11.2 oz (79.2 kg)   SpO2 96%   BMI 28.08 kg/m    Physical Exam  Constitutional:      General: He is not in acute distress.    Appearance: Normal appearance.  HENT:     Head: Normocephalic.  Neck:     Vascular: No carotid bruit.  Cardiovascular:     Rate and Rhythm: Normal rate and regular rhythm.     Pulses: Normal pulses.     Comments: 2/6 SEM audible Pulmonary:     Effort: Pulmonary effort is normal.     Breath sounds: Normal breath sounds.  Abdominal:     General: Bowel sounds are normal.     Palpations: Abdomen is soft.  Musculoskeletal:     Cervical back: Neck supple. No tenderness.     Right lower leg: Edema present.     Left lower leg: Edema present.     Comments: Mild lumbago.  2-3+ bilateral  LE edema.  Minimal stasis dermatitis.  1+ pedal pulses.  Neurological:     Mental Status: He is alert.      No results found for any visits on 03/01/24.    The ASCVD Risk score (Arnett DK, et al., 2019) failed to calculate for the following reasons:   The 2019 ASCVD risk score is only valid for ages 38 to 58    Assessment & Plan:  Chronic fatigue Assessment & Plan: Unclear etiology.  Await labs and f/u with him soon.  Try to avoid NSAIDS in lay terms.  Orders: -     TSH  Lower extremity edema Assessment & Plan: DASH diet.  Postural changes.  I urged properly fitted support stockings.  He declines a diuretic for now.   Primary osteoarthritis, unspecified site -     traMADol  HCl; Take 1 tablet (50 mg total) by mouth 2 (two) times daily for 5 days.  Dispense: 30 tablet; Refill: 0  Anemia, unspecified type Assessment & Plan: Possible anemia of chronic disease, but will workup further.  Orders: -     Iron, TIBC and Ferritin Panel -     Protein electrophoresis, serum -     CBC with Differential/Platelet    Return in about 4 weeks (around 03/29/2024) for chronic follow-up.    REDDING PONCE NORLEEN FALCON., MD

## 2024-03-01 NOTE — Assessment & Plan Note (Signed)
 DASH diet.  Postural changes.  I urged properly fitted support stockings.  He declines a diuretic for now.

## 2024-03-01 NOTE — Progress Notes (Unsigned)
   Established Patient Office Visit  Subjective   Patient ID: Ronald Dunn, male    DOB: [birth year]  Age: 89 y.o. MRN: [id number]  No chief complaint on file.   HPI  Past Medical History:  Diagnosis Date   CKD (chronic kidney disease) 04/14/2017   Coronary artery disease involving native coronary artery of native heart 04/14/2017   DVT (deep venous thrombosis) (HCC)    Right leg   Gout 04/14/2017   Hyperlipidemia 07/22/2015   Incomplete right bundle branch block (RBBB)    Noted on EKG 01/31/2018   PAF (paroxysmal atrial fibrillation) (HCC) 09/01/2015   f/by Dr. Monetta   Premature ventricular contractions (PVCs) (VPCs) 04/14/2017   Skin cancer (melanoma) (HCC) 04/14/2017   f/by [facility]   Venous insufficiency    Vitamin B 12 deficiency     Outpatient Encounter Medications as of 03/01/2024  Medication Sig   allopurinol  (ZYLOPRIM ) 100 MG tablet Take 1 tablet (100 mg total) by mouth 2 (two) times daily.   cyanocobalamin  (VITAMIN B12) 1000 MCG/ML injection Inject 1,000 mcg into the muscle every 30 (thirty) days.   ezetimibe  (ZETIA ) 10 MG tablet Take 1 tablet (10 mg total) by mouth daily.   Multiple Vitamin (MULTIVITAMIN ADULT) TABS Orally   prednisoLONE acetate (PRED FORTE) 1 % ophthalmic suspension Place 1 drop into both eyes daily. (Patient not taking: Reported on 03/01/2024)   Pseudoephedrine-Acetaminophen  (SM NON-ASPRIN SINUS PO) Take by mouth.   traMADol  (ULTRAM ) 50 MG tablet Take 1 tablet (50 mg total) by mouth 2 (two) times daily for 5 days.   [DISCONTINUED] allopurinol  (ZYLOPRIM ) 100 MG tablet Take 100 mg by mouth 2 (two) times daily.    Facility-Administered Encounter Medications as of 03/01/2024  Medication   cyanocobalamin  (VITAMIN B12) injection 1,000 mcg    Social History   Tobacco Use   Smoking status: Never   Smokeless tobacco: Never  Vaping Use   Vaping status: Never Used  Substance Use Topics   Alcohol use: Never    Alcohol/week: 1.0 standard  drink of alcohol    Types: 1 Glasses of wine per week   Drug use: No    {History (Optional):23778}  ROS    Objective:     There were no vitals taken for this visit. {Vitals History (Optional):23777}  Physical Exam   No results found for any visits on 03/01/24.  {Labs (Optional):23779}  The ASCVD Risk score (Arnett DK, et al., 2019) failed to calculate for the following reasons:   The 2019 ASCVD risk score is only valid for ages 51 to 41    Assessment & Plan:  Chronic gout of multiple sites, unspecified cause -     Allopurinol ; Take 1 tablet (100 mg total) by mouth 2 (two) times daily.  Dispense: 180 tablet; Refill: 1    No follow-ups on file.    REDDING PONCE NORLEEN FALCON., MD

## 2024-03-01 NOTE — Assessment & Plan Note (Signed)
 Possible anemia of chronic disease, but will workup further.

## 2024-03-04 NOTE — Progress Notes (Unsigned)
 Cardiology Office Note:    Date:  03/05/2024   ID:  Ronald Dunn, DOB 1931-11-21, MRN [id number]  PCP:  Ronald Norleen PHEBE PONCE, MD  Cardiologist:  Ronald Leiter, MD    Referring MD: Ronald Dene BROCKS, DO    ASSESSMENT:    1. Coronary artery disease, unspecified vessel or lesion type, unspecified whether angina present, unspecified whether native or transplanted heart   2. Essential hypertension   3. Chronic kidney disease, unspecified CKD stage   4. Mixed hyperlipidemia   5. RBBB with left anterior fascicular block   6. Venous insufficiency    PLAN:    In order of problems listed above:  Ronald Dunn continues to do well with CAD having no anginal discomfort on continue his medical therapy including aspirin  and lipid-lowering with nonstatin he is statin intolerant Stable hypertension currently on no antihypertensive agent Stable CKD He will continue his current treatment his lipids are at target he is statin intolerant Worsened he can use furosemide  as needed I asked him to please be careful and limit as little as possible   Next appointment: 1 year   Medication Adjustments/Labs and Tests Ordered: Current medicines are reviewed at length with the patient today.  Concerns regarding medicines are outlined above.  Orders Placed This Encounter  Procedures   EKG 12-Lead   Meds ordered this encounter  Medications   aspirin  EC 81 MG tablet    Sig: Take 1 tablet (81 mg total) by mouth daily. Swallow whole.    Dispense:  90 tablet    Refill:  3   furosemide  (LASIX ) 20 MG tablet    Sig: Please take this medication two times weekly as needed    Dispense:  45 tablet    Refill:  3     History of Present Illness:    Ronald Dunn is a 89 y.o. male with a hx of CAD with CABG 2017 hypertension with CKD bifascicular heart block hyperlipidemia chronic venous insufficiency and lymphedema last seen 05/25/23.  Compliance with diet, lifestyle and medications: Yes, although not listed he  does take coated aspirin  81 mg daily that we will add to his medications He tolerates Zetia  without muscle pain or weakness and his recent lipids are at target For cardiac perspective he is doing well no angina shortness of breath palpitation or syncope but he finds when he is on his feet more during the day and not elevating is much he has worsened edema He was advised to take a diuretic asked me and I gave him prescription for furosemide  20 mg that he can take at the most 2 days a week He has been previously evaluated by vascular surgery for his chronic venous insufficiency His EKG shows a stable pattern bifascicular heart block Past Medical History:  Diagnosis Date   CKD (chronic kidney disease) 04/14/2017   Coronary artery disease involving native coronary artery of native heart 04/14/2017   DVT (deep venous thrombosis) (HCC)    Right leg   Gout 04/14/2017   Hyperlipidemia 07/22/2015   Incomplete right bundle branch block (RBBB)    Noted on EKG 01/31/2018   Osteoarthritis    PAF (paroxysmal atrial fibrillation) (HCC) 09/01/2015   f/by Dr. Leiter   Premature ventricular contractions (PVCs) (VPCs) 04/14/2017   Skin cancer (melanoma) (HCC) 04/14/2017   f/by [facility]   Venous insufficiency    Vitamin B 12 deficiency     Current Medications: Current Meds  Medication Sig   allopurinol  (ZYLOPRIM ) 100  MG tablet Take 1 tablet (100 mg total) by mouth 2 (two) times daily.   aspirin  EC 81 MG tablet Take 1 tablet (81 mg total) by mouth daily. Swallow whole.   cyanocobalamin  (VITAMIN B12) 1000 MCG/ML injection Inject 1,000 mcg into the muscle every 30 (thirty) days.   ezetimibe  (ZETIA ) 10 MG tablet Take 1 tablet (10 mg total) by mouth daily.   furosemide  (LASIX ) 20 MG tablet Please take this medication two times weekly as needed   Multiple Vitamin (MULTIVITAMIN ADULT) TABS Orally   prednisoLONE acetate (PRED FORTE) 1 % ophthalmic suspension Place 1 drop into both eyes daily.    Pseudoephedrine-Acetaminophen  (SM NON-ASPRIN SINUS PO) Take by mouth.   traMADol  (ULTRAM ) 50 MG tablet Take 1 tablet (50 mg total) by mouth 2 (two) times daily for 5 days.   Current Facility-Administered Medications for the 03/05/24 encounter (Office Visit) with Ronald Ronald PARAS, MD  Medication   cyanocobalamin  (VITAMIN B12) injection 1,000 mcg      EKGs/Labs/Other Studies Reviewed:    The following studies were reviewed today:  Cardiac Studies & Procedures   ______________________________________________________________________________________________     ECHOCARDIOGRAM  ECHOCARDIOGRAM COMPLETE 12/12/2020  Narrative ECHOCARDIOGRAM REPORT    Patient Name:   Ronald Dunn Date of Exam: 12/12/2020 Medical Rec #:  [id number]          Height:       69.0 in Accession #:    [id number]         Weight:       167.0 lb Date of Birth:  13-Mar-1932         BSA:          1.914 m Patient Age:    89 years           BP:           126/64 mmHg Patient Gender: M                  HR:           72 bpm. Exam Location:  [facility]  Procedure: 2D Echo  Indications:    Bilateral leg edema [R60.0 (ICD-10-CM)]  History:        Patient has no prior history of Echocardiogram examinations. Prior CABG, Arrythmias:Atrial Fibrillation; Risk Factors:Hypertension.  Sonographer:    Ronald Dunn Referring Phys: [phone] Ronald Dunn  IMPRESSIONS   1. Left ventricular ejection fraction, by estimation, is 60 to 65%. The left ventricle has normal function. The left ventricle has no regional wall motion abnormalities. There is mild left ventricular hypertrophy. 2. Right ventricular systolic function is normal. The right ventricular size is normal. There is normal pulmonary artery systolic pressure. 3. The mitral valve is normal in structure. Mild mitral valve regurgitation. No evidence of mitral stenosis. 4. The aortic valve is normal in structure. Aortic valve regurgitation is not visualized. Mild aortic  valve sclerosis is present, with no evidence of aortic valve stenosis. 5. Descending aorta 2.3 cm. 6. The inferior vena cava is normal in size with greater than 50% respiratory variability, suggesting right atrial pressure of 3 mmHg.  FINDINGS Left Ventricle: Left ventricular ejection fraction, by estimation, is 60 to 65%. The left ventricle has normal function. The left ventricle has no regional wall motion abnormalities. The left ventricular internal cavity size was normal in size. There is mild left ventricular hypertrophy. Left ventricular diastolic parameters were normal.  Right Ventricle: The right ventricular size is normal. No increase in right ventricular wall  thickness. Right ventricular systolic function is normal. There is normal pulmonary artery systolic pressure. The tricuspid regurgitant velocity is 2.73 m/s, and with an assumed right atrial pressure of 3 mmHg, the estimated right ventricular systolic pressure is 32.8 mmHg.  Left Atrium: Left atrial size was normal in size.  Right Atrium: Right atrial size was normal in size.  Pericardium: There is no evidence of pericardial effusion.  Mitral Valve: The mitral valve is normal in structure. Mild mitral valve regurgitation. No evidence of mitral valve stenosis.  Tricuspid Valve: The tricuspid valve is normal in structure. Tricuspid valve regurgitation is mild . No evidence of tricuspid stenosis.  Aortic Valve: The aortic valve is normal in structure. Aortic valve regurgitation is not visualized. Mild aortic valve sclerosis is present, with no evidence of aortic valve stenosis.  Pulmonic Valve: The pulmonic valve was normal in structure. Pulmonic valve regurgitation is not visualized. No evidence of pulmonic stenosis.  Aorta: Descending aorta 2.3 cm. The aortic root is normal in size and structure.  Venous: The inferior vena cava is normal in size with greater than 50% respiratory variability, suggesting right atrial pressure  of 3 mmHg.  IAS/Shunts: No atrial level shunt detected by color flow Doppler.   LEFT VENTRICLE PLAX 2D LVIDd:         4.80 cm  Diastology LVIDs:         2.90 cm  LV e' medial:    7.29 cm/s LV PW:         1.30 cm  LV E/e' medial:  14.1 LV IVS:        1.30 cm  LV e' lateral:   11.90 cm/s LVOT diam:     1.90 cm  LV E/e' lateral: 8.7 LV SV:         43 LV SV Index:   23 LVOT Area:     2.84 cm   RIGHT VENTRICLE            IVC RV S prime:     6.31 cm/s  IVC diam: 1.30 cm TAPSE (M-mode): 2.3 cm  LEFT ATRIUM             Index       RIGHT ATRIUM           Index LA diam:        3.00 cm 1.57 cm/m  RA Area:     17.00 cm LA Vol (A2C):   56.8 ml 29.68 ml/m RA Volume:   42.80 ml  22.37 ml/m LA Vol (A4C):   39.6 ml 20.69 ml/m LA Biplane Vol: 51.6 ml 26.97 ml/m AORTIC VALVE LVOT Vmax:   77.70 cm/s LVOT Vmean:  50.300 cm/s LVOT VTI:    0.152 m  AORTA Ao Root diam: 3.50 cm Ao Asc diam:  3.50 cm Ao Desc diam: 2.30 cm  MITRAL VALVE                TRICUSPID VALVE MV Area (PHT): 3.34 cm     TR Peak grad:   29.8 mmHg MV Decel Time: 227 msec     TR Vmax:        273.00 cm/s MV E velocity: 103.00 cm/s MV A velocity: 72.80 cm/s   SHUNTS MV E/A ratio:  1.41         Systemic VTI:  0.15 m Systemic Diam: 1.90 cm  Jennifer Crape MD Electronically signed by Jennifer Crape MD Signature Date/Time: 12/12/2020/12:24:32 PM    Final    SHERRILEE  LONG TERM MONITOR (3-14 DAYS) 09/22/2022  Narrative Patch Wear Time:  8 days and 8 hours (2024-02-09T11:13:26-0500 to 2024-02-17T19:25:27-0500)  Patient had a min HR of 36 bpm, max HR of 214 bpm, and avg HR of 78 bpm. Predominant underlying rhythm was Sinus Rhythm. First Degree AV Block was present. Bundle Branch Block/IVCD was present.  10 Ventricular Tachycardia runs occurred, the run with the fastest interval lasting 4 beats with a max rate of 214 bpm, the longest lasting 9 beats with an avg rate of 101 bpm.  44 Supraventricular Tachycardia  runs occurred, the run with the fastest interval lasting 4 mins 43 secs with a max rate of 176 bpm (avg 152 bpm); the run with the fastest interval was also the longest.  Second Degree AV Block-Mobitz I (Wenckebach) was present.  Isolated SVEs were occasional (1.1%, 9383), SVE Couplets were rare (<1.0%, 321), and SVE Triplets were rare (<1.0%, 83).  Isolated VEs were occasional (1.0%, 8220), VE Couplets were rare (<1.0%, 423), and VE Triplets were rare (<1.0%, 22). Ventricular Bigeminy and Trigeminy were present.       ______________________________________________________________________________________________      EKG Interpretation Date/Time:  Monday March 05 2024 11:24:16 EDT Ventricular Rate:  75 PR Interval:  126 QRS Duration:  156 QT Interval:  430 QTC Calculation: 480 R Axis:   -66  Text Interpretation: Sinus rhythm Right bundle branch block Left anterior fascicular block Bifascicular block ) No previous ECGs available Confirmed by Ronald Rogue ([id number]) on 03/05/2024 11:33:30 AM   Recent Labs: 02/02/2024: ALT 20; BUN 21; Creatinine, Ser 1.22; Hemoglobin 12.9; Platelets 170; Potassium 4.6; Sodium 137  Recent Lipid Panel    Component Value Date/Time   CHOL 150 02/02/2024 0000   TRIG 64 02/02/2024 0000   HDL 65 02/02/2024 0000   CHOLHDL 2.3 02/02/2024 0000   LDLCALC 72 02/02/2024 0000    Physical Exam:    VS:  BP 104/62   Pulse 75   Ht 5' 6.14 (1.68 m)   Wt 175 lb (79.4 kg)   SpO2 97%   BMI 28.13 kg/m     Wt Readings from Last 3 Encounters:  03/05/24 175 lb (79.4 kg)  03/01/24 174 lb 11.2 oz (79.2 kg)  02/02/24 167 lb 11.2 oz (76.1 kg)     GEN:  Well nourished, well developed in no acute distress HEENT: Normal NECK: No JVD; No carotid bruits LYMPHATICS: No lymphadenopathy CARDIAC: RRR, no murmurs, rubs, gallops RESPIRATORY:  Clear to auscultation without rales, wheezing or rhonchi  ABDOMEN: Soft, non-tender, non-distended MUSCULOSKELETAL: He has  marked diffuse lower extremity edema above the knee worse distally with stasis changes edema; No deformity  SKIN: Warm and dry NEUROLOGIC:  Alert and oriented x 3 PSYCHIATRIC:  Normal affect    Signed, Rogue Monetta, MD  03/05/2024 12:10 PM     [facility]

## 2024-03-05 ENCOUNTER — Encounter: Payer: Self-pay | Admitting: Cardiology

## 2024-03-05 ENCOUNTER — Ambulatory Visit: Attending: Cardiology | Admitting: Cardiology

## 2024-03-05 VITALS — BP 104/62 | HR 75 | Ht 66.14 in | Wt 175.0 lb

## 2024-03-05 DIAGNOSIS — N189 Chronic kidney disease, unspecified: Secondary | ICD-10-CM

## 2024-03-05 DIAGNOSIS — I452 Bifascicular block: Secondary | ICD-10-CM

## 2024-03-05 DIAGNOSIS — I10 Essential (primary) hypertension: Secondary | ICD-10-CM

## 2024-03-05 DIAGNOSIS — N183 Chronic kidney disease, stage 3 unspecified: Secondary | ICD-10-CM | POA: Insufficient documentation

## 2024-03-05 DIAGNOSIS — I2510 Atherosclerotic heart disease of native coronary artery without angina pectoris: Secondary | ICD-10-CM | POA: Diagnosis not present

## 2024-03-05 DIAGNOSIS — E782 Mixed hyperlipidemia: Secondary | ICD-10-CM

## 2024-03-05 DIAGNOSIS — I872 Venous insufficiency (chronic) (peripheral): Secondary | ICD-10-CM

## 2024-03-05 LAB — CBC WITH DIFFERENTIAL/PLATELET
Basophils Absolute: 0.1 x10E3/uL (ref 0.0–0.2)
Basos: 1 %
EOS (ABSOLUTE): 0.2 x10E3/uL (ref 0.0–0.4)
Eos: 2 %
Hematocrit: 37.2 % — ABNORMAL LOW (ref 37.5–51.0)
Hemoglobin: 11.9 g/dL — ABNORMAL LOW (ref 13.0–17.7)
Immature Grans (Abs): 0.2 x10E3/uL — ABNORMAL HIGH (ref 0.0–0.1)
Immature Granulocytes: 1 %
Lymphocytes Absolute: 1.5 x10E3/uL (ref 0.7–3.1)
Lymphs: 11 %
MCH: 31.2 pg (ref 26.6–33.0)
MCHC: 32 g/dL (ref 31.5–35.7)
MCV: 98 fL — ABNORMAL HIGH (ref 79–97)
Monocytes Absolute: 1 x10E3/uL — ABNORMAL HIGH (ref 0.1–0.9)
Monocytes: 8 %
Neutrophils Absolute: 10 x10E3/uL — ABNORMAL HIGH (ref 1.4–7.0)
Neutrophils: 77 %
Platelets: 330 x10E3/uL (ref 150–450)
RBC: 3.81 x10E6/uL — ABNORMAL LOW (ref 4.14–5.80)
RDW: 13.2 % (ref 11.6–15.4)
WBC: 12.9 x10E3/uL — ABNORMAL HIGH (ref 3.4–10.8)

## 2024-03-05 LAB — PROTEIN ELECTROPHORESIS, SERUM
A/G Ratio: 0.7 (ref 0.7–1.7)
Albumin ELP: 2.7 g/dL — ABNORMAL LOW (ref 2.9–4.4)
Alpha 1: 0.2 g/dL (ref 0.0–0.4)
Alpha 2: 0.7 g/dL (ref 0.4–1.0)
Beta: 1.2 g/dL (ref 0.7–1.3)
Gamma Globulin: 1.6 g/dL (ref 0.4–1.8)
Globulin, Total: 3.8 g/dL (ref 2.2–3.9)
Total Protein: 6.5 g/dL (ref 6.0–8.5)

## 2024-03-05 LAB — IRON,TIBC AND FERRITIN PANEL
Ferritin: 174 ng/mL (ref 30–400)
Iron Saturation: 11 % — ABNORMAL LOW (ref 15–55)
Iron: 28 ug/dL — ABNORMAL LOW (ref 38–169)
Total Iron Binding Capacity: 265 ug/dL (ref 250–450)
UIBC: 237 ug/dL (ref 111–343)

## 2024-03-05 LAB — TSH: TSH: 1.79 u[IU]/mL (ref 0.450–4.500)

## 2024-03-05 MED ORDER — ASPIRIN 81 MG PO TBEC: 81.0000 mg | DELAYED_RELEASE_TABLET | Freq: Every day | ORAL | 3 refills | Status: AC

## 2024-03-05 MED ORDER — FUROSEMIDE 20 MG PO TABS: ORAL_TABLET | ORAL | 3 refills | Status: DC

## 2024-03-05 NOTE — Patient Instructions (Signed)
 Medication Instructions:  Your physician has recommended you make the following change in your medication:   START: Aspirin 81 mg daily START: Furosemide 20 mg twice a week as needed  *If you need a refill on your cardiac medications before your next appointment, please call your pharmacy*  Lab Work: None If you have labs (blood work) drawn today and your tests are completely normal, you will receive your results only by: MyChart Message (if you have MyChart) OR A paper copy in the mail If you have any lab test that is abnormal or we need to change your treatment, we will call you to review the results.  Testing/Procedures: None  Follow-Up: At [facility], you and your health needs are our priority.  As part of our continuing mission to provide you with exceptional heart care, our providers are all part of one team.  This team includes your primary Cardiologist (physician) and Advanced Practice Providers or APPs (Physician Assistants and Nurse Practitioners) who all work together to provide you with the care you need, when you need it.  Your next appointment:   9 month(s)  Provider:   Redell Leiter, MD    We recommend signing up for the patient portal called MyChart.  Sign up information is provided on this After Visit Summary.  MyChart is used to connect with patients for Virtual Visits (Telemedicine).  Patients are able to view lab/test results, encounter notes, upcoming appointments, etc.  Non-urgent messages can be sent to your provider as well.   To learn more about what you can do with MyChart, go to [email or web address].   Other Instructions None

## 2024-03-12 ENCOUNTER — Ambulatory Visit (HOSPITAL_BASED_OUTPATIENT_CLINIC_OR_DEPARTMENT_OTHER): Admitting: *Deleted

## 2024-03-12 DIAGNOSIS — D510 Vitamin B12 deficiency anemia due to intrinsic factor deficiency: Secondary | ICD-10-CM | POA: Diagnosis not present

## 2024-03-12 MED ORDER — CYANOCOBALAMIN 1000 MCG/ML IJ SOLN
1000.0000 ug | Freq: Once | INTRAMUSCULAR | Status: AC
  Administered 2024-03-12: 1000 ug via INTRAMUSCULAR

## 2024-03-29 ENCOUNTER — Ambulatory Visit (HOSPITAL_BASED_OUTPATIENT_CLINIC_OR_DEPARTMENT_OTHER): Admitting: Family Medicine

## 2024-03-29 ENCOUNTER — Encounter (HOSPITAL_BASED_OUTPATIENT_CLINIC_OR_DEPARTMENT_OTHER): Payer: Self-pay | Admitting: Family Medicine

## 2024-03-29 VITALS — BP 131/76 | HR 90 | Temp 98.0°F | Resp 16 | Wt 166.4 lb

## 2024-03-29 DIAGNOSIS — I872 Venous insufficiency (chronic) (peripheral): Secondary | ICD-10-CM | POA: Diagnosis not present

## 2024-03-29 DIAGNOSIS — N400 Enlarged prostate without lower urinary tract symptoms: Secondary | ICD-10-CM | POA: Insufficient documentation

## 2024-03-29 DIAGNOSIS — D649 Anemia, unspecified: Secondary | ICD-10-CM

## 2024-03-29 DIAGNOSIS — I10 Essential (primary) hypertension: Secondary | ICD-10-CM | POA: Diagnosis not present

## 2024-03-29 LAB — CBC WITH DIFFERENTIAL/PLATELET
Basophils Absolute: 0.1 x10E3/uL (ref 0.0–0.2)
Basos: 1 %
EOS (ABSOLUTE): 0.3 x10E3/uL (ref 0.0–0.4)
Eos: 2 %
Hematocrit: 36.5 % — ABNORMAL LOW (ref 37.5–51.0)
Hemoglobin: 11.6 g/dL — ABNORMAL LOW (ref 13.0–17.7)
Immature Grans (Abs): 0 x10E3/uL (ref 0.0–0.1)
Immature Granulocytes: 0 %
Lymphocytes Absolute: 2.5 x10E3/uL (ref 0.7–3.1)
Lymphs: 23 %
MCH: 30.3 pg (ref 26.6–33.0)
MCHC: 31.8 g/dL (ref 31.5–35.7)
MCV: 95 fL (ref 79–97)
Monocytes Absolute: 0.8 x10E3/uL (ref 0.1–0.9)
Monocytes: 8 %
Neutrophils Absolute: 7.3 x10E3/uL — ABNORMAL HIGH (ref 1.4–7.0)
Neutrophils: 66 %
Platelets: 280 x10E3/uL (ref 150–450)
RBC: 3.83 x10E6/uL — ABNORMAL LOW (ref 4.14–5.80)
RDW: 13.6 % (ref 11.6–15.4)
WBC: 11 x10E3/uL — ABNORMAL HIGH (ref 3.4–10.8)

## 2024-03-29 MED ORDER — TAMSULOSIN HCL 0.4 MG PO CAPS: 0.4000 mg | ORAL_CAPSULE | Freq: Every day | ORAL | 3 refills | Status: DC

## 2024-03-29 NOTE — Assessment & Plan Note (Signed)
 Continue salt restriction and postural changes.  I urged properly fitted support stockings as well.

## 2024-03-29 NOTE — Assessment & Plan Note (Signed)
 Continue to limit evening fluids.  Trial of Tamsulosin .

## 2024-03-29 NOTE — Progress Notes (Signed)
 Established Patient Office Visit  Subjective   Patient ID: Ronald Dunn, male    DOB: 1931/09/09  Age: 89 y.o. MRN: [id number]  Chief Complaint  Patient presents with   Follow-up    Follow-up visit    F/u as above.  Recently had his mild anemia evaluated and will continue to follow this today.  Could eventually need to see Dr. Ezzard.  He did try the Tramadol  for his OA pain and did get some relief.  Unfortunately has noticed some sedation and was cautioned to perhaps only use it for special occasions.  He is frustrated with worsening nocturnal polyuria.  He is pretty careful with his evening fluids.  He stopped his Furosemide  weeks ago after it didn't really seem to help.  He does a good job of avoiding salt and elevating his legs which clearly helps.    Past Medical History:  Diagnosis Date   CKD (chronic kidney disease) 04/14/2017   Stage 1   Coronary artery disease involving native coronary artery of native heart 04/14/2017   DVT (deep venous thrombosis) (HCC)    Right leg   Gout 04/14/2017   Hyperlipidemia 07/22/2015   Incomplete right bundle branch block (RBBB)    Noted on EKG 01/31/2018   Osteoarthritis    PAF (paroxysmal atrial fibrillation) (HCC) 09/01/2015   f/by Dr. Monetta   Premature ventricular contractions (PVCs) (VPCs) 04/14/2017   Skin cancer (melanoma) (HCC) 04/14/2017   f/by [facility]   Venous insufficiency    Vitamin B 12 deficiency     Outpatient Encounter Medications as of 03/29/2024  Medication Sig   allopurinol  (ZYLOPRIM ) 100 MG tablet Take 1 tablet (100 mg total) by mouth 2 (two) times daily.   aspirin  EC 81 MG tablet Take 1 tablet (81 mg total) by mouth daily. Swallow whole.   cyanocobalamin  (VITAMIN B12) 1000 MCG/ML injection Inject 1,000 mcg into the muscle every 30 (thirty) days.   ezetimibe  (ZETIA ) 10 MG tablet Take 1 tablet (10 mg total) by mouth daily.   Multiple Vitamin (MULTIVITAMIN ADULT) TABS Orally   prednisoLONE acetate (PRED  FORTE) 1 % ophthalmic suspension Place 1 drop into both eyes daily.   Pseudoephedrine-Acetaminophen  (SM NON-ASPRIN SINUS PO) Take by mouth.   tamsulosin  (FLOMAX ) 0.4 MG CAPS capsule Take 1 capsule (0.4 mg total) by mouth daily.   [DISCONTINUED] furosemide  (LASIX ) 20 MG tablet Please take this medication two times weekly as needed   Facility-Administered Encounter Medications as of 03/29/2024  Medication   cyanocobalamin  (VITAMIN B12) injection 1,000 mcg    Social History   Tobacco Use   Smoking status: Never   Smokeless tobacco: Never  Vaping Use   Vaping status: Never Used  Substance Use Topics   Alcohol use: Never    Alcohol/week: 1.0 standard drink of alcohol    Types: 1 Glasses of wine per week   Drug use: No      Review of Systems  Constitutional:  Negative for diaphoresis, fever, malaise/fatigue and weight loss.  Respiratory:  Negative for cough, shortness of breath and wheezing.   Cardiovascular:  Positive for leg swelling. Negative for chest pain, palpitations, orthopnea, claudication and PND.      Objective:     BP 131/76 (BP Location: Right Arm, Patient Position: Standing, Cuff Size: Normal)   Pulse 90   Temp 98 F (36.7 C) (Oral)   Resp 16   Wt 166 lb 6.4 oz (75.5 kg)   SpO2 95%   BMI 26.74 kg/m  Physical Exam Constitutional:      General: He is not in acute distress.    Appearance: Normal appearance.  HENT:     Head: Normocephalic.  Neck:     Vascular: No carotid bruit.  Cardiovascular:     Rate and Rhythm: Normal rate and regular rhythm.     Pulses: Normal pulses.     Heart sounds: Normal heart sounds.  Pulmonary:     Effort: Pulmonary effort is normal.     Breath sounds: Normal breath sounds.  Abdominal:     General: Bowel sounds are normal.     Palpations: Abdomen is soft.  Musculoskeletal:     Cervical back: Neck supple. No tenderness.     Right lower leg: Edema present.     Left lower leg: Edema present.     Comments: Chronic 2-3+  bilateral LE edema.  Mild asymmetry is chronic.  Neurological:     Mental Status: He is alert.      No results found for any visits on 03/29/24.    The ASCVD Risk score (Arnett DK, et al., 2019) failed to calculate for the following reasons:   The 2019 ASCVD risk score is only valid for ages 45 to 32    Assessment & Plan:  Anemia, unspecified type Assessment & Plan: Probably due to chronic disease.  Recent iron panel and SPEP noted.  Monitor and refer to Hematology if his Hg continues to drop.  Orders: -     CBC with Differential/Platelet  Prostate hypertrophy Assessment & Plan: Continue to limit evening fluids.  Trial of Tamsulosin .  Orders: -     Tamsulosin  HCl; Take 1 capsule (0.4 mg total) by mouth daily.  Dispense: 30 capsule; Refill: 3  Venous insufficiency Assessment & Plan: Continue salt restriction and postural changes.  I urged properly fitted support stockings as well.   Essential hypertension Assessment & Plan: Controlled.     Return in about 3 months (around 06/28/2024) for chronic follow-up.    REDDING PONCE NORLEEN FALCON., MD

## 2024-03-29 NOTE — Assessment & Plan Note (Signed)
 Probably due to chronic disease.  Recent iron panel and SPEP noted.  Monitor and refer to Hematology if his Hg continues to drop.

## 2024-03-29 NOTE — Assessment & Plan Note (Signed)
 Controlled.

## 2024-03-30 ENCOUNTER — Ambulatory Visit (HOSPITAL_BASED_OUTPATIENT_CLINIC_OR_DEPARTMENT_OTHER): Payer: Self-pay | Admitting: Family Medicine

## 2024-04-02 ENCOUNTER — Telehealth (HOSPITAL_BASED_OUTPATIENT_CLINIC_OR_DEPARTMENT_OTHER): Payer: Self-pay | Admitting: *Deleted

## 2024-04-11 NOTE — Telephone Encounter (Signed)
Pt. Was called

## 2024-04-12 ENCOUNTER — Ambulatory Visit (HOSPITAL_BASED_OUTPATIENT_CLINIC_OR_DEPARTMENT_OTHER)

## 2024-04-12 DIAGNOSIS — D510 Vitamin B12 deficiency anemia due to intrinsic factor deficiency: Secondary | ICD-10-CM

## 2024-04-12 MED ORDER — CYANOCOBALAMIN 1000 MCG/ML IJ SOLN
1000.0000 ug | Freq: Once | INTRAMUSCULAR | Status: AC
  Administered 2024-04-12: 1000 ug via INTRAMUSCULAR

## 2024-04-12 NOTE — Progress Notes (Signed)
 Patient is in office today for a nurse visit for B12 Injection. Patient Injection was given in the  Left deltoid. Patient tolerated injection well.

## 2024-04-13 ENCOUNTER — Other Ambulatory Visit (HOSPITAL_BASED_OUTPATIENT_CLINIC_OR_DEPARTMENT_OTHER): Payer: Self-pay | Admitting: Family Medicine

## 2024-04-13 DIAGNOSIS — M1991 Primary osteoarthritis, unspecified site: Secondary | ICD-10-CM

## 2024-04-19 ENCOUNTER — Other Ambulatory Visit (HOSPITAL_BASED_OUTPATIENT_CLINIC_OR_DEPARTMENT_OTHER): Payer: Self-pay | Admitting: Family Medicine

## 2024-04-19 ENCOUNTER — Telehealth (HOSPITAL_BASED_OUTPATIENT_CLINIC_OR_DEPARTMENT_OTHER): Payer: Self-pay | Admitting: *Deleted

## 2024-04-19 DIAGNOSIS — M160 Bilateral primary osteoarthritis of hip: Secondary | ICD-10-CM

## 2024-04-19 MED ORDER — TRAMADOL HCL 50 MG PO TABS: 50.0000 mg | ORAL_TABLET | Freq: Two times a day (BID) | ORAL | 0 refills | Status: AC | PRN

## 2024-04-19 NOTE — Telephone Encounter (Signed)
 Copied from CRM #[id number]. Topic: Clinical - Medication Question >> Apr 18, 2024 11:11 AM Willma SAUNDERS wrote: Reason for CRM: Patient calling to check on his refill request for traMADol  (ULTRAM ) 50 MG tablet, that was sent in on 04/13/24. Wants to make sure it gets refilled as he only has a few pills left.  Patient can be reached at [phone]

## 2024-04-24 NOTE — Progress Notes (Signed)
 B12 given

## 2024-05-02 ENCOUNTER — Ambulatory Visit (INDEPENDENT_AMBULATORY_CARE_PROVIDER_SITE_OTHER)

## 2024-05-02 ENCOUNTER — Encounter (HOSPITAL_BASED_OUTPATIENT_CLINIC_OR_DEPARTMENT_OTHER): Payer: Self-pay

## 2024-05-02 VITALS — BP 131/76 | Ht 68.0 in | Wt 167.0 lb

## 2024-05-02 DIAGNOSIS — Z0000 Encounter for general adult medical examination without abnormal findings: Secondary | ICD-10-CM

## 2024-05-02 NOTE — Patient Instructions (Signed)
 Mr. Gamino,  Thank you for taking the time for your Medicare Wellness Visit. I appreciate your continued commitment to your health goals. Please review the care plan we discussed, and feel free to reach out if I can assist you further.  Medicare recommends these wellness visits once per year to help you and your care team stay ahead of potential health issues. These visits are designed to focus on prevention, allowing your provider to concentrate on managing your acute and chronic conditions during your regular appointments.  Please note that Annual Wellness Visits do not include a physical exam. Some assessments may be limited, especially if the visit was conducted virtually. If needed, we may recommend a separate in-person follow-up with your provider.  Ongoing Care Seeing your primary care provider every 3 to 6 months helps us  monitor your health and provide consistent, personalized care.   Referrals If a referral was made during today's visit and you haven't received any updates within two weeks, please contact the referred provider directly to check on the status.  Recommended Screenings:  Health Maintenance  Topic Date Due   DTaP/Tdap/Td vaccine (1 - Tdap) Never done   Pneumococcal Vaccine for age over 56 (1 of 2 - PCV) Never done   Zoster (Shingles) Vaccine (2 of 2) 04/30/2019   Medicare Annual Wellness Visit  11/10/2021   Flu Shot  02/24/2024   Meningitis B Vaccine  Aged Out   COVID-19 Vaccine  Discontinued       05/02/2024    3:25 PM  Advanced Directives  Does Patient Have a Medical Advance Directive? Yes  Type of Estate agent of [place];Living will  Does patient want to make changes to medical advance directive? No - Patient declined  Copy of Healthcare Power of Attorney in Chart? No - copy requested   Advance Care Planning is important because it: Ensures you receive medical care that aligns with your values, goals, and preferences. Provides  guidance to your family and loved ones, reducing the emotional burden of decision-making during critical moments.  Vision: Annual vision screenings are recommended for early detection of glaucoma, cataracts, and diabetic retinopathy. These exams can also reveal signs of chronic conditions such as diabetes and high blood pressure.  Dental: Annual dental screenings help detect early signs of oral cancer, gum disease, and other conditions linked to overall health, including heart disease and diabetes.  Please see the attached documents for additional preventive care recommendations.

## 2024-05-02 NOTE — Progress Notes (Signed)
 Because this visit was a virtual/telehealth visit,  certain criteria was not obtained, such a blood pressure, CBG if applicable, and timed get up and go. Any medications not marked as taking were not mentioned during the medication reconciliation part of the visit. Any vitals not documented were not able to be obtained due to this being a telehealth visit or patient was unable to self-report a recent blood pressure reading due to a lack of equipment at home via telehealth. Vitals that have been documented are verbally provided by the patient.  This visit was performed by a medical professional under my direct supervision. I was immediately available for consultation/collaboration. I have reviewed and agree with the Annual Wellness Visit documentation.  Subjective:   Ronald Dunn is a 89 y.o. who presents for a Medicare Wellness preventive visit.  As a reminder, Annual Wellness Visits don't include a physical exam, and some assessments may be limited, especially if this visit is performed virtually. We may recommend an in-person follow-up visit with your provider if needed.  Visit Complete: Virtual I connected with  Ronald Dunn on 05/02/24 by a audio enabled telemedicine application and verified that I am speaking with the correct person using two identifiers.  Patient Location: Home  Provider Location: Home Office  I discussed the limitations of evaluation and management by telemedicine. The patient expressed understanding and agreed to proceed.  Vital Signs: Because this visit was a virtual/telehealth visit, some criteria may be missing or patient reported. Any vitals not documented were not able to be obtained and vitals that have been documented are patient reported.  VideoDeclined- This patient declined Librarian, academic. Therefore the visit was completed with audio only.  Persons Participating in Visit: Patient.  AWV Questionnaire:patient did not  complete questionnaire prior to visit. Patient information was completed by [facility]   Cardiac Risk Factors include: advanced age (>47men, >68 women);male gender;hypertension;dyslipidemia     Objective:    Today's Vitals   05/02/24 1520 05/02/24 1521  BP: 131/76   Weight: 167 lb (75.8 kg)   Height: 5' 8 (1.727 m)   PainSc:  0-No pain   Body mass index is 25.39 kg/m.     05/02/2024    3:25 PM 11/08/2022    2:25 PM 01/14/2021    8:30 PM 01/12/2021   11:34 AM 08/14/2018    4:30 PM 08/14/2018    9:12 AM 08/08/2018    2:03 PM  Advanced Directives  Does Patient Have a Medical Advance Directive? Yes Yes Yes Yes Yes  Yes  Yes   Type of Estate agent of [place];Living will  Healthcare Power of [place];Living will Healthcare Power of [place];Living will Healthcare Power of [place];Living will Healthcare Power of [place];Living will Healthcare Power of [place];Living will  Does patient want to make changes to medical advance directive? No - Patient declined No - Patient declined No - Patient declined  No - Patient declined  No - Patient declined  No - Patient declined   Copy of Healthcare Power of Attorney in Chart? No - copy requested  No - copy requested   No - copy requested  No - copy requested      Data saved with a previous flowsheet row definition    Current Medications (verified) Outpatient Encounter Medications as of 05/02/2024  Medication Sig   allopurinol  (ZYLOPRIM ) 100 MG tablet Take 1 tablet (100 mg total) by mouth 2 (two) times daily.   aspirin  EC 81 MG tablet Take  1 tablet (81 mg total) by mouth daily. Swallow whole.   cyanocobalamin  (VITAMIN B12) 1000 MCG/ML injection Inject 1,000 mcg into the muscle every 30 (thirty) days.   ezetimibe  (ZETIA ) 10 MG tablet Take 1 tablet (10 mg total) by mouth daily.   Multiple Vitamin (MULTIVITAMIN ADULT) TABS Orally   prednisoLONE acetate (PRED FORTE) 1 % ophthalmic suspension Place 1 drop into both eyes daily.    Pseudoephedrine-Acetaminophen  (SM NON-ASPRIN SINUS PO) Take by mouth.   tamsulosin  (FLOMAX ) 0.4 MG CAPS capsule Take 1 capsule (0.4 mg total) by mouth daily.   traMADol  (ULTRAM ) 50 MG tablet Take 1 tablet (50 mg total) by mouth every 12 (twelve) hours as needed.   Facility-Administered Encounter Medications as of 05/02/2024  Medication   cyanocobalamin  (VITAMIN B12) injection 1,000 mcg    Allergies (verified) Patient has no known allergies.   History: Past Medical History:  Diagnosis Date   CKD (chronic kidney disease) 04/14/2017   Stage 1   Coronary artery disease involving native coronary artery of native heart 04/14/2017   DVT (deep venous thrombosis) (HCC)    Right leg   Gout 04/14/2017   Hyperlipidemia 07/22/2015   Incomplete right bundle branch block (RBBB)    Noted on EKG 01/31/2018   Osteoarthritis    PAF (paroxysmal atrial fibrillation) (HCC) 09/01/2015   f/by Dr. Monetta   Premature ventricular contractions (PVCs) (VPCs) 04/14/2017   Skin cancer (melanoma) (HCC) 04/14/2017   f/by [facility]   Venous insufficiency    Vitamin B 12 deficiency    Past Surgical History:  Procedure Laterality Date   CARDIAC CATHETERIZATION  09/2015   COLONOSCOPY     CORNEAL TRANSPLANT Bilateral    CORONARY ARTERY BYPASS GRAFT  08/26/2015   FOOT SURGERY Right    TOTAL HIP ARTHROPLASTY Right 01/14/2021   Procedure: TOTAL HIP ARTHROPLASTY ANTERIOR APPROACH;  Surgeon: Melodi Lerner, MD;  Location: [facility];  Service: Orthopedics;  Laterality: Right;   TOTAL KNEE ARTHROPLASTY Right 08/14/2018   Procedure: TOTAL KNEE ARTHROPLASTY;  Surgeon: Rubie Kemps, MD;  Location: [facility];  Service: Orthopedics;  Laterality: Right;   TOTAL KNEE ARTHROPLASTY Left 11/08/2022   Procedure: TOTAL KNEE ARTHROPLASTY;  Surgeon: Melodi Lerner, MD;  Location: [facility];  Service: Orthopedics;  Laterality: Left;   Family History  Problem Relation Age of Onset   Heart failure Father    Heart disease Father     Social History   Socioeconomic History   Marital status: Married    Spouse name: Not on file   Number of children: Not on file   Years of education: Not on file   Highest education level: Not on file  Occupational History   Not on file  Tobacco Use   Smoking status: Never   Smokeless tobacco: Never  Vaping Use   Vaping status: Never Used  Substance and Sexual Activity   Alcohol use: Never    Alcohol/week: 1.0 standard drink of alcohol    Types: 1 Glasses of wine per week   Drug use: No   Sexual activity: Not on file  Other Topics Concern   Not on file  Social History Narrative   Not on file   Social Drivers of Health   Financial Resource Strain: Patient Declined (05/02/2024)   Overall Financial Resource Strain (CARDIA)    Difficulty of Paying Living Expenses: Patient declined  Food Insecurity: No Food Insecurity (05/02/2024)   Hunger Vital Sign    Worried About Running Out of Food in the  Last Year: Never true    Ran Out of Food in the Last Year: Never true  Transportation Needs: No Transportation Needs (05/02/2024)   PRAPARE - Administrator, Civil Service (Medical): No    Lack of Transportation (Non-Medical): No  Physical Activity: Sufficiently Active (05/02/2024)   Exercise Vital Sign    Days of Exercise per Week: 7 days    Minutes of Exercise per Session: 30 min  Stress: No Stress Concern Present (05/02/2024)   [employer] of Occupational Health - Occupational Stress Questionnaire    Feeling of Stress: Not at all  Social Connections: Moderately Integrated (05/02/2024)   Social Connection and Isolation Panel    Frequency of Communication with Friends and Family: More than three times a week    Frequency of Social Gatherings with Friends and Family: More than three times a week    Attends Religious Services: More than 4 times per year    Active Member of [employer] or Organizations: Yes    Attends Banker Meetings: More than 4 times per  year    Marital Status: Widowed    Tobacco Counseling Counseling given: Not Answered    Clinical Intake:  Pre-visit preparation completed: Yes  Pain : No/denies pain Pain Score: 0-No pain     BMI - recorded: 25.39 Nutritional Status: BMI 25 -29 Overweight Nutritional Risks: None Diabetes: No  No results found for: HGBA1C   How often do you need to have someone help you when you read instructions, pamphlets, or other written materials from your doctor or pharmacy?: 1 - Never  Interpreter Needed?: No  Information entered by :: Caylynn Minchew,CMA   Activities of Daily Living     05/02/2024    3:24 PM  In your present state of health, do you have any difficulty performing the following activities:  Hearing? 0  Vision? 0  Difficulty concentrating or making decisions? 0  Walking or climbing stairs? 0  Dressing or bathing? 0  Doing errands, shopping? 0  Preparing Food and eating ? N  Using the Toilet? N  In the past six months, have you accidently leaked urine? N  Do you have problems with loss of bowel control? N  Managing your Medications? N  Managing your Finances? N  Housekeeping or managing your Housekeeping? N    Patient Care Team: Dottie Norleen PHEBE PONCE, MD as PCP - General (Family Medicine) Monetta Redell PARAS, MD as PCP - Cardiology (Cardiology)  I have updated your Care Teams any recent Medical Services you may have received from other providers in the past year.     Assessment:   This is a routine wellness examination for Ronald Dunn.  Hearing/Vision screen Hearing Screening - Comments:: No difficulties Vision Screening - Comments:: Patient wears glasses    Goals Addressed             This Visit's Progress    Patient Stated       Patient would like to keep living        Depression Screen     05/02/2024    3:26 PM 02/02/2024   10:32 AM  PHQ 2/9 Scores  PHQ - 2 Score 0 1  PHQ- 9 Score 0 2    Fall Risk     05/02/2024    3:25 PM 02/02/2024    10:32 AM 06/13/2018    3:12 PM  Fall Risk   Falls in the past year? 0 0 1   Comment  Emmi Telephone Survey: data to providers prior to load   Number falls in past yr: 0 0 1   Comment   Emmi Telephone Survey Actual Response = 4   Injury with Fall? 0 0 0  Risk for fall due to : No Fall Risks No Fall Risks   Follow up Falls evaluation completed Falls evaluation completed      Data saved with a previous flowsheet row definition    MEDICARE RISK AT HOME:  Medicare Risk at Home Any stairs in or around the home?: No If so, are there any without handrails?: No Home free of loose throw rugs in walkways, pet beds, electrical cords, etc?: Yes Adequate lighting in your home to reduce risk of falls?: Yes Life alert?: No Use of a cane, walker or w/c?: No Grab bars in the bathroom?: Yes Shower chair or bench in shower?: Yes Elevated toilet seat or a handicapped toilet?: Yes  TIMED UP AND GO:  Was the test performed?  No  Cognitive Function: 6CIT completed        05/02/2024    3:22 PM  6CIT Screen  What Year? 0 points  What month? 0 points  What time? 0 points  Count back from 20 0 points  Months in reverse 0 points  Repeat phrase 0 points  Total Score 0 points    Immunizations Immunization History  Administered Date(s) Administered   Influenza, Quadrivalent, Recombinant, Inj, Pf 05/08/2021, 06/01/2023   Influenza-Unspecified 04/26/2015   Zoster Recombinant(Shingrix) 03/05/2019    Screening Tests Health Maintenance  Topic Date Due   DTaP/Tdap/Td (1 - Tdap) Never done   Pneumococcal Vaccine: 50+ Years (1 of 2 - PCV) Never done   Zoster Vaccines- Shingrix (2 of 2) 04/30/2019   Medicare Annual Wellness (AWV)  11/10/2021   Influenza Vaccine  02/24/2024   Meningococcal B Vaccine  Aged Out   COVID-19 Vaccine  Discontinued    Health Maintenance Items Addressed:patient declined   Additional Screening:  Vision Screening: Recommended annual ophthalmology exams for early  detection of glaucoma and other disorders of the eye. Is the patient up to date with their annual eye exam?  No  Who is the provider or what is the name of the office in which the patient attends annual eye exams?   Dental Screening: Recommended annual dental exams for proper oral hygiene  Community Resource Referral / Chronic Care Management: CRR required this visit?  No   CCM required this visit?  No   Plan:    I have personally reviewed and noted the following in the patient's chart:   Medical and social history Use of alcohol, tobacco or illicit drugs  Current medications and supplements including opioid prescriptions. Patient is not currently taking opioid prescriptions. Functional ability and status Nutritional status Physical activity Advanced directives List of other physicians Hospitalizations, surgeries, and ER visits in previous 12 months Vitals Screenings to include cognitive, depression, and falls Referrals and appointments  In addition, I have reviewed and discussed with patient certain preventive protocols, quality metrics, and best practice recommendations. A written personalized care plan for preventive services as well as general preventive health recommendations were provided to patient.   Ronald Dunn Right, [place]   05/02/2024   After Visit Summary: (MyChart) Due to this being a telephonic visit, the after visit summary with patients personalized plan was offered to patient via MyChart   Notes: Nothing significant to report at this time.

## 2024-05-11 ENCOUNTER — Ambulatory Visit (INDEPENDENT_AMBULATORY_CARE_PROVIDER_SITE_OTHER)

## 2024-05-11 DIAGNOSIS — D510 Vitamin B12 deficiency anemia due to intrinsic factor deficiency: Secondary | ICD-10-CM

## 2024-05-11 DIAGNOSIS — Z23 Encounter for immunization: Secondary | ICD-10-CM

## 2024-05-11 MED ORDER — CYANOCOBALAMIN 1000 MCG/ML IJ SOLN
1000.0000 ug | Freq: Once | INTRAMUSCULAR | Status: AC
  Administered 2024-05-11: 1000 ug via INTRAMUSCULAR

## 2024-05-11 NOTE — Progress Notes (Signed)
 Patient is in office today for a nurse visit for B12 Injection. Patient Injection was given in the  Right arm. Patient tolerated injection well.

## 2024-05-30 ENCOUNTER — Other Ambulatory Visit: Payer: Self-pay | Admitting: Cardiology

## 2024-06-11 ENCOUNTER — Ambulatory Visit (INDEPENDENT_AMBULATORY_CARE_PROVIDER_SITE_OTHER): Admitting: *Deleted

## 2024-06-11 DIAGNOSIS — D510 Vitamin B12 deficiency anemia due to intrinsic factor deficiency: Secondary | ICD-10-CM | POA: Diagnosis not present

## 2024-06-11 MED ORDER — CYANOCOBALAMIN 1000 MCG/ML IJ SOLN
1000.0000 ug | Freq: Once | INTRAMUSCULAR | Status: AC
  Administered 2024-06-11: 1000 ug via INTRAMUSCULAR

## 2024-06-11 NOTE — Progress Notes (Unsigned)
 Patient is in office today for a nurse visit for B12 Injection. Patient Injection was given in the  Left deltoid. Patient tolerated injection well.

## 2024-06-28 ENCOUNTER — Ambulatory Visit (HOSPITAL_BASED_OUTPATIENT_CLINIC_OR_DEPARTMENT_OTHER): Admitting: Family Medicine

## 2024-06-28 ENCOUNTER — Other Ambulatory Visit (HOSPITAL_BASED_OUTPATIENT_CLINIC_OR_DEPARTMENT_OTHER): Payer: Self-pay | Admitting: Family Medicine

## 2024-06-28 ENCOUNTER — Encounter (HOSPITAL_BASED_OUTPATIENT_CLINIC_OR_DEPARTMENT_OTHER): Payer: Self-pay | Admitting: Family Medicine

## 2024-06-28 VITALS — BP 130/81 | HR 102 | Temp 98.1°F | Resp 16 | Wt 166.6 lb

## 2024-06-28 DIAGNOSIS — N400 Enlarged prostate without lower urinary tract symptoms: Secondary | ICD-10-CM

## 2024-06-28 DIAGNOSIS — D638 Anemia in other chronic diseases classified elsewhere: Secondary | ICD-10-CM | POA: Diagnosis not present

## 2024-06-28 DIAGNOSIS — N1831 Chronic kidney disease, stage 3a: Secondary | ICD-10-CM

## 2024-06-28 DIAGNOSIS — I10 Essential (primary) hypertension: Secondary | ICD-10-CM

## 2024-06-28 DIAGNOSIS — M160 Bilateral primary osteoarthritis of hip: Secondary | ICD-10-CM

## 2024-06-28 NOTE — Progress Notes (Signed)
 Established Patient Office Visit  Subjective   Patient ID: Ronald Dunn, male    DOB: Jul 05, 1932  Age: 89 y.o. MRN: [id number]  Chief Complaint  Patient presents with   Follow-up    Follow-up    F/u as above.  I'm pleased to hear that his nocturnal polyuria resolved on its own w/o him needing the Tamsulosin .  Occasional chest pain at rest also noted.  He remains very active and never has any chest pain or pressure when walking up stairs, etc.  We are both suspicious that his recent atypical pain may be related to his work on the [employer] at [employer].  No concerns about GERD or anxiety.  Somewhat recent labs were pretty reassuring.     History of Present Illness     Past Medical History:  Diagnosis Date   Anemia of chronic disease    CKD (chronic kidney disease) 04/14/2017   Stage 1   Coronary artery disease involving native coronary artery of native heart 04/14/2017   DVT (deep venous thrombosis) (HCC)    Right leg   Gout 04/14/2017   Hyperlipidemia 07/22/2015   Incomplete right bundle branch block (RBBB)    Noted on EKG 01/31/2018   Osteoarthritis    PAF (paroxysmal atrial fibrillation) (HCC) 09/01/2015   f/by Dr. Monetta   Premature ventricular contractions (PVCs) (VPCs) 04/14/2017   Skin cancer (melanoma) (HCC) 04/14/2017   f/by  Dermatology   Venous insufficiency    Vitamin B 12 deficiency     Outpatient Encounter Medications as of 06/28/2024  Medication Sig   allopurinol  (ZYLOPRIM ) 100 MG tablet Take 1 tablet (100 mg total) by mouth 2 (two) times daily.   aspirin  EC 81 MG tablet Take 1 tablet (81 mg total) by mouth daily. Swallow whole.   cyanocobalamin  (VITAMIN B12) 1000 MCG/ML injection Inject 1,000 mcg into the muscle every 30 (thirty) days.   ezetimibe  (ZETIA ) 10 MG tablet Take 1 tablet by mouth once daily   Multiple Vitamin (MULTIVITAMIN ADULT) TABS Orally   prednisoLONE acetate (PRED FORTE) 1 % ophthalmic suspension Place 1 drop into both  eyes daily.   [DISCONTINUED] Pseudoephedrine-Acetaminophen  (SM NON-ASPRIN SINUS PO) Take by mouth.   [DISCONTINUED] tamsulosin  (FLOMAX ) 0.4 MG CAPS capsule Take 1 capsule (0.4 mg total) by mouth daily.   Facility-Administered Encounter Medications as of 06/28/2024  Medication   cyanocobalamin  (VITAMIN B12) injection 1,000 mcg    Social History   Tobacco Use   Smoking status: Never   Smokeless tobacco: Never  Vaping Use   Vaping status: Never Used  Substance Use Topics   Alcohol use: Never    Alcohol/week: 1.0 standard drink of alcohol    Types: 1 Glasses of wine per week   Drug use: No      Review of Systems  Constitutional:  Negative for diaphoresis, fever, malaise/fatigue and weight loss.  Respiratory:  Negative for cough, shortness of breath and wheezing.   Cardiovascular:  Negative for chest pain, palpitations, orthopnea, claudication, leg swelling and PND.      Objective:     BP 130/81 (BP Location: Right Arm, Patient Position: Standing, Cuff Size: Normal)   Pulse (!) 102   Temp 98.1 F (36.7 C) (Oral)   Resp 16   Wt 166 lb 9.6 oz (75.6 kg)   SpO2 93%   BMI 25.33 kg/m    Physical Exam Constitutional:      General: He is not in acute distress.    Appearance: Normal  appearance.  HENT:     Head: Normocephalic.  Neck:     Vascular: No carotid bruit.  Cardiovascular:     Rate and Rhythm: Normal rate and regular rhythm.     Pulses: Normal pulses.     Heart sounds: Murmur heard.  Pulmonary:     Effort: Pulmonary effort is normal.     Breath sounds: Normal breath sounds.  Abdominal:     General: Bowel sounds are normal.     Palpations: Abdomen is soft.  Musculoskeletal:     Cervical back: Neck supple. No tenderness.     Right lower leg: No edema.     Left lower leg: No edema.  Neurological:     Mental Status: He is alert.      No results found for any visits on 06/28/24.    The ASCVD Risk score (Arnett DK, et al., 2019) failed to calculate for  the following reasons:   The 2019 ASCVD risk score is only valid for ages 26 to 56    Assessment & Plan:  Stage 3a chronic kidney disease (HCC) Assessment & Plan: Stable.  Avoid NSAIDS.   Essential hypertension Assessment & Plan: Satisfactory control.   Prostatic hypertrophy Assessment & Plan: Now reassuring history as above.   Anemia of chronic disease Assessment & Plan: Repeat labs in a few months.     Assessment & Plan      Return in about 3 months (around 09/26/2024) for chronic follow-up.    REDDING PONCE NORLEEN FALCON., MD

## 2024-06-28 NOTE — Assessment & Plan Note (Signed)
Satisfactory control. 

## 2024-06-28 NOTE — Assessment & Plan Note (Signed)
Repeat labs in a few months

## 2024-06-28 NOTE — Assessment & Plan Note (Signed)
Stable. Avoid NSAID'S.

## 2024-06-28 NOTE — Assessment & Plan Note (Signed)
 Now reassuring history as above.

## 2024-07-04 ENCOUNTER — Other Ambulatory Visit (HOSPITAL_BASED_OUTPATIENT_CLINIC_OR_DEPARTMENT_OTHER): Payer: Self-pay | Admitting: Family Medicine

## 2024-07-04 DIAGNOSIS — M169 Osteoarthritis of hip, unspecified: Secondary | ICD-10-CM

## 2024-07-04 MED ORDER — TRAMADOL HCL 50 MG PO TABS: 50.0000 mg | ORAL_TABLET | Freq: Every day | ORAL | 0 refills | Status: DC | PRN

## 2024-07-05 ENCOUNTER — Encounter (HOSPITAL_BASED_OUTPATIENT_CLINIC_OR_DEPARTMENT_OTHER): Payer: Self-pay | Admitting: *Deleted

## 2024-07-10 DIAGNOSIS — K080 Exfoliation of teeth due to systemic causes: Secondary | ICD-10-CM | POA: Diagnosis not present

## 2024-07-12 ENCOUNTER — Ambulatory Visit (INDEPENDENT_AMBULATORY_CARE_PROVIDER_SITE_OTHER)

## 2024-07-12 DIAGNOSIS — D510 Vitamin B12 deficiency anemia due to intrinsic factor deficiency: Secondary | ICD-10-CM

## 2024-07-12 MED ORDER — CYANOCOBALAMIN 1000 MCG/ML IJ SOLN
1000.0000 ug | Freq: Once | INTRAMUSCULAR | Status: AC
  Administered 2024-07-12: 10:00:00 1000 ug via INTRAMUSCULAR

## 2024-07-12 NOTE — Progress Notes (Unsigned)
 Patient is in office today for a nurse visit for B12 Injection. Patient Injection was given in the  Left deltoid. Patient tolerated injection well.

## 2024-07-29 ENCOUNTER — Other Ambulatory Visit (HOSPITAL_BASED_OUTPATIENT_CLINIC_OR_DEPARTMENT_OTHER): Payer: Self-pay | Admitting: Family Medicine

## 2024-07-29 DIAGNOSIS — M169 Osteoarthritis of hip, unspecified: Secondary | ICD-10-CM

## 2024-08-13 ENCOUNTER — Ambulatory Visit (HOSPITAL_BASED_OUTPATIENT_CLINIC_OR_DEPARTMENT_OTHER)

## 2024-08-15 ENCOUNTER — Ambulatory Visit (HOSPITAL_BASED_OUTPATIENT_CLINIC_OR_DEPARTMENT_OTHER)

## 2024-08-15 DIAGNOSIS — D510 Vitamin B12 deficiency anemia due to intrinsic factor deficiency: Secondary | ICD-10-CM | POA: Diagnosis not present

## 2024-08-15 MED ORDER — CYANOCOBALAMIN 1000 MCG/ML IJ SOLN
1000.0000 ug | Freq: Once | INTRAMUSCULAR | Status: AC
  Administered 2024-08-15: 1000 ug via INTRAMUSCULAR

## 2024-08-15 NOTE — Progress Notes (Signed)
 Patient is in office today for a nurse visit for B12 Injection. Patient Injection was given in the  Left deltoid. Patient tolerated injection well.

## 2024-08-27 ENCOUNTER — Other Ambulatory Visit (HOSPITAL_BASED_OUTPATIENT_CLINIC_OR_DEPARTMENT_OTHER): Payer: Self-pay | Admitting: Family Medicine

## 2024-08-27 DIAGNOSIS — M169 Osteoarthritis of hip, unspecified: Secondary | ICD-10-CM

## 2024-09-20 ENCOUNTER — Ambulatory Visit (HOSPITAL_BASED_OUTPATIENT_CLINIC_OR_DEPARTMENT_OTHER)

## 2024-09-27 ENCOUNTER — Ambulatory Visit (HOSPITAL_BASED_OUTPATIENT_CLINIC_OR_DEPARTMENT_OTHER): Admitting: Family Medicine
